# Patient Record
Sex: Male | Born: 1970
Health system: Southern US, Community
[De-identification: ages and names within clinical notes are randomized; demographics above are authoritative.]

## PROBLEM LIST (undated history)

## (undated) DIAGNOSIS — B59 Pneumocystosis: Secondary | ICD-10-CM

## (undated) DIAGNOSIS — A6000 Herpesviral infection of urogenital system, unspecified: Secondary | ICD-10-CM

## (undated) DIAGNOSIS — Z21 Asymptomatic human immunodeficiency virus [HIV] infection status: Secondary | ICD-10-CM

## (undated) DIAGNOSIS — B20 Human immunodeficiency virus [HIV] disease: Secondary | ICD-10-CM

## (undated) DIAGNOSIS — I10 Essential (primary) hypertension: Secondary | ICD-10-CM

## (undated) HISTORY — DX: Human immunodeficiency virus (HIV) disease: B20

## (undated) HISTORY — PX: ANAL EXAMINATION UNDER ANESTHESIA: SHX1138

## (undated) HISTORY — DX: Essential (primary) hypertension: I10

## (undated) HISTORY — DX: Pneumocystosis: B59

---

## 2002-05-27 ENCOUNTER — Emergency Department (HOSPITAL_COMMUNITY): Admission: EM | Admit: 2002-05-27 | Discharge: 2002-05-27 | Payer: Self-pay | Admitting: *Deleted

## 2002-05-30 ENCOUNTER — Emergency Department (HOSPITAL_COMMUNITY): Admission: EM | Admit: 2002-05-30 | Discharge: 2002-05-30 | Payer: Self-pay | Admitting: Internal Medicine

## 2002-06-03 ENCOUNTER — Emergency Department (HOSPITAL_COMMUNITY): Admission: EM | Admit: 2002-06-03 | Discharge: 2002-06-03 | Payer: Self-pay | Admitting: *Deleted

## 2002-06-10 ENCOUNTER — Emergency Department (HOSPITAL_COMMUNITY): Admission: EM | Admit: 2002-06-10 | Discharge: 2002-06-10 | Payer: Self-pay | Admitting: *Deleted

## 2002-06-24 ENCOUNTER — Emergency Department (HOSPITAL_COMMUNITY): Admission: EM | Admit: 2002-06-24 | Discharge: 2002-06-24 | Payer: Self-pay | Admitting: Emergency Medicine

## 2003-08-17 ENCOUNTER — Emergency Department (HOSPITAL_COMMUNITY): Admission: EM | Admit: 2003-08-17 | Discharge: 2003-08-17 | Payer: Self-pay | Admitting: Emergency Medicine

## 2004-06-16 ENCOUNTER — Emergency Department (HOSPITAL_COMMUNITY): Admission: EM | Admit: 2004-06-16 | Discharge: 2004-06-16 | Payer: Self-pay | Admitting: Family Medicine

## 2004-06-17 ENCOUNTER — Emergency Department (HOSPITAL_COMMUNITY): Admission: EM | Admit: 2004-06-17 | Discharge: 2004-06-17 | Payer: Self-pay | Admitting: Family Medicine

## 2004-10-23 ENCOUNTER — Emergency Department (HOSPITAL_COMMUNITY): Admission: EM | Admit: 2004-10-23 | Discharge: 2004-10-23 | Payer: Self-pay | Admitting: Family Medicine

## 2005-06-21 ENCOUNTER — Emergency Department (HOSPITAL_COMMUNITY): Admission: EM | Admit: 2005-06-21 | Discharge: 2005-06-21 | Payer: Self-pay | Admitting: Emergency Medicine

## 2005-06-23 ENCOUNTER — Encounter: Admission: RE | Admit: 2005-06-23 | Discharge: 2005-06-23 | Payer: Self-pay | Admitting: Family Medicine

## 2006-09-10 ENCOUNTER — Encounter: Admission: RE | Admit: 2006-09-10 | Discharge: 2006-09-10 | Payer: Self-pay | Admitting: Family Medicine

## 2012-01-25 ENCOUNTER — Encounter (HOSPITAL_COMMUNITY): Payer: Self-pay | Admitting: *Deleted

## 2012-01-25 ENCOUNTER — Emergency Department (HOSPITAL_COMMUNITY): Payer: BC Managed Care – PPO

## 2012-01-25 ENCOUNTER — Emergency Department (HOSPITAL_COMMUNITY)
Admission: EM | Admit: 2012-01-25 | Discharge: 2012-01-25 | Disposition: A | Discharge status: Home / Self Care | Payer: BC Managed Care – PPO | Attending: Emergency Medicine | Admitting: Emergency Medicine

## 2012-01-25 DIAGNOSIS — K612 Anorectal abscess: Secondary | ICD-10-CM | POA: Insufficient documentation

## 2012-01-25 DIAGNOSIS — F17200 Nicotine dependence, unspecified, uncomplicated: Secondary | ICD-10-CM | POA: Insufficient documentation

## 2012-01-25 DIAGNOSIS — K611 Rectal abscess: Secondary | ICD-10-CM

## 2012-01-25 HISTORY — DX: Herpesviral infection of urogenital system, unspecified: A60.00

## 2012-01-25 LAB — CBC
MCH: 33.3 pg (ref 26.0–34.0)
MCHC: 36.8 g/dL — ABNORMAL HIGH (ref 30.0–36.0)
RDW: 13 % (ref 11.5–15.5)

## 2012-01-25 LAB — BASIC METABOLIC PANEL
Calcium: 9.6 mg/dL (ref 8.4–10.5)
GFR calc Af Amer: >90 mL/min (ref >90)
GFR calc non Af Amer: >90 mL/min (ref >90)
Potassium: 4 mEq/L (ref 3.5–5.1)
Sodium: 136 mEq/L (ref 135–145)

## 2012-01-25 MED ORDER — AMOXICILLIN-POT CLAVULANATE 875-125 MG PO TABS: 1.0000 | ORAL_TABLET | Freq: Two times a day (BID) | ORAL | Status: DC

## 2012-01-25 MED ORDER — IOHEXOL 300 MG/ML  SOLN
100.0000 mL | Freq: Once | INTRAMUSCULAR | Status: AC | PRN
  Administered 2012-01-25: 100 mL via INTRAVENOUS

## 2012-01-25 MED ORDER — SODIUM CHLORIDE 0.9 % IV SOLN
1.0000 g | INTRAVENOUS | Status: DC
  Administered 2012-01-25: 1 g via INTRAVENOUS
  Filled 2012-01-25: qty 1

## 2012-01-25 MED ORDER — METRONIDAZOLE 500 MG PO TABS: 500.0000 mg | ORAL_TABLET | Freq: Two times a day (BID) | ORAL | Status: DC

## 2012-01-25 MED ORDER — METRONIDAZOLE 500 MG PO TABS: 500.0000 mg | ORAL_TABLET | Freq: Two times a day (BID) | ORAL | Status: AC

## 2012-01-25 MED ORDER — MORPHINE SULFATE 4 MG/ML IJ SOLN
4.0000 mg | Freq: Once | INTRAMUSCULAR | Status: AC
  Administered 2012-01-25: 4 mg via INTRAVENOUS
  Filled 2012-01-25: qty 1

## 2012-01-25 MED ORDER — CIPROFLOXACIN HCL 500 MG PO TABS: 500.0000 mg | ORAL_TABLET | Freq: Two times a day (BID) | ORAL | Status: DC

## 2012-01-25 MED ORDER — CIPROFLOXACIN HCL 500 MG PO TABS: 500.0000 mg | ORAL_TABLET | Freq: Two times a day (BID) | ORAL | Status: AC

## 2012-01-25 MED ORDER — HYDROCODONE-ACETAMINOPHEN 5-325 MG PO TABS: 1.0000 | ORAL_TABLET | Freq: Four times a day (QID) | ORAL | Status: AC | PRN

## 2012-01-25 NOTE — ED Notes (Signed)
Pt states went to see PCP 7/31 for rectal pain, was diagnosed w/ hemorrhoid, was given Anusol 2.5% cream and Indomethacin to take and was told to come back if pain didn't decrease in 24 hours, pt states still having severe rectal pain, states when got out of the truck to come inside the ED, he felt something pop and wet, white discharge in underwear and clear/bloody substance on pants.

## 2012-01-25 NOTE — Progress Notes (Signed)
Pt confirmed pcp is elaine griffin updated EPIC

## 2012-01-25 NOTE — ED Provider Notes (Signed)
I spoke to general surgeon who agrees with dispo.  Per RN, pt reports Augmentin causes diarrhea, therefore have changed to Cipro and Flagyl Rx.  Casey Hurley. [name], MD 01/25/12 1739

## 2012-01-25 NOTE — ED Provider Notes (Addendum)
History     CSN: [id number]  Arrival date & time 01/25/12  1254   First MD Initiated Contact with Patient 01/25/12 1416      Chief Complaint  Patient presents with  . Rectal Pain     The history is provided by the patient.  Pt has been having pain in the rectum for the last few days.  He saw his PCP yesterday who prescribed anusol cream and told the patient to come to the ED if it got worse.  Pt states the swelling increased and this am he felt it burst and and noticed yellow discharge in his underwear.  Pt felt feverish today.  No vomiting or diarrhea.  Palpation increases the pain.  Past Medical History  Diagnosis Date  . Genital herpes     History reviewed. No pertinent past surgical history.  History reviewed. No pertinent family history.  History  Substance Use Topics  . Smoking status: Current Everyday Smoker -- 1.0 packs/day    Types: Cigarettes  . Smokeless tobacco: Never Used  . Alcohol Use: 1.2 oz/week    2 Cans of beer per week     daily      Review of Systems  All other systems reviewed and are negative.    Allergies  Review of patient's allergies indicates no known allergies.  Home Medications   Current Outpatient Rx  Name Route Sig Dispense Refill  . HYDROCORTISONE 2.5 % RE CREA Rectal Place 1 application rectally 3 (three) times daily.    . INDOMETHACIN 50 MG PO CAPS Oral Take 50 mg by mouth 3 (three) times daily with meals.    Marland Kitchen VALACYCLOVIR HCL 500 MG PO TABS Oral Take 500 mg by mouth daily.      BP 108/65  Pulse 105  Temp 99.5 F (37.5 C) (Oral)  Resp 16  Ht 5\' 10"  (1.778 m)  Wt 141 lb (63.957 kg)  BMI 20.23 kg/m2  SpO2 100%  Physical Exam  Nursing note and vitals reviewed. Constitutional: He appears well-developed and well-nourished. No distress.  HENT:  Head: Normocephalic and atraumatic.  Right Ear: External ear normal.  Left Ear: External ear normal.  Eyes: Conjunctivae are normal. Right eye exhibits no discharge. Left eye  exhibits no discharge. No scleral icterus.  Neck: Neck supple. No tracheal deviation present.  Cardiovascular: Normal rate.   Murmur heard. Pulmonary/Chest: Effort normal and breath sounds normal. No stridor. No respiratory distress. He has no wheezes.  Abdominal: He exhibits no distension. There is no tenderness. There is no rebound and no guarding.  Genitourinary: Rectal exam shows no fissure.          Area of swelling and tenderness, no fluctuance, small amount of purulent material extenally  Musculoskeletal: He exhibits no edema.  Neurological: He is alert. Cranial nerve deficit: no gross deficits.  Skin: Skin is warm and dry. No rash noted.  Psychiatric: He has a normal mood and affect.    ED Course  Procedures (including critical care time)  Labs Reviewed  CBC - Abnormal; Notable for the following:    WBC 10.9 (*)     MCHC 36.8 (*)     All other components within normal limits  BASIC METABOLIC PANEL - Abnormal; Notable for the following:    Glucose, Bld 100 (*)     All other components within normal limits   Ct Pelvis W Contrast  01/25/2012  *RADIOLOGY REPORT*  Clinical Data:  Rectal pain, drainage  CT PELVIS WITH CONTRAST  Technique:  Multidetector CT imaging of the pelvis was performed using the standard protocol following the bolus administration of intravenous contrast.  Contrast: OMNIPAQUE IOHEXOL 300 MG/ML  SOLN  Comparison:   Prior CT scan of the abdomen and pelvis 08/24/2008.  Findings:  The visualized small and large bowel demonstrate a normal caliber without evidence of dilatation.  Normal appendix identified in the right lower quadrant.  The bladder is distended with urine.  No free fluid, or suspicious pelvic adenopathy. Prostate gland is unremarkable in appearance.  The rectum is unremarkable.  There is asymmetric soft tissue thickening around the anus extending into the medial wall of the left buttock.  No focal fluid collection.  No extension into the pelvis.   No acute osseous abnormality.  IMPRESSION:  Asymmetric soft tissue thickening around the anus extending into the medial subcutaneous adipose tissue of the left buttock consistent with a perianal infectious/inflammatory process.  There is no focal fluid collection to suggest an undrained abscess and no extension of the process into the anatomic pelvis.  Otherwise, unremarkable CT scan of the pelvis.  Original Report Authenticated By: Vilma Prader      MDM  ?ischiotectal abscess that has drained,  ?fistula.  Will ct to evaluate further.  If negative.  Will dc home on abx, follow up with surgery.  CT scan does not show persistent abscess.  Does have inflamation and induration consistent with infection.  Will start on abx.  Refer to surgery for follow up       Celene Kras, MD 01/25/12 1549  Celene Kras, MD 01/28/12 0930

## 2012-01-25 NOTE — ED Notes (Addendum)
Pt from home with reports of rectal pain due to hemorrhoids. Pt reports seeing PCP yesterday for same with Rx given for cream and pain medicine with no relief. Pt also endorses leaking from around rectum that started since pt arrived to ED.

## 2012-01-25 NOTE — ED Notes (Signed)
Pt's prescriptions found and phone called made to home to have patient call back to the ED. 1805

## 2012-01-26 ENCOUNTER — Telehealth (INDEPENDENT_AMBULATORY_CARE_PROVIDER_SITE_OTHER): Payer: Self-pay

## 2012-01-26 NOTE — Telephone Encounter (Signed)
Called pt to confirm his appt on 8/13 with Dr. Donell Beers.  He is feeling better today since the abscess has drained.  He is taking an antibiotic and pain medication, and sitting in a warm tub for 15 min. 3-4 times a day.  He will call if he has any questions or concerns.

## 2012-02-09 ENCOUNTER — Ambulatory Visit (INDEPENDENT_AMBULATORY_CARE_PROVIDER_SITE_OTHER): Payer: Self-pay | Admitting: General Surgery

## 2012-02-20 ENCOUNTER — Ambulatory Visit (INDEPENDENT_AMBULATORY_CARE_PROVIDER_SITE_OTHER): Payer: Self-pay | Admitting: General Surgery

## 2012-07-03 ENCOUNTER — Encounter (INDEPENDENT_AMBULATORY_CARE_PROVIDER_SITE_OTHER): Payer: Self-pay

## 2012-07-08 ENCOUNTER — Encounter (INDEPENDENT_AMBULATORY_CARE_PROVIDER_SITE_OTHER): Payer: Self-pay | Admitting: Surgery

## 2012-07-08 ENCOUNTER — Ambulatory Visit (INDEPENDENT_AMBULATORY_CARE_PROVIDER_SITE_OTHER): Payer: BC Managed Care – PPO | Admitting: Surgery

## 2012-07-08 VITALS — BP 122/84 | HR 76 | Temp 97.7°F | Resp 12 | Ht 69.0 in | Wt 143.8 lb

## 2012-07-08 DIAGNOSIS — N611 Abscess of the breast and nipple: Secondary | ICD-10-CM | POA: Insufficient documentation

## 2012-07-08 DIAGNOSIS — N61 Mastitis without abscess: Secondary | ICD-10-CM

## 2012-07-08 MED ORDER — DOXYCYCLINE HYCLATE 100 MG PO TABS: 100.0000 mg | ORAL_TABLET | Freq: Two times a day (BID) | ORAL | Status: DC

## 2012-07-08 NOTE — Patient Instructions (Signed)
Complete antibiotics.  Return 2 weeks.  Will need some outpatient surgery once infection clears.

## 2012-07-08 NOTE — Progress Notes (Signed)
Patient ID: Casey Hurley, male   DOB: April 13, 1971, 42 y.o.   MRN: [id number]  No chief complaint on file.   HPI Casey Hurley is a 42 y.o. male.  Patient sent at the request of Dr. Doristine Locks or right breast abscess. One week ago he noticed some redness involving the right nipple. The patient saw Dr. Doristine Locks aspirated what sounds like pus from the medial aspect of the right nipple. He has been placed on antibiotics and this area is improved. He has a history of a similar process involving left nipple one year ago. He performed a self incision and drainage last year this process and it resolved except for a chronic draining from the left nipple. He has a history of a perirectal abscess a result on its own  in August 2013. No history of fever or chills. HPI  Past Medical History  Diagnosis Date  . Genital herpes   . Hypertension     No past surgical history on file.  Family History  Problem Relation Age of Onset  . Breast cancer    . Prostate cancer Father     Social History History  Substance Use Topics  . Smoking status: Current Every Day Smoker -- 1.0 packs/day    Types: Cigarettes  . Smokeless tobacco: Never Used  . Alcohol Use: 1.2 oz/week    2 Cans of beer per week     Comment: daily    No Known Allergies  Current Outpatient Prescriptions  Medication Sig Dispense Refill  . valACYclovir (VALTREX) 500 MG tablet Take 500 mg by mouth daily.      Marland Kitchen ALPRAZolam (XANAX) 0.5 MG tablet Take 0.5 mg by mouth at bedtime as needed.      . doxycycline (VIBRA-TABS) 100 MG tablet Take 1 tablet (100 mg total) by mouth 2 (two) times daily. For infection  28 tablet  0    Review of Systems Review of Systems  Constitutional: Negative for fever and chills.  HENT: Negative.   Eyes: Negative.   Respiratory: Negative.   Cardiovascular: Negative.   Psychiatric/Behavioral: Negative.     Blood pressure 122/84, pulse 76, temperature 97.7 F (36.5 C), resp. rate 12, height 5\' 9"  (1.753  m), weight 143 lb 12.8 oz (65.227 kg).  Physical Exam Physical Exam  Constitutional: He is oriented to person, place, and time. He appears well-developed and well-nourished.  HENT:  Head: Normocephalic and atraumatic.  Eyes: EOM are normal. Pupils are equal, round, and reactive to light.  Neck: Normal range of motion. Neck supple.  Pulmonary/Chest:    Musculoskeletal: Normal range of motion.  Neurological: He is alert and oriented to person, place, and time.  Skin: Skin is warm and dry.      Assessment    Abscess /  Cellulitis right breast with history of left breast abscess    Plan    Continue antibiotics.  No need for I and D at this point.  Will need outpatient excision once acute infection clears.  Doxycycline for 2 weeks and return to office.   Has a chronic sinus tract involving medial left nipple present since January 2013.        Iszabella Hebenstreit A. 07/08/2012, 11:40 AM

## 2012-07-29 ENCOUNTER — Ambulatory Visit (INDEPENDENT_AMBULATORY_CARE_PROVIDER_SITE_OTHER): Payer: BC Managed Care – PPO | Admitting: Surgery

## 2012-07-29 ENCOUNTER — Encounter (INDEPENDENT_AMBULATORY_CARE_PROVIDER_SITE_OTHER): Payer: Self-pay | Admitting: Surgery

## 2012-07-29 VITALS — BP 122/80 | HR 72 | Temp 98.1°F | Resp 12 | Ht 69.0 in | Wt 147.0 lb

## 2012-07-29 DIAGNOSIS — N6019 Diffuse cystic mastopathy of unspecified breast: Secondary | ICD-10-CM

## 2012-07-29 NOTE — Patient Instructions (Signed)
Schedule for bilateral breast debridement/ removal of tissue.

## 2012-07-29 NOTE — Progress Notes (Signed)
Patient ID: Casey Hurley, male   DOB: [birth year], 42 y.o.   MRN: [id number]  No chief complaint on file.   HPI Casey Hurley is a 42 y.o. male.  Patient sent at the request of Dr. Doristine Locks or right breast abscess. One week ago he noticed some redness involving the right nipple. The patient saw Dr. Doristine Locks aspirated what sounds like pus from the medial aspect of the right nipple. He has been placed on antibiotics and this area is improved. He has a history of a similar process involving left nipple one year ago. He performed a self incision and drainage last year this process and it resolved except for a chronic draining from the left nipple. He has a history of a perirectal abscess a result on its own  in August 2013. No history of fever or chills. HPI  Past Medical History  Diagnosis Date  . Genital herpes   . Hypertension     No past surgical history on file.  Family History  Problem Relation Age of Onset  . Breast cancer    . Prostate cancer Father     Social History History  Substance Use Topics  . Smoking status: Current Every Day Smoker -- 1.0 packs/day    Types: Cigarettes  . Smokeless tobacco: Never Used  . Alcohol Use: 1.2 oz/week    2 Cans of beer per week     Comment: daily    No Known Allergies  Current Outpatient Prescriptions  Medication Sig Dispense Refill  . ALPRAZolam (XANAX) 0.5 MG tablet Take 0.5 mg by mouth at bedtime as needed.      . doxycycline (VIBRA-TABS) 100 MG tablet Take 1 tablet (100 mg total) by mouth 2 (two) times daily. For infection  28 tablet  0  . valACYclovir (VALTREX) 500 MG tablet Take 500 mg by mouth daily.        Review of Systems Review of Systems  Constitutional: Negative for fever and chills.  HENT: Negative.   Eyes: Negative.   Respiratory: Negative.   Cardiovascular: Negative.   Psychiatric/Behavioral: Negative.     Blood pressure 122/80, pulse 72, temperature 98.1 F (36.7 C), resp. rate 12, height 5\' 9"  (1.753  m), weight 147 lb (66.679 kg).  Physical Exam Physical Exam  Constitutional: He is oriented to person, place, and time. He appears well-developed and well-nourished.  HENT:  Head: Normocephalic and atraumatic.  Eyes: EOM are normal. Pupils are equal, round, and reactive to light.  Neck: Normal range of motion. Neck supple.  Pulmonary/Chest:  Redness right breast resolved bilateral nipple sinus tracts noted.  Musculoskeletal: Normal range of motion.  Neurological: He is alert and oriented to person, place, and time.  Skin: Skin is warm and dry.      Assessment    Chronic mastitis  bliaterl breast with history of left breast abscess and nipple sinus tracts    Plan      Recommend bilateral debridement of nipple region.  The procedure has been discussed with the patient.  Alternative therapies have been discussed with the patient.  Operative risks include bleeding,  Infection,  Organ injury,  Nerve injury,  Blood vessel injury,  DVT,  Pulmonary embolism,  Death,  And possible reoperation.  Medical management risks include worsening of present situation.  The success of the procedure is 50 -90 % at treating patients symptoms.  The patient understands and agrees to proceed.    Lylia Karn A. 07/29/2012, 11:59 AM

## 2013-11-10 ENCOUNTER — Ambulatory Visit (INDEPENDENT_AMBULATORY_CARE_PROVIDER_SITE_OTHER): Payer: BC Managed Care – PPO | Admitting: Surgery

## 2013-11-10 ENCOUNTER — Encounter (INDEPENDENT_AMBULATORY_CARE_PROVIDER_SITE_OTHER): Payer: Self-pay | Admitting: Surgery

## 2013-11-10 VITALS — BP 130/76 | HR 97 | Temp 97.8°F | Ht 71.0 in | Wt 140.0 lb

## 2013-11-10 DIAGNOSIS — A630 Anogenital (venereal) warts: Secondary | ICD-10-CM | POA: Insufficient documentation

## 2013-11-10 NOTE — Patient Instructions (Signed)
Genital Warts Genital warts are a sexually transmitted infection. They may appear as small bumps on the tissues of the genital area. CAUSES  Genital warts are caused by a virus called human papillomavirus (HPV). HPV is the most common sexually transmitted disease (STD) and infection of the sex organs. This infection is spread by having unprotected sex with an infected person. It can be spread by vaginal, anal, and oral sex. Many people do not know they are infected. They may be infected for years without problems. However, even if they do not have problems, they can unknowingly pass the infection to their sexual partners. SYMPTOMS   Itching and irritation in the genital area.  Warts that bleed.  Painful sexual intercourse. DIAGNOSIS  Warts are usually recognized with the naked eye on the vagina, vulva, perineum, anus, and rectum. Certain tests can also diagnose genital warts, such as:  A Pap test.  A tissue sample (biopsy) exam.  Colposcopy. A magnifying tool is used to examine the vagina and cervix. The HPV cells will change color when certain solutions are used. TREATMENT  Warts can be removed by:  Applying certain chemicals, such as cantharidin or podophyllin.  Liquid nitrogen freezing (cryotherapy).  Immunotherapy with candida or trichophyton injections.  Laser treatment.  Burning with an electrified probe (electrocautery).  Interferon injections.  Surgery. PREVENTION  HPV vaccination can help prevent HPV infections that cause genital warts and that cause cancer of the cervix. It is recommended that the vaccination be given to people between the ages 9 to 26 years old. The vaccine might not work as well or might not work at all if you already have HPV. It should not be given to pregnant women. HOME CARE INSTRUCTIONS   It is important to follow your caregiver's instructions. The warts will not go away without treatment. Repeat treatments are often needed to get rid of warts.  Even after it appears that the warts are gone, the normal tissue underneath often remains infected.  Do not try to treat genital warts with medicine used to treat hand warts. This type of medicine is strong and can burn the skin in the genital area, causing more damage.  Tell your past and current sexual partner(s) that you have genital warts. They may be infected also and need treatment.  Avoid sexual contact while being treated.  Do not touch or scratch the warts. The infection may spread to other parts of your body.  Women with genital warts should have a cervical cancer check (Pap test) at least once a year. This type of cancer is slow-growing and can be cured if found early. Chances of developing cervical cancer are increased with HPV.  Inform your obstetrician about your warts in the event of pregnancy. This virus can be passed to the baby's respiratory tract. Discuss this with your caregiver.  Use a condom during sexual intercourse. Following treatment, the use of condoms will help prevent reinfection.  Ask your caregiver about using over-the-counter anti-itch creams. SEEK MEDICAL CARE IF:   Your treated skin becomes red, swollen, or painful.  You have a fever.  You feel generally ill.  You feel little lumps in and around your genital area.  You are bleeding or have painful sexual intercourse. MAKE SURE YOU:   Understand these instructions.  Will watch your condition.  Will get help right away if you are not doing well or get worse. Document Released: 06/09/2000 Document Revised: 09/04/2011 Document Reviewed: 12/19/2010 [employer] Patient Information 2014 [employer].  

## 2013-11-10 NOTE — Progress Notes (Signed)
Patient ID: Casey Hurley, male   DOB: December 29, 1970, 43 y.o.   MRN: [id number]  Chief Complaint  Patient presents with  . anal warts    HPI Casey SeminoleBenjamin E Schuyler is a 43 y.o. male.  Patient presents with chief complaint of perianal itching and swelling. This has been present for a number of months. Bowel function has for the most part normal. He does have some drainage issues from his rectum. Occasional bleeding.Pt feels some  Nodularity in anal canal.  HPI  Past Medical History  Diagnosis Date  . Genital herpes   . Hypertension     History reviewed. No pertinent past surgical history.  Family History  Problem Relation Age of Onset  . Breast cancer    . Prostate cancer Father     Social History History  Substance Use Topics  . Smoking status: Current Every Day Smoker -- 1.00 packs/day    Types: Cigarettes  . Smokeless tobacco: Never Used  . Alcohol Use: 1.2 oz/week    2 Cans of beer per week     Comment: daily    No Known Allergies  Current Outpatient Prescriptions  Medication Sig Dispense Refill  . ALPRAZolam (XANAX) 0.5 MG tablet Take 0.5 mg by mouth at bedtime as needed.      . valACYclovir (VALTREX) 500 MG tablet Take 500 mg by mouth daily.       No current facility-administered medications for this visit.    Review of Systems Review of Systems  Constitutional: Negative.   HENT: Negative.   Eyes: Negative.   Respiratory: Negative.   Cardiovascular: Negative.   Gastrointestinal: Positive for anal bleeding. Negative for blood in stool and rectal pain.  Endocrine: Negative.   Genitourinary: Negative.   Skin: Negative.   Allergic/Immunologic: Negative.   Neurological: Negative.   Hematological: Negative.   Psychiatric/Behavioral: Negative.     Blood pressure 130/76, pulse 97, temperature 97.8 F (36.6 C), height 5\' 11"  (1.803 m), weight 140 lb (63.504 kg).  Physical Exam Physical Exam  Constitutional: He is oriented to person, place, and time. He appears  well-developed and well-nourished.  HENT:  Head: Normocephalic and atraumatic.  Eyes: Pupils are equal, round, and reactive to light. No scleral icterus.  Genitourinary:     Neurological: He is alert and oriented to person, place, and time.  Skin: Skin is warm and dry.  Psychiatric: He has a normal mood and affect. His behavior is normal. Judgment and thought content normal.    Data Reviewed none  Assessment    Anal condyloma    Plan    Exam under anesthesia and destruction of anal condyloma Discussed risk of anal canal carcinoma. Discussed possible long term follow up.   The procedure has been discussed with the patient.  Alternative therapies have been discussed with the patient.  Operative risks include bleeding,  Infection,  Organ injury, incontinence  Nerve injury,  Blood vessel injury,  DVT,  Pulmonary embolism,  Death,  And possible reoperation.  Medical management risks include worsening of present situation.  The success of the procedure is 50 -90 % at treating patients symptoms.  The patient understands and agrees to proceed.       Tonya Carlile A. Graham Hyun 11/10/2013, 3:01 PM

## 2013-11-19 ENCOUNTER — Other Ambulatory Visit (INDEPENDENT_AMBULATORY_CARE_PROVIDER_SITE_OTHER): Payer: Self-pay

## 2013-11-19 ENCOUNTER — Telehealth (INDEPENDENT_AMBULATORY_CARE_PROVIDER_SITE_OTHER): Payer: Self-pay

## 2013-11-19 DIAGNOSIS — A630 Anogenital (venereal) warts: Secondary | ICD-10-CM

## 2013-11-19 NOTE — Telephone Encounter (Signed)
Informed pt that orders was sent to [facility]. Informed pt that we would like for him to have these drawn ASAP so that we can get these labs faxed to the [facility]. Pt states that he will go by [employer] this afternoon and he will call us after he has left from there.

## 2013-11-20 LAB — CBC WITH DIFFERENTIAL/PLATELET
BASOS: 0 %
Basophils Absolute: 0 10*3/uL (ref 0.0–0.2)
EOS: 11 %
Eosinophils Absolute: 0.5 10*3/uL — ABNORMAL HIGH (ref 0.0–0.4)
HEMATOCRIT: 42 % (ref 37.5–51.0)
Hemoglobin: 15.3 g/dL (ref 12.6–17.7)
LYMPHS ABS: 2 10*3/uL (ref 0.7–3.1)
Lymphs: 47 %
MCH: 33.8 pg — ABNORMAL HIGH (ref 26.6–33.0)
MCHC: 36.4 g/dL — ABNORMAL HIGH (ref 31.5–35.7)
MCV: 93 fL (ref 79–97)
MONOS ABS: 0.4 10*3/uL (ref 0.1–0.9)
Monocytes: 10 %
Neutrophils Absolute: 1.4 10*3/uL (ref 1.4–7.0)
Neutrophils Relative %: 32 %
RBC: 4.53 x10E6/uL (ref 4.14–5.80)
RDW: 13.1 % (ref 12.3–15.4)
WBC: 4.3 10*3/uL (ref 3.4–10.8)

## 2013-11-20 LAB — COMPREHENSIVE METABOLIC PANEL
ALK PHOS: 104 IU/L (ref 39–117)
ALT: 18 IU/L (ref 0–44)
AST: 24 IU/L (ref 0–40)
Albumin/Globulin Ratio: 1.2 (ref 1.1–2.5)
Albumin: 4.1 g/dL (ref 3.5–5.5)
BILIRUBIN TOTAL: 0.3 mg/dL (ref 0.0–1.2)
BUN / CREAT RATIO: 12 (ref 9–20)
BUN: 12 mg/dL (ref 6–24)
CHLORIDE: 101 mmol/L (ref 96–108)
CO2: 28 mmol/L (ref 18–29)
Calcium: 9.4 mg/dL (ref 8.7–10.2)
Creatinine, Ser: 0.98 mg/dL (ref 0.76–1.27)
GFR calc non Af Amer: 96 mL/min/{1.73_m2} (ref >59)
GFR, EST AFRICAN AMERICAN: 111 mL/min/{1.73_m2} (ref >59)
GLUCOSE: 89 mg/dL (ref 65–99)
Globulin, Total: 3.4 g/dL (ref 1.5–4.5)
POTASSIUM: 4.1 mmol/L (ref 3.5–5.2)
Sodium: 138 mmol/L (ref 134–144)
Total Protein: 7.5 g/dL (ref 6.0–8.5)

## 2013-11-27 ENCOUNTER — Other Ambulatory Visit (INDEPENDENT_AMBULATORY_CARE_PROVIDER_SITE_OTHER): Payer: Self-pay | Admitting: Surgery

## 2013-11-27 DIAGNOSIS — A630 Anogenital (venereal) warts: Secondary | ICD-10-CM

## 2013-11-28 ENCOUNTER — Telehealth (INDEPENDENT_AMBULATORY_CARE_PROVIDER_SITE_OTHER): Payer: Self-pay

## 2013-11-28 ENCOUNTER — Other Ambulatory Visit (INDEPENDENT_AMBULATORY_CARE_PROVIDER_SITE_OTHER): Payer: Self-pay

## 2013-11-28 MED ORDER — LIDOCAINE HCL 2 % EX GEL
CUTANEOUS | Status: AC
Start: 2013-11-28 — End: 2014-11-28

## 2013-11-28 MED ORDER — TRAMADOL HCL 50 MG PO TABS: 50.0000 mg | ORAL_TABLET | Freq: Four times a day (QID) | ORAL | Status: DC | PRN

## 2013-11-28 MED ORDER — OXYCODONE-ACETAMINOPHEN 5-325 MG PO TABS: 1.0000 | ORAL_TABLET | Freq: Four times a day (QID) | ORAL | Status: DC | PRN

## 2013-11-28 NOTE — Telephone Encounter (Signed)
Pt s/p destruction of anal condyloma on 11/27/13. Pts wife states that pt is experiencing burning when he urinates. Advised pt that during surgery a catheter was placed and this can cause some irritation. Advised pt that this some go away in time. Advised wife that he should be going to the bathroom at least every 6- 8 hours and to make sure that he is emptying his bladder. Advised pt that if he feels that he can not empty his bladder or he has a decrease in urine to give [place] a call back. Informed pt that I would let Dr Luisa Hart of his concerns as well. Pt verbalized understanding and agrees with POC.

## 2013-11-28 NOTE — Telephone Encounter (Signed)
Pt did not have catheter.  Drink plenty of fluids.  If this continues,  He will need to be check at Urgent care for UTI.

## 2013-11-28 NOTE — Telephone Encounter (Signed)
Called pt to let him know that he did not have a catheter during surgery. Wife states that he is doing much better now and if this arises again they would go to the Urgent Care to be evaluated for UTI.

## 2013-12-02 ENCOUNTER — Telehealth (INDEPENDENT_AMBULATORY_CARE_PROVIDER_SITE_OTHER): Payer: Self-pay | Admitting: General Surgery

## 2013-12-02 ENCOUNTER — Telehealth (INDEPENDENT_AMBULATORY_CARE_PROVIDER_SITE_OTHER): Payer: Self-pay

## 2013-12-02 NOTE — Telephone Encounter (Signed)
Patient's wife called in to let us know that her husband is still soaking through the pads that he is using for rectal bleeding s/p condyloma ablation.  Informed her that they could use a barrier cream around the outside of the rectum to help with this but they should avoid the actual incision sites and the rectum.  Then I advised her that he should tuck some gauze up against the rectum to help prevent the irritation while still catching the drainage.  She explained they would try this and see how it works.

## 2013-12-02 NOTE — Telephone Encounter (Signed)
Pt s/p destruction of anal condyloma on 11/27/13. Pt is requesting a refill on his Oxcodone 5/325mg . Pt rates his pain a 5-6 at this time. Pt has been taking Ibuprofen laso for pain relief. Pt states that Tramadol has not been working for him. Informed pt that I would send Dr Luisa Hart a message requesting his refill. Informed pt that with it being this late if Dr Luisa Hart authorizes a refill it maybe tomorrow, and they would have to pick up Rx in the office. Pt verbalized understanding and agrees with POC.

## 2013-12-02 NOTE — Telephone Encounter (Signed)
Refill is ok

## 2013-12-03 ENCOUNTER — Other Ambulatory Visit (INDEPENDENT_AMBULATORY_CARE_PROVIDER_SITE_OTHER): Payer: Self-pay

## 2013-12-03 MED ORDER — OXYCODONE-ACETAMINOPHEN 5-325 MG PO TABS: 1.0000 | ORAL_TABLET | Freq: Four times a day (QID) | ORAL | Status: DC | PRN

## 2013-12-03 NOTE — Telephone Encounter (Signed)
Informed pt Rx for Oxycodone 5/325mg  was ready for him to pick up at our front office. Pt verbalized understanding.

## 2013-12-05 ENCOUNTER — Encounter (INDEPENDENT_AMBULATORY_CARE_PROVIDER_SITE_OTHER): Payer: Self-pay | Admitting: Surgery

## 2013-12-05 ENCOUNTER — Telehealth (INDEPENDENT_AMBULATORY_CARE_PROVIDER_SITE_OTHER): Payer: Self-pay | Admitting: General Surgery

## 2013-12-05 ENCOUNTER — Other Ambulatory Visit (INDEPENDENT_AMBULATORY_CARE_PROVIDER_SITE_OTHER): Payer: Self-pay | Admitting: Surgery

## 2013-12-05 NOTE — Telephone Encounter (Signed)
Pt called to ask if there is anything else to try for severe, stinging pain that "brings tears to the eyes."  He is using the warm bath soaks and the issued pain medication appropriately.  Commiserated with him and reminded him of the time it requires for healing to take place.  He wants to ask Dr. Luisa Hartornett for his imput as well.  Please advise.

## 2013-12-05 NOTE — Telephone Encounter (Signed)
Its going to hurt for a while can call in some ativan 1 mg tabs number 10 one every 6 hours as needed for anxiety and see if this helps.

## 2013-12-05 NOTE — Telephone Encounter (Signed)
Patient called back and want to know about the Rx he called in earlier. I seen where Dr. Gwendel Hansonorrett stated that the patient can have Ativan 1 mg tab one every 6 hours with no refill for anxiety

## 2013-12-11 ENCOUNTER — Encounter (INDEPENDENT_AMBULATORY_CARE_PROVIDER_SITE_OTHER): Payer: Self-pay | Admitting: Surgery

## 2013-12-11 ENCOUNTER — Ambulatory Visit (INDEPENDENT_AMBULATORY_CARE_PROVIDER_SITE_OTHER): Payer: BC Managed Care – PPO | Admitting: Surgery

## 2013-12-11 VITALS — BP 118/65 | HR 91 | Temp 98.3°F | Resp 16 | Ht 71.0 in | Wt 133.8 lb

## 2013-12-11 DIAGNOSIS — Z9889 Other specified postprocedural states: Secondary | ICD-10-CM

## 2013-12-11 MED ORDER — OXYCODONE-ACETAMINOPHEN 5-325 MG PO TABS: 1.0000 | ORAL_TABLET | Freq: Four times a day (QID) | ORAL | Status: DC | PRN

## 2013-12-11 MED ORDER — ALPRAZOLAM 0.5 MG PO TABS: 0.5000 mg | ORAL_TABLET | Freq: Every evening | ORAL | Status: DC | PRN

## 2013-12-11 NOTE — Progress Notes (Signed)
Patient returns after excision of anal condyloma. His pain is improving but has been severe at times. He does have diarrhea after meals. He stopped taking the tramadol and MiraLAX. The Ativan has helped his pain. He states feeling better though.  Exam: Anal canal is clean. Wounds are healing well. Minimal fibrinous exudate.  Pathology: Condyloma with atypia  Impression: Significant anal condyloma status post excision  Plan: Followup 1 month. Back to work in 10 days. Refilled Percocet prescription and Ativan prescription for pain control. Stop taking tramadol and MiraLAX. Increase bulk in  diet and may try one half tablet of loperamide. Call if diarrhea does not resolve in the next few days.

## 2013-12-11 NOTE — Patient Instructions (Signed)
May apply neosporin to area as needed.  Return 1 month.

## 2014-01-12 ENCOUNTER — Encounter (INDEPENDENT_AMBULATORY_CARE_PROVIDER_SITE_OTHER): Payer: Self-pay | Admitting: Surgery

## 2014-01-12 ENCOUNTER — Ambulatory Visit (INDEPENDENT_AMBULATORY_CARE_PROVIDER_SITE_OTHER): Payer: BC Managed Care – PPO | Admitting: Surgery

## 2014-01-12 VITALS — BP 138/90 | HR 84 | Temp 97.0°F | Resp 14 | Ht 71.0 in | Wt 134.6 lb

## 2014-01-12 DIAGNOSIS — Z9889 Other specified postprocedural states: Secondary | ICD-10-CM

## 2014-01-12 NOTE — Progress Notes (Signed)
Patient returns after excision of anal condyloma. His pain is improving. He states feeling better though.  Exam: Anal canal is clean. Wounds are healing well. Minimal fibrinous exudate.  Pathology: Condyloma with atypia  Impression: Significant anal condyloma status post excision  Plan: much better.  Recommend long term follow up with colorectal specialist.  RTC 6 months to [facility] Dr Winn [facility]  Apply powder to area as needed for moisture control.

## 2014-01-12 NOTE — Patient Instructions (Signed)
May apply talc powder to area for moisture.  Can use benadryl  Or neosporin to area.  Will return in 6 months to follow up with colorectal specialist.

## 2014-08-13 ENCOUNTER — Ambulatory Visit
Admission: RE | Admit: 2014-08-13 | Discharge: 2014-08-13 | Disposition: A | Discharge status: Home / Self Care | Payer: BC Managed Care – PPO | Source: Ambulatory Visit | Attending: Family Medicine | Admitting: Family Medicine

## 2014-08-13 ENCOUNTER — Other Ambulatory Visit: Payer: Self-pay | Admitting: Family Medicine

## 2014-08-13 DIAGNOSIS — R05 Cough: Secondary | ICD-10-CM

## 2014-08-13 DIAGNOSIS — R059 Cough, unspecified: Secondary | ICD-10-CM

## 2014-08-13 DIAGNOSIS — R509 Fever, unspecified: Secondary | ICD-10-CM

## 2014-08-23 ENCOUNTER — Inpatient Hospital Stay (HOSPITAL_COMMUNITY)
Admission: EM | Admit: 2014-08-23 | Discharge: 2014-08-28 | DRG: 974 | Disposition: A | Discharge status: Home / Self Care | Payer: BC Managed Care – PPO | Attending: Internal Medicine | Admitting: Internal Medicine

## 2014-08-23 ENCOUNTER — Emergency Department (HOSPITAL_COMMUNITY): Payer: BC Managed Care – PPO

## 2014-08-23 DIAGNOSIS — B59 Pneumocystosis: Secondary | ICD-10-CM | POA: Insufficient documentation

## 2014-08-23 DIAGNOSIS — I10 Essential (primary) hypertension: Secondary | ICD-10-CM | POA: Diagnosis present

## 2014-08-23 DIAGNOSIS — B20 Human immunodeficiency virus [HIV] disease: Principal | ICD-10-CM | POA: Diagnosis present

## 2014-08-23 DIAGNOSIS — R0609 Other forms of dyspnea: Secondary | ICD-10-CM

## 2014-08-23 DIAGNOSIS — J9621 Acute and chronic respiratory failure with hypoxia: Secondary | ICD-10-CM | POA: Insufficient documentation

## 2014-08-23 DIAGNOSIS — Z21 Asymptomatic human immunodeficiency virus [HIV] infection status: Secondary | ICD-10-CM

## 2014-08-23 DIAGNOSIS — Z8619 Personal history of other infectious and parasitic diseases: Secondary | ICD-10-CM

## 2014-08-23 DIAGNOSIS — J9601 Acute respiratory failure with hypoxia: Secondary | ICD-10-CM | POA: Diagnosis present

## 2014-08-23 DIAGNOSIS — N529 Male erectile dysfunction, unspecified: Secondary | ICD-10-CM | POA: Diagnosis present

## 2014-08-23 DIAGNOSIS — A6000 Herpesviral infection of urogenital system, unspecified: Secondary | ICD-10-CM | POA: Diagnosis present

## 2014-08-23 DIAGNOSIS — J189 Pneumonia, unspecified organism: Secondary | ICD-10-CM | POA: Diagnosis present

## 2014-08-23 DIAGNOSIS — R0602 Shortness of breath: Secondary | ICD-10-CM | POA: Diagnosis not present

## 2014-08-23 DIAGNOSIS — G4700 Insomnia, unspecified: Secondary | ICD-10-CM | POA: Diagnosis present

## 2014-08-23 DIAGNOSIS — R000 Tachycardia, unspecified: Secondary | ICD-10-CM | POA: Insufficient documentation

## 2014-08-23 DIAGNOSIS — J449 Chronic obstructive pulmonary disease, unspecified: Secondary | ICD-10-CM | POA: Diagnosis present

## 2014-08-23 DIAGNOSIS — F17210 Nicotine dependence, cigarettes, uncomplicated: Secondary | ICD-10-CM | POA: Diagnosis present

## 2014-08-23 LAB — BASIC METABOLIC PANEL
Anion gap: 12 (ref 5–15)
BUN: 10 mg/dL (ref 6–23)
CALCIUM: 9.1 mg/dL (ref 8.4–10.5)
CHLORIDE: 102 mmol/L (ref 96–112)
CO2: 23 mmol/L (ref 19–32)
Creatinine, Ser: 1.08 mg/dL (ref 0.50–1.35)
GFR calc Af Amer: >90 mL/min (ref >90)
GFR, EST NON AFRICAN AMERICAN: 84 mL/min — AB (ref >90)
Glucose, Bld: 112 mg/dL — ABNORMAL HIGH (ref 70–99)
POTASSIUM: 3.3 mmol/L — AB (ref 3.5–5.1)
SODIUM: 137 mmol/L (ref 135–145)

## 2014-08-23 LAB — CBC
HCT: 42.5 % (ref 39.0–52.0)
HEMOGLOBIN: 15.2 g/dL (ref 13.0–17.0)
MCH: 32.1 pg (ref 26.0–34.0)
MCHC: 35.8 g/dL (ref 30.0–36.0)
MCV: 89.7 fL (ref 78.0–100.0)
Platelets: 243 10*3/uL (ref 150–400)
RBC: 4.74 MIL/uL (ref 4.22–5.81)
RDW: 12 % (ref 11.5–15.5)
WBC: 6.1 10*3/uL (ref 4.0–10.5)

## 2014-08-23 LAB — I-STAT TROPONIN, ED: TROPONIN I, POC: 0 ng/mL (ref 0.00–0.08)

## 2014-08-23 LAB — BRAIN NATRIURETIC PEPTIDE: B NATRIURETIC PEPTIDE 5: 9.8 pg/mL (ref 0.0–100.0)

## 2014-08-23 LAB — I-STAT CG4 LACTIC ACID, ED: LACTIC ACID, VENOUS: 0.79 mmol/L (ref 0.5–2.0)

## 2014-08-23 MED ORDER — DEXTROSE 5 % IV SOLN
2.0000 g | Freq: Once | INTRAVENOUS | Status: AC
  Administered 2014-08-23: 2 g via INTRAVENOUS
  Filled 2014-08-23: qty 2

## 2014-08-23 MED ORDER — IOHEXOL 350 MG/ML SOLN
100.0000 mL | Freq: Once | INTRAVENOUS | Status: AC | PRN
  Administered 2014-08-23: 100 mL via INTRAVENOUS

## 2014-08-23 MED ORDER — SODIUM CHLORIDE 0.9 % IV BOLUS (SEPSIS)
1000.0000 mL | Freq: Once | INTRAVENOUS | Status: AC
  Administered 2014-08-23: 1000 mL via INTRAVENOUS

## 2014-08-23 MED ORDER — VANCOMYCIN HCL IN DEXTROSE 1-5 GM/200ML-% IV SOLN
1000.0000 mg | Freq: Once | INTRAVENOUS | Status: AC
  Administered 2014-08-24: 1000 mg via INTRAVENOUS
  Filled 2014-08-23: qty 200

## 2014-08-23 NOTE — H&P (Signed)
[facility] History and Physical  Patient: Casey Hurley  MRN: [id number]  DOB: [birth year]  DOS: the patient was seen and examined on 08/23/2014 PCP: Astrid DivineGRIFFIN,ELAINE COLLINS, MD  Chief Complaint: Cough and shortness of breath  HPI: Casey Hurley is a 44 y.o. male with Past medical history of hypertension, genital herpes. The patient is presenting with complaints of cough and shortness of breath. Patient mentions that since December he has been having progressively worsening cough associated with shortness of breath and low-grade fever. He denies any significant weight loss. With this he was seen by his PCP who placed him on antibiotics for 2 courses in December. His symptoms improved and he did not have any complaints in January. Earlier in February in the beginning his started having complaints of cough, generalized malaise, shortness of breath, dizziness with low-grade fever persisting on a daily basis. He was seen at an urgent care clinic and was also seen by his PCP and has completed 2 more courses of antibiotic. Patient has completed doxycycline, Levaquin, Augmentin, azithromycin courses of antibiotics over this period. Despite this his symptoms has not improved and he is currently on Levaquin for 3 days and has presented here for further workup. He continues to complain of low-grade fever denies any significant weight loss denies any diarrhea and denies any travel outside of [place]. In [place] and denies any visit to caves. He also was smoking cigarettes until December and in December his switched to be Vaping, but mentions he was feeling bad even prior to switching to Vaping. He denies use of any other chemicals. He is working and he was easy and has exposure to car chemicals. He has been in the same work since 2008. He denies any exposure to pesticides or other chemicals. He denies any alcohol abuse, drug abuse.  The patient is coming  from home. And at his baseline independent for most of his ADL.  Review of Systems: as mentioned in the history of present illness.  A Comprehensive review of the other systems is negative.  Past Medical History  Diagnosis Date  . Genital herpes   . Hypertension    Past Surgical History  Procedure Laterality Date  . Anal examination under anesthesia      anal condylomas   Social History:  reports that he has been smoking Cigarettes.  He has been smoking about 1.00 pack per day. He has never used smokeless tobacco. He reports that he drinks about 1.2 oz of alcohol per week. He reports that he does not use illicit drugs.  No Known Allergies  Family History  Problem Relation Age of Onset  . Breast cancer    . Prostate cancer Father     Prior to Admission medications   Medication Sig Start Date End Date Taking? Authorizing Provider  ALPRAZolam Prudy Feeler(XANAX) 0.5 MG tablet Take 1 tablet (0.5 mg total) by mouth at bedtime as needed. 12/11/13  Yes Harriette Bouillonhomas Cornett, MD  levofloxacin (LEVAQUIN) 750 MG tablet Take 1 tablet by mouth daily. For 10 days 08/20/14  Yes Historical Provider, MD  valACYclovir (VALTREX) 500 MG tablet Take 500 mg by mouth daily as needed (flare ups).    Yes Historical Provider, MD  lidocaine (XYLOCAINE JELLY) 2 % jelly Apply to affected area daily prn Patient not taking: Reported on 08/23/2014 11/28/13 11/28/14  Harriette Bouillonhomas Cornett, MD    Physical Exam: Filed Vitals:   08/23/14 2000 08/23/14 2134 08/23/14 2246 08/23/14 2248  BP: 132/92  133/89  126/84  Pulse: 129 106  114  Temp: 97.7 F (36.5 C)  99.9 F (37.7 C)   TempSrc: Oral  Rectal   Resp: Height:  (1.753 m)     Weight: 65.772 kg (145 lb)     SpO2: 97% 99%  98%    General: Alert, Awake and Oriented to Time, Place and Person. Appear in mild distress Eyes: PERRL ENT: Oral Mucosa clear moist. Neck: no JVD Cardiovascular: S1 and S2 Present, no Murmur, Peripheral Pulses Present Respiratory: Bilateral Air  entry equal and Decreased, bilateral basal Crackles, no wheezes Abdomen: Bowel Sound presentoft and no tender Skin: no Rash Extremities: no Pedal edema, no calf tenderness Neurologic: Grossly no focal neuro deficit.  Labs on Admission:  CBC:  Recent Labs Lab 08/23/14 2014  WBC 6.1  HGB 15.2  HCT 42.5  MCV 89.7  PLT 243    CMP     Component Value Date/Time   NA 137 08/23/2014 2014   NA 138 11/19/2013 1600   K 3.3* 08/23/2014 2014   CL 102 08/23/2014 2014   CO2 23 08/23/2014 2014   GLUCOSE 112* 08/23/2014 2014   GLUCOSE 89 11/19/2013 1600   BUN 10 08/23/2014 2014   BUN 12 11/19/2013 1600   CREATININE 1.08 08/23/2014 2014   CALCIUM 9.1 08/23/2014 2014   PROT 7.5 11/19/2013 1600   AST 24 11/19/2013 1600   ALT 18 11/19/2013 1600   ALKPHOS 104 11/19/2013 1600   BILITOT 0.3 11/19/2013 1600   GFRNONAA 84* 08/23/2014 2014   GFRAA >90 08/23/2014 2014    No results for input(s): LIPASE, AMYLASE in the last 168 hours.  No results for input(s): CKTOTAL, CKMB, CKMBINDEX, TROPONINI in the last 168 hours. BNP (last 3 results)  Recent Labs  08/23/14 2014  BNP 9.8    ProBNP (last 3 results) No results for input(s): PROBNP in the last 8760 hours.   Radiological Exams on Admission: Dg Chest 2 View  08/23/2014   CLINICAL DATA:  Bilateral lower chest pain.  Bilateral pneumonia.  EXAM: CHEST  2 VIEW  COMPARISON:  08/13/2014  FINDINGS: Hyperinflation of the lungs. Increased opacities in both lower lung fields. This is concerning for pneumonia. This has increased slightly since prior study. No effusions. Heart is normal size. No acute bony abnormality.  IMPRESSION: Increasing bilateral lower lobe airspace opacities concerning for pneumonia.   Electronically Signed   By: Charlett Nose M.D.   On: 08/23/2014 20:35   Ct Angio Chest Pe W/cm &/or Wo Cm  08/23/2014   CLINICAL DATA:  Increasing heart rate. Currently under therapy for pneumonia.  EXAM: CT ANGIOGRAPHY CHEST WITH CONTRAST   TECHNIQUE: Multidetector CT imaging of the chest was performed using the standard protocol during bolus administration of intravenous contrast. Multiplanar CT image reconstructions and MIPs were obtained to evaluate the vascular anatomy.  CONTRAST:  OMNIPAQUE IOHEXOL 350 MG/ML SOLN  COMPARISON:  None.  FINDINGS: THORACIC INLET/BODY WALL:  No acute abnormality.  MEDIASTINUM:  Normal heart size. No pericardial effusion. Motion and bolus dispersion decreases sensitivity for detecting pulmonary embolism, but the study is overall diagnostic and negative for pulmonary embolism. No acute aortic findings.  LUNG WINDOWS:  There is patchy ground-glass opacity throughout the lungs, with the basilar predominance. Associated subsegmental atelectasis diffusely. No cavitary change, air bronchogram, or prominent interlobular septal thickening. No pleural effusion. Lung disease has been present since at least 08/13/2014, and progressive.  UPPER ABDOMEN:  No acute findings.  OSSEOUS:  No acute fracture.  No suspicious lytic or blastic lesions.  Review of the MIP images confirms the above findings.  IMPRESSION: 1. Diffuse airspace disease, progressed over the past 10 days. The pattern suggests atypical/opportunistic infection or inflammatory pneumonia (especially hypersensitivity pneumonitis). 2. No evidence of pulmonary embolism.   Electronically Signed   By: Marnee Spring M.D.   On: 08/23/2014 22:25    Assessment/Plan Principal Problem:   Recurrent pneumonia Active Problems:   CAP (community acquired pneumonia)   1. Recurrent pneumonia The patient is presenting with complaints of recurrent symptoms of low-grade fever cough shortness of breath. He is found to be tachycardic. He gets in respiratory distress on exertion. With this his CT scan is positive for groundglass appearance with diffuse airspace disease most likely in the lower lobe progressively worsening consistent with atypical infection or  hypersensitivity pneumonitis. HIV test is done. We will obtain a broad workup to identify any inflammatory etiology or infectious etiology. Patient is currently treated with broad-spectrum antibiotic as he is not responding to antibiotics. Patient will also be given IV hydration. Monitor on telemetry.  Advance goals of care discussion: Full code   DVT Prophylaxis: subcutaneous Heparin. Nutrition: Regular diet  Family Communication: family was present at bedside, opportunity was given to ask question and all questions were answered satisfactorily at the time of interview. Disposition: Admitted to inpatient in telemetry unit.  Author: Lynden Oxford, MD [facility] Pager: [phone] 08/23/2014, 11:51 PM    Addendum: Patient's HIV test is reactive. This was discussed with patient and he mentions that he does not have any high risk behavior does not have any intravenous drug abuse history. He is in the monogamus relationship. HIV antibody is pending. Further workup depending on the results of the confirmation test May require ID consultation.   Tyron Manetta 4:06 AM 08/24/2014    If 7PM-7AM, please contact night-coverage [email or web address] Password [email or web address]

## 2014-08-23 NOTE — ED Provider Notes (Signed)
CSN: [id number]     Arrival date & time 08/23/14  1953 History   First MD Initiated Contact with Patient 08/23/14 2014     Chief Complaint  Patient presents with  . Pneumonia  . Chest Pain     (Consider location/radiation/quality/duration/timing/severity/associated sxs/prior Treatment) The history is provided by the patient and medical records. No language interpreter was used.      Casey Hurley is a 44 y.o. male  with a hx of HTN, genital herpes presents to the Emergency Department complaining of gradual, persistent, progressively worsening dyspnea on exertion, cough, chest pain onset several weeks ago, worsening in the last several days. Pt reports dx with community PNA in Dec 2015.  He reports symptoms were better after multiple abx and ultimately a course of doxycycline.  Beginning of Aug 03, 2014 he began to feel poorly (fever to 102, cough, chest pain, chills) and was diagnosed with PNA again on Aug 13, 2014.  Pt reports he finished a course of Augmentin without relief.  Pt reports he saw his PCP 3 days ago after persistent SOB and fevers.  He was given albuterol and abx was changed to levaquin.  Patient reports he now has shortness of breath and dyspnea on exertion including simply walking from room to room in his house.  Pt is a smoker 1ppd until Dec.  He has been using e-cigarettes but denies marijuana or hash oil.   Pt reports in NOV he became intoxicated and vomited while unconscious.  He reports he has had "lung issues" since that time.   Nothing seems to make his symptoms better. Patient denies abdominal pain, nausea, vomiting, diarrhea, weakness, dizziness, syncope, dysuria, hematuria, headache, neck pain.     Past Medical History  Diagnosis Date  . Genital herpes   . Hypertension    Past Surgical History  Procedure Laterality Date  . Anal examination under anesthesia      anal condylomas   Family History  Problem Relation Age of Onset  . Breast cancer    . Prostate  cancer Father    History  Substance Use Topics  . Smoking status: Current Every Day Smoker -- 1.00 packs/day    Types: Cigarettes  . Smokeless tobacco: Never Used  . Alcohol Use: 1.2 oz/week    2 Cans of beer per week     Comment: daily    Review of Systems  Constitutional: Positive for fever and diaphoresis (night sweats). Negative for appetite change, fatigue and unexpected weight change.  HENT: Negative for mouth sores.   Eyes: Negative for visual disturbance.  Respiratory: Positive for cough and shortness of breath. Negative for chest tightness and wheezing.   Cardiovascular: Positive for chest pain.  Gastrointestinal: Negative for nausea, vomiting, abdominal pain, diarrhea and constipation.  Endocrine: Negative for polydipsia, polyphagia and polyuria.  Genitourinary: Negative for dysuria, urgency, frequency and hematuria.  Musculoskeletal: Negative for back pain and neck stiffness.  Skin: Negative for rash.  Allergic/Immunologic: Negative for immunocompromised state.  Neurological: Negative for syncope, light-headedness and headaches.  Hematological: Does not bruise/bleed easily.  Psychiatric/Behavioral: Negative for sleep disturbance. The patient is not nervous/anxious.       Allergies  Review of patient's allergies indicates no known allergies.  Home Medications   Prior to Admission medications   Medication Sig Start Date End Date Taking? Authorizing Provider  ALPRAZolam Prudy Feeler) 0.5 MG tablet Take 1 tablet (0.5 mg total) by mouth at bedtime as needed. 12/11/13  Yes Harriette Bouillon, MD  levofloxacin (  LEVAQUIN) 750 MG tablet Take 1 tablet by mouth daily. For 10 days 08/20/14  Yes Historical Provider, MD  valACYclovir (VALTREX) 500 MG tablet Take 500 mg by mouth daily as needed (flare ups).    Yes Historical Provider, MD  lidocaine (XYLOCAINE JELLY) 2 % jelly Apply to affected area daily prn Patient not taking: Reported on 08/23/2014 11/28/13 11/28/14  Harriette Bouillonhomas Cornett, MD   BP  126/84 mmHg  Pulse 114  Temp(Src) 99.9 F (37.7 C) (Rectal)  Resp 22  Ht 5\' 9"  (1.753 m)  Wt 145 lb (65.772 kg)  BMI 21.40 kg/m2  SpO2 98% Physical Exam  Constitutional: He is oriented to person, place, and time. He appears well-developed and well-nourished. No distress.  Awake, alert, nontoxic appearance  HENT:  Head: Normocephalic and atraumatic.  Right Ear: Tympanic membrane, external ear and ear canal normal.  Left Ear: Tympanic membrane, external ear and ear canal normal.  Nose: Mucosal edema and rhinorrhea present. No epistaxis. Right sinus exhibits no maxillary sinus tenderness and no frontal sinus tenderness. Left sinus exhibits no maxillary sinus tenderness and no frontal sinus tenderness.  Mouth/Throat: Uvula is midline, oropharynx is clear and moist and mucous membranes are normal. Mucous membranes are not pale and not cyanotic. No oropharyngeal exudate, posterior oropharyngeal edema, posterior oropharyngeal erythema or tonsillar abscesses.  Eyes: Conjunctivae are normal. Pupils are equal, round, and reactive to light. No scleral icterus.  Neck: Normal range of motion and full passive range of motion without pain. Neck supple.  Cardiovascular: Regular rhythm, normal heart sounds and intact distal pulses.  Tachycardia present.   Pulses:      Radial pulses are 2+ on the right side, and 2+ on the left side.  Pulmonary/Chest: Effort normal. No accessory muscle usage or stridor. No respiratory distress. He has no wheezes. He has rales in the right lower field.  Faint crackles heard in the right lung base, otherwise lungs are clear No accessory muscle usage or acute distress  Abdominal: Soft. Bowel sounds are normal. He exhibits no mass. There is no tenderness. There is no rebound and no guarding.  Musculoskeletal: Normal range of motion. He exhibits no edema.  Lymphadenopathy:    He has no cervical adenopathy.  Neurological: He is alert and oriented to person, place, and time.   Speech is clear and goal oriented Moves extremities without ataxia  Skin: Skin is warm and dry. No rash noted. He is not diaphoretic.  Psychiatric: He has a normal mood and affect.  Nursing note and vitals reviewed.   ED Course  Procedures (including critical care time) Labs Review Labs Reviewed  BASIC METABOLIC PANEL - Abnormal; Notable for the following:    Potassium 3.3 (*)    Glucose, Bld 112 (*)    GFR calc non Af Amer 84 (*)    All other components within normal limits  CULTURE, BLOOD (ROUTINE X 2)  CULTURE, BLOOD (ROUTINE X 2)  RESPIRATORY VIRUS PANEL  CULTURE, EXPECTORATED SPUTUM-ASSESSMENT  CBC  BRAIN NATRIURETIC PEPTIDE  RAPID HIV SCREEN ([facility]-MAU)  SEDIMENTATION RATE  C-REACTIVE PROTEIN  LACTATE DEHYDROGENASE  ANA, BODY FLUID  URINALYSIS W MICROSCOPIC  STREP PNEUMONIAE URINARY ANTIGEN  LEGIONELLA ANTIGEN, URINE  INFLUENZA PANEL BY PCR (TYPE A & B, H1N1)  URINE RAPID DRUG SCREEN (HOSP PERFORMED)  I-STAT TROPOININ, ED  I-STAT CG4 LACTIC ACID, ED    Imaging Review Dg Chest 2 View  08/23/2014   CLINICAL DATA:  Bilateral lower chest pain.  Bilateral pneumonia.  EXAM: CHEST  2 VIEW  COMPARISON:  08/13/2014  FINDINGS: Hyperinflation of the lungs. Increased opacities in both lower lung fields. This is concerning for pneumonia. This has increased slightly since prior study. No effusions. Heart is normal size. No acute bony abnormality.  IMPRESSION: Increasing bilateral lower lobe airspace opacities concerning for pneumonia.   Electronically Signed   By: Charlett Nose M.D.   On: 08/23/2014 20:35   Ct Angio Chest Pe W/cm &/or Wo Cm  08/23/2014   CLINICAL DATA:  Increasing heart rate. Currently under therapy for pneumonia.  EXAM: CT ANGIOGRAPHY CHEST WITH CONTRAST  TECHNIQUE: Multidetector CT imaging of the chest was performed using the standard protocol during bolus administration of intravenous contrast. Multiplanar CT image reconstructions and MIPs were obtained to evaluate  the vascular anatomy.  CONTRAST:  OMNIPAQUE IOHEXOL 350 MG/ML SOLN  COMPARISON:  None.  FINDINGS: THORACIC INLET/BODY WALL:  No acute abnormality.  MEDIASTINUM:  Normal heart size. No pericardial effusion. Motion and bolus dispersion decreases sensitivity for detecting pulmonary embolism, but the study is overall diagnostic and negative for pulmonary embolism. No acute aortic findings.  LUNG WINDOWS:  There is patchy ground-glass opacity throughout the lungs, with the basilar predominance. Associated subsegmental atelectasis diffusely. No cavitary change, air bronchogram, or prominent interlobular septal thickening. No pleural effusion. Lung disease has been present since at least 08/13/2014, and progressive.  UPPER ABDOMEN:  No acute findings.  OSSEOUS:  No acute fracture.  No suspicious lytic or blastic lesions.  Review of the MIP images confirms the above findings.  IMPRESSION: 1. Diffuse airspace disease, progressed over the past 10 days. The pattern suggests atypical/opportunistic infection or inflammatory pneumonia (especially hypersensitivity pneumonitis). 2. No evidence of pulmonary embolism.   Electronically Signed   By: Marnee Spring M.D.   On: 08/23/2014 22:25     EKG Interpretation   Date/Time:  Sunday August 23 2014 19:57:55 EST Ventricular Rate:  126 PR Interval:  175 QRS Duration: 89 QT Interval:  271 QTC Calculation: 392 R Axis:   91 Text Interpretation:  Sinus tachycardia Consider right atrial enlargement  Borderline right axis deviation Consider left ventricular hypertrophy  Nonspecific T abnormalities, inferior leads Baseline wander in lead(s) V2  V5 Confirmed by COOK  MD, BRIAN ([id number]) on 08/23/2014 8:15:50 PM      MDM   Final diagnoses:  SOB (shortness of breath)  DOE (dyspnea on exertion)  Tachycardia  Recurrent pneumonia   Casey Hurley presents with dyspnea on exertion, chest pain and persistent pneumonia with fevers at home. Patient also with night  sweats. He denies weight loss. Patient has had multiple chest x-rays with evidence of pneumonia.  He reports that in spite of numerous antibiotics he continues to not see improvement.  Chest x-ray today with increasing bilateral lower lobe airspace opacities concerning for pneumonia. Patient has not had a CT scan. He is tachycardic to the 120s here in the emergency department. He reports minimal activity as he has had too much trouble breathing. He denies leg swelling.  Will obtain CT NG her chest to rule out PE and better characterize his bilateral pneumonia.  11:08 PM CT anterior chest with diffuse airspace disease progressive over the last 10 days with atypical/opportunistic infection or inflammatory pneumonia pattern. Discussed with pharmacy as pt has failed Augmentin, doxycycline and levaquin.  Recommends beginning to treat as HCAP at this point.  Patient will need admission.  BP 126/84 mmHg  Pulse 114  Temp(Src) 99.9 F (37.7 C) (Rectal)  Resp 22  Ht  (1.753 m)  Wt 145 lb (65.772 kg)  BMI 21.40 kg/m2  SpO2 98%   11:22 PM Discussed with Dr. Allena Katz who will admit to tele.  HIV and FLU panel pending.  Dahlia Client Erion Hermans, PA-C 08/23/14 1610  Linwood Dibbles, MD 08/23/14 [phone]

## 2014-08-23 NOTE — ED Notes (Signed)
Pt is being treated for bilateral PNA with augmentin and levaquin. Patient's hr continues to increase. 120 at present. Alert and oriented. C/O chest pain now.

## 2014-08-24 ENCOUNTER — Encounter (HOSPITAL_COMMUNITY): Payer: Self-pay | Admitting: General Practice

## 2014-08-24 DIAGNOSIS — J189 Pneumonia, unspecified organism: Secondary | ICD-10-CM | POA: Diagnosis present

## 2014-08-24 DIAGNOSIS — Z21 Asymptomatic human immunodeficiency virus [HIV] infection status: Secondary | ICD-10-CM | POA: Diagnosis not present

## 2014-08-24 DIAGNOSIS — I10 Essential (primary) hypertension: Secondary | ICD-10-CM | POA: Diagnosis present

## 2014-08-24 DIAGNOSIS — Z8701 Personal history of pneumonia (recurrent): Secondary | ICD-10-CM | POA: Diagnosis not present

## 2014-08-24 DIAGNOSIS — B20 Human immunodeficiency virus [HIV] disease: Secondary | ICD-10-CM | POA: Diagnosis present

## 2014-08-24 DIAGNOSIS — A630 Anogenital (venereal) warts: Secondary | ICD-10-CM | POA: Diagnosis not present

## 2014-08-24 DIAGNOSIS — A6000 Herpesviral infection of urogenital system, unspecified: Secondary | ICD-10-CM | POA: Diagnosis present

## 2014-08-24 DIAGNOSIS — Z8619 Personal history of other infectious and parasitic diseases: Secondary | ICD-10-CM | POA: Diagnosis not present

## 2014-08-24 DIAGNOSIS — G4700 Insomnia, unspecified: Secondary | ICD-10-CM | POA: Diagnosis present

## 2014-08-24 DIAGNOSIS — J449 Chronic obstructive pulmonary disease, unspecified: Secondary | ICD-10-CM | POA: Diagnosis present

## 2014-08-24 DIAGNOSIS — R000 Tachycardia, unspecified: Secondary | ICD-10-CM | POA: Diagnosis not present

## 2014-08-24 DIAGNOSIS — F17210 Nicotine dependence, cigarettes, uncomplicated: Secondary | ICD-10-CM | POA: Diagnosis present

## 2014-08-24 DIAGNOSIS — B59 Pneumocystosis: Secondary | ICD-10-CM | POA: Diagnosis not present

## 2014-08-24 DIAGNOSIS — J9601 Acute respiratory failure with hypoxia: Secondary | ICD-10-CM | POA: Diagnosis present

## 2014-08-24 DIAGNOSIS — R0602 Shortness of breath: Secondary | ICD-10-CM | POA: Diagnosis present

## 2014-08-24 DIAGNOSIS — N529 Male erectile dysfunction, unspecified: Secondary | ICD-10-CM | POA: Diagnosis present

## 2014-08-24 LAB — CBC WITH DIFFERENTIAL/PLATELET
BASOS PCT: 0 % (ref 0–1)
Basophils Absolute: 0 10*3/uL (ref 0.0–0.1)
EOS ABS: 0.4 10*3/uL (ref 0.0–0.7)
EOS PCT: 5 % (ref 0–5)
HEMATOCRIT: 40.2 % (ref 39.0–52.0)
Hemoglobin: 14 g/dL (ref 13.0–17.0)
Lymphocytes Relative: 24 % (ref 12–46)
Lymphs Abs: 1.7 10*3/uL (ref 0.7–4.0)
MCH: 31.7 pg (ref 26.0–34.0)
MCHC: 34.8 g/dL (ref 30.0–36.0)
MCV: 91 fL (ref 78.0–100.0)
MONOS PCT: 5 % (ref 3–12)
Monocytes Absolute: 0.4 10*3/uL (ref 0.1–1.0)
NEUTROS PCT: 66 % (ref 43–77)
Neutro Abs: 4.7 10*3/uL (ref 1.7–7.7)
Platelets: 234 10*3/uL (ref 150–400)
RBC: 4.42 MIL/uL (ref 4.22–5.81)
RDW: 12 % (ref 11.5–15.5)
WBC: 7.1 10*3/uL (ref 4.0–10.5)

## 2014-08-24 LAB — HEPATIC FUNCTION PANEL
ALT: 26 U/L (ref 0–53)
AST: 31 U/L (ref 0–37)
Albumin: 3.2 g/dL — ABNORMAL LOW (ref 3.5–5.2)
Alkaline Phosphatase: 100 U/L (ref 39–117)
Total Bilirubin: 0.5 mg/dL (ref 0.3–1.2)
Total Protein: 7.6 g/dL (ref 6.0–8.3)

## 2014-08-24 LAB — RAPID URINE DRUG SCREEN, HOSP PERFORMED
Amphetamines: NOT DETECTED
BARBITURATES: NOT DETECTED
BENZODIAZEPINES: NOT DETECTED
Cocaine: NOT DETECTED
Opiates: NOT DETECTED
TETRAHYDROCANNABINOL: NOT DETECTED

## 2014-08-24 LAB — URINALYSIS W MICROSCOPIC (NOT AT ARMC)
Bilirubin Urine: NEGATIVE
GLUCOSE, UA: NEGATIVE mg/dL
HGB URINE DIPSTICK: NEGATIVE
Ketones, ur: NEGATIVE mg/dL
LEUKOCYTES UA: NEGATIVE
Nitrite: NEGATIVE
Protein, ur: NEGATIVE mg/dL
Specific Gravity, Urine: 1.036 — ABNORMAL HIGH (ref 1.005–1.030)
Urobilinogen, UA: 0.2 mg/dL (ref 0.0–1.0)
pH: 6 (ref 5.0–8.0)

## 2014-08-24 LAB — EXPECTORATED SPUTUM ASSESSMENT W GRAM STAIN, RFLX TO RESP C

## 2014-08-24 LAB — BLOOD GAS, ARTERIAL
Acid-base deficit: 0.4 mmol/L (ref 0.0–2.0)
Bicarbonate: 22.6 mEq/L (ref 20.0–24.0)
Drawn by: 225631
FIO2: 0.21 %
O2 Saturation: 96.9 %
PCO2 ART: 33.5 mmHg — AB (ref 35.0–45.0)
PH ART: 7.444 (ref 7.350–7.450)
PO2 ART: 86.6 mmHg (ref 80.0–100.0)
Patient temperature: 98.6
TCO2: 19.7 mmol/L (ref 0–100)

## 2014-08-24 LAB — STREP PNEUMONIAE URINARY ANTIGEN: Strep Pneumo Urinary Antigen: NEGATIVE

## 2014-08-24 LAB — LACTATE DEHYDROGENASE: LDH: 148 U/L (ref 94–250)

## 2014-08-24 LAB — RAPID HIV SCREEN (WH-MAU): SUDS RAPID HIV SCREEN: REACTIVE — AB

## 2014-08-24 LAB — C-REACTIVE PROTEIN: CRP: 1.2 mg/dL — AB (ref <0.60)

## 2014-08-24 LAB — SEDIMENTATION RATE: SED RATE: 67 mm/h — AB (ref 0–16)

## 2014-08-24 LAB — EXPECTORATED SPUTUM ASSESSMENT W REFEX TO RESP CULTURE

## 2014-08-24 MED ORDER — ENOXAPARIN SODIUM 40 MG/0.4ML ~~LOC~~ SOLN
40.0000 mg | Freq: Every day | SUBCUTANEOUS | Status: DC
  Administered 2014-08-24 – 2014-08-27 (×5): 40 mg via SUBCUTANEOUS
  Filled 2014-08-24 (×6): qty 0.4

## 2014-08-24 MED ORDER — VANCOMYCIN HCL IN DEXTROSE 750-5 MG/150ML-% IV SOLN
750.0000 mg | Freq: Three times a day (TID) | INTRAVENOUS | Status: DC
  Filled 2014-08-24 (×2): qty 150

## 2014-08-24 MED ORDER — PREDNISONE 20 MG PO TABS
40.0000 mg | ORAL_TABLET | Freq: Two times a day (BID) | ORAL | Status: DC
  Administered 2014-08-24 – 2014-08-28 (×9): 40 mg via ORAL
  Filled 2014-08-24 (×11): qty 2

## 2014-08-24 MED ORDER — INFLUENZA VAC SPLIT QUAD 0.5 ML IM SUSY
0.5000 mL | PREFILLED_SYRINGE | INTRAMUSCULAR | Status: AC
  Administered 2014-08-25: 0.5 mL via INTRAMUSCULAR
  Filled 2014-08-24 (×2): qty 0.5

## 2014-08-24 MED ORDER — TUBERCULIN PPD 5 UNIT/0.1ML ID SOLN
5.0000 [IU] | Freq: Once | INTRADERMAL | Status: AC
Start: 2014-08-24 — End: 2014-08-26
  Administered 2014-08-24: 5 [IU] via INTRADERMAL
  Filled 2014-08-24: qty 0.1

## 2014-08-24 MED ORDER — SODIUM CHLORIDE 0.9 % IV SOLN
INTRAVENOUS | Status: AC
Start: 2014-08-24 — End: 2014-08-26
  Administered 2014-08-24 (×2): via INTRAVENOUS

## 2014-08-24 MED ORDER — ALPRAZOLAM 0.5 MG PO TABS: 0.5000 mg | ORAL_TABLET | Freq: Every evening | ORAL | Status: DC | PRN

## 2014-08-24 MED ORDER — LEVOFLOXACIN IN D5W 750 MG/150ML IV SOLN
750.0000 mg | INTRAVENOUS | Status: AC
  Administered 2014-08-24 – 2014-08-25 (×2): 750 mg via INTRAVENOUS
  Filled 2014-08-24 (×2): qty 150

## 2014-08-24 MED ORDER — LORAZEPAM 0.5 MG PO TABS
0.5000 mg | ORAL_TABLET | Freq: Two times a day (BID) | ORAL | Status: DC | PRN
  Administered 2014-08-24 – 2014-08-27 (×4): 0.5 mg via ORAL
  Filled 2014-08-24 (×4): qty 1

## 2014-08-24 MED ORDER — SULFAMETHOXAZOLE-TRIMETHOPRIM 400-80 MG/5ML IV SOLN
320.0000 mg | Freq: Three times a day (TID) | INTRAVENOUS | Status: DC
  Administered 2014-08-24 – 2014-08-26 (×7): 320 mg via INTRAVENOUS
  Filled 2014-08-24 (×8): qty 20

## 2014-08-24 MED ORDER — CEFEPIME HCL 1 G IJ SOLR
1.0000 g | Freq: Three times a day (TID) | INTRAMUSCULAR | Status: DC
  Administered 2014-08-24: 1 g via INTRAVENOUS
  Filled 2014-08-24 (×2): qty 1

## 2014-08-24 MED ORDER — VALACYCLOVIR HCL 500 MG PO TABS
500.0000 mg | ORAL_TABLET | Freq: Every day | ORAL | Status: DC
  Administered 2014-08-24 – 2014-08-28 (×5): 500 mg via ORAL
  Filled 2014-08-24 (×5): qty 1

## 2014-08-24 MED ORDER — PNEUMOCOCCAL VAC POLYVALENT 25 MCG/0.5ML IJ INJ
0.5000 mL | INJECTION | INTRAMUSCULAR | Status: AC
  Administered 2014-08-25: 0.5 mL via INTRAMUSCULAR
  Filled 2014-08-24 (×2): qty 0.5

## 2014-08-24 MED ORDER — ALPRAZOLAM 0.5 MG PO TABS
0.5000 mg | ORAL_TABLET | Freq: Three times a day (TID) | ORAL | Status: DC | PRN
  Administered 2014-08-24: 0.5 mg via ORAL
  Filled 2014-08-24 (×2): qty 1

## 2014-08-24 MED ORDER — ALBUTEROL SULFATE (2.5 MG/3ML) 0.083% IN NEBU: 2.5000 mg | INHALATION_SOLUTION | RESPIRATORY_TRACT | Status: AC | PRN

## 2014-08-24 NOTE — Progress Notes (Addendum)
[facility] PROGRESS NOTE  SHIVA SAHAGIAN WJX:[id number] DOB: 09/07/1970 DOA: 08/23/2014 PCP: Astrid Divine, MD  Summary I have seen and examined Mr. Nee at bedside in the presence of his wife and reviewed his chart.  CAIDEN ARTEAGA is a 44 y.o. male with medical history of essential hypertension, genital herpes, who came in with with complaints of cough and shortness of breath since December 2015, which has not improved despite several rounds of antibiotics outpatient and his HIV antibody was positive at the time of admission, white count 4300, and the concern is for PCP/other typical organisms as he also had CT of the chest which showed "1. Diffuse airspace disease, progressed over the past 10 days. The pattern suggests atypical/opportunistic infection or inflammatory pneumonia (especially hypersensitivity pneumonitis). 2. No evidence of pulmonary embolism". His LDH was normal, but ABG/PCP smear/sputum AFBs pending. Patient was started on vancomycin/cefepime and Bactrim was added today(will need to monitor renal function). I have also added prednisone assuming he may have PCP. HIV viral load/CD4 count pending. Will check PCP smear/sputum AFBs/PPD. Will also consult ID/pulmonary for further workup and management. I have gone over the treatment plan with patient and his wife and they are waiting for confirmation of HIV status. Understandably, they're both still apprehensive about HIV status and eager to get confirmatory results. His wife states that she had HIV test in 2013 and it was okay then but patient has not had one in recent years. All the same, I offered hope that if confirmed, this is a condition that can be managed. Plan Recurrent pneumonia/CAP (community acquired pneumonia)/HIV antibody positive/genital herpes history  HIV viral load/CD4 count  ABG/PCP smear/PPD/sputum AFB  Add Bactrim. Monitor renal function  Consult pulmonary/ID. Essential hypertension  Blood  pressure seems okay without medications.   Monitor off meds for now, start antihypertensives as necessary.. Code Status: Full Family Communication: Wife at bedside Disposition Plan: Eventually home   Consultants:  ID  Pulmonary  Procedures:  None  Antibiotics:  Vancomycin 08/23/2014>  Cefepime 08/23/2014>  Bactrim 08/24/2014>  HPI/Subjective: *"OK". Still coughing.  Objective: Filed Vitals:   08/24/14 0530  BP: 123/76  Pulse: 111  Temp: 98.6 F (37 C)  Resp: 18    Intake/Output Summary (Last 24 hours) at 08/24/14 1045 Last data filed at 08/24/14 0700  Gross per 24 hour  Intake  442.5 ml  Output      0 ml  Net  442.5 ml   Filed Weights   08/23/14 2000 08/24/14 0044 08/24/14 0500  Weight: 65.772 kg (145 lb) 61.735 kg (136 lb 1.6 oz) 61.689 kg (136 lb)    Exam:   General:  Comfortable at rest.  Cardiovascular: S1-S2 normal. No murmurs. Pulse regular.  Respiratory: Good air entry bilaterally. No rhonchi or rales.  Abdomen: Soft and nontender. Normal bowel sounds. No organomegaly.  Musculoskeletal: No pedal edema   Neurological: Intact  Data Reviewed: Basic Metabolic Panel:  Recent Labs Lab 08/23/14 2014  NA 137  K 3.3*  CL 102  CO2 23  GLUCOSE 112*  BUN 10  CREATININE 1.08  CALCIUM 9.1   Liver Function Tests:  Recent Labs Lab 08/23/14 2352  AST 31  ALT 26  ALKPHOS 100  BILITOT 0.5  PROT 7.6  ALBUMIN 3.2*   No results for input(s): LIPASE, AMYLASE in the last 168 hours. No results for input(s): AMMONIA in the last 168 hours. CBC:  Recent Labs Lab 08/23/14 2014 08/23/14 2352  WBC 6.1 7.1  NEUTROABS  --  4.7  HGB 15.2 14.0  HCT 42.5 40.2  MCV 89.7 91.0  PLT 243 234   Cardiac Enzymes: No results for input(s): CKTOTAL, CKMB, CKMBINDEX, TROPONINI in the last 168 hours. BNP (last 3 results)  Recent Labs  08/23/14 2014  BNP 9.8    ProBNP (last 3 results) No results for input(s): PROBNP in the last 8760  hours.  CBG: No results for input(s): GLUCAP in the last 168 hours.  Recent Results (from the past 240 hour(s))  Culture, expectorated sputum-assessment     Status: None   Collection Time: 08/24/14  8:44 AM  Result Value Ref Range Status   Specimen Description SPUTUM  Final   Special Requests NONE  Final   Sputum evaluation   Final    THIS SPECIMEN IS ACCEPTABLE. RESPIRATORY CULTURE REPORT TO FOLLOW.   Report Status 08/24/2014 FINAL  Final     Studies: Dg Chest 2 View  08/23/2014   CLINICAL DATA:  Bilateral lower chest pain.  Bilateral pneumonia.  EXAM: CHEST  2 VIEW  COMPARISON:  08/13/2014  FINDINGS: Hyperinflation of the lungs. Increased opacities in both lower lung fields. This is concerning for pneumonia. This has increased slightly since prior study. No effusions. Heart is normal size. No acute bony abnormality.  IMPRESSION: Increasing bilateral lower lobe airspace opacities concerning for pneumonia.   Electronically Signed   By: Charlett Nose M.D.   On: 08/23/2014 20:35   Ct Angio Chest Pe W/cm &/or Wo Cm  08/23/2014   CLINICAL DATA:  Increasing heart rate. Currently under therapy for pneumonia.  EXAM: CT ANGIOGRAPHY CHEST WITH CONTRAST  TECHNIQUE: Multidetector CT imaging of the chest was performed using the standard protocol during bolus administration of intravenous contrast. Multiplanar CT image reconstructions and MIPs were obtained to evaluate the vascular anatomy.  CONTRAST:  OMNIPAQUE IOHEXOL 350 MG/ML SOLN  COMPARISON:  None.  FINDINGS: THORACIC INLET/BODY WALL:  No acute abnormality.  MEDIASTINUM:  Normal heart size. No pericardial effusion. Motion and bolus dispersion decreases sensitivity for detecting pulmonary embolism, but the study is overall diagnostic and negative for pulmonary embolism. No acute aortic findings.  LUNG WINDOWS:  There is patchy ground-glass opacity throughout the lungs, with the basilar predominance. Associated subsegmental atelectasis diffusely. No  cavitary change, air bronchogram, or prominent interlobular septal thickening. No pleural effusion. Lung disease has been present since at least 08/13/2014, and progressive.  UPPER ABDOMEN:  No acute findings.  OSSEOUS:  No acute fracture.  No suspicious lytic or blastic lesions.  Review of the MIP images confirms the above findings.  IMPRESSION: 1. Diffuse airspace disease, progressed over the past 10 days. The pattern suggests atypical/opportunistic infection or inflammatory pneumonia (especially hypersensitivity pneumonitis). 2. No evidence of pulmonary embolism.   Electronically Signed   By: Marnee Spring M.D.   On: 08/23/2014 22:25    Scheduled Meds: . ceFEPime (MAXIPIME) IV  1 g Intravenous Q8H  . enoxaparin (LOVENOX) injection  40 mg Subcutaneous QHS  . [START ON 08/25/2014] Influenza vac split quadrivalent PF  0.5 mL Intramuscular Tomorrow-1000  . [START ON 08/25/2014] pneumococcal 23 valent vaccine  0.5 mL Intramuscular Tomorrow-1000  . predniSONE  40 mg Oral BID WC  . sulfamethoxazole-trimethoprim  320 mg Intravenous Q8H  . tuberculin  5 Units Intradermal Once  . valACYclovir  500 mg Oral Daily  . vancomycin  750 mg Intravenous Q8H   Continuous Infusions: . sodium chloride 75 mL/hr at 08/24/14 0106     Time spent: 25 minutes  Genowefa Morga  [facility] Pager [phone]. If 7PM-7AM, please contact night-coverage at [email or web address], password [email or web address] 08/24/2014, 10:45 AM  LOS: 0 days

## 2014-08-24 NOTE — Consult Note (Signed)
[place] [facility]  Total days of antibiotics 2        Day 2 cefepime        Day 1 bactrim        Day 1 vanco       Reason for Consult: hiv ab positive and pneumonia    Referring Physician: ranga  Principal Problem:   Recurrent pneumonia Active Problems:   CAP (community acquired pneumonia)   HIV antibody positive   Essential hypertension   Genital herpes    HPI: Casey Hurley is a 44 y.o. male who has past hx of HTN who is admitted for progressive DOE, and nightsweats, low grade fevers x 10 days. He states that this process started in December 2015. He previously was treated with 2 course of antibiotics in December which would improve his cough. He was most recently started on levofloxacin Day #3 on day of admit. He states that it has worsened to point where he is unable to ambulate in his house without becoming short of breath. He has missed numerous days of work, working as a Dealer.  His prior antibiotic courses include doxycycline, amox/clav, azithromycin. He is currently on levofloxacin prior to admit. He had prior cxr roughly 10 days ago, and in comparison to cxr on day of admit, it reveals progression of diffuse airspace disease involving both lung fields. On admit, tlc 1.7. Temp only 99.9. Started onHCAP treatment with vancomycin and cefepime. hiv ab test was positive. pateint seen by pulmonary care team for bronchoscopy in the am.  In December when he had symptoms originally, he did install air conditioning in older home where he was "under the house"; no recent travel to the [place]. No pets, no birds.  He reports that he has remote hx of prior hiv testing in the 1990s. No recent HIV testing. He was briefly separated from his wife in 2012, where he had a relationship with a new partner who he later found out was a drug user, but he himself has no history of IV drug use, no known hx of being sexual involved with HIV + partner. He is now back together  with his wife, who is pursuing hiv testing through her pcp. They have no children together.  No other illnesses in 2015, other than the winter. He has annual physical exam with pcp. Denies weight loss. Usually 135-145 in weight. 5 ft 10  Past Medical History  Diagnosis Date  . Genital herpes   . Hypertension     Allergies: No Known Allergies   MEDICATIONS: . enoxaparin (LOVENOX) injection  40 mg Subcutaneous QHS  . [START ON 08/25/2014] Influenza vac split quadrivalent PF  0.5 mL Intramuscular Tomorrow-1000  . levofloxacin (LEVAQUIN) IV  750 mg Intravenous Q24H  . [START ON 08/25/2014] pneumococcal 23 valent vaccine  0.5 mL Intramuscular Tomorrow-1000  . predniSONE  40 mg Oral BID WC  . sulfamethoxazole-trimethoprim  320 mg Intravenous Q8H  . tuberculin  5 Units Intradermal Once  . valACYclovir  500 mg Oral Daily    History  Substance Use Topics  . Smoking status: Current Every Day Smoker -- 1.00 packs/day    Types: Cigarettes  . Smokeless tobacco: Never Used  . Alcohol Use: 1.2 oz/week    2 Cans of beer per week     Comment: daily    Family History  Problem Relation Age of Onset  . Breast cancer    . Prostate cancer Father  Review of Systems  Constitutional: positive for fever, chills, diaphoresis, activity change, but negative appetite change, fatigue and unexpected weight change.  HENT: Negative for congestion, sore throat, rhinorrhea, sneezing, trouble swallowing and sinus pressure.  Eyes: Negative for photophobia and visual disturbance.  Respiratory:Positive for SOB, DOE. Negative for cough, chest tightness, wheezing and stridor.  Cardiovascular: Negative for chest pain, palpitations and leg swelling.  Gastrointestinal: Negative for nausea, vomiting, abdominal pain, diarrhea, constipation, blood in stool, abdominal distention and anal bleeding.  Genitourinary: Negative for dysuria, hematuria, flank pain and difficulty urinating.  Musculoskeletal: Negative for  myalgias, back pain, joint swelling, arthralgias and gait problem.  Skin: Negative for color change, pallor, rash and wound.  Neurological: Negative for dizziness, tremors, weakness and light-headedness.  Hematological: Negative for adenopathy. Does not bruise/bleed easily.  Psychiatric/Behavioral: Negative for behavioral problems, confusion, sleep disturbance, dysphoric mood, decreased concentration and agitation.     OBJECTIVE: Temp:  [97.7 F (36.5 C)-99.9 F (37.7 C)] 98.6 F (37 C) (02/29 0530) Pulse Rate:  [106-129] 111 (02/29 0530) Resp:  [16-26] 18 (02/29 0530) BP: (111-133)/(76-92) 123/76 mmHg (02/29 0530) SpO2:  [96 %-100 %] 96 % (02/29 0530) Weight:  [136 lb (61.689 kg)-145 lb (65.772 kg)] 136 lb (61.689 kg) (02/29 0500) Physical Exam  Constitutional: He is oriented to person, place, and time. He appears well-developed and well-nourished. No distress.  HENT:  Mouth/Throat: Oropharynx is clear and moist. No oropharyngeal exudate.  Cardiovascular: Normal rate, regular rhythm and normal heart sounds. Exam reveals no gallop and no friction rub.  No murmur heard.  Pulmonary/Chest: Effort normal and breath sounds normal. No respiratory distress. He has no wheezes.  Abdominal: Soft. Bowel sounds are normal. He exhibits no distension. There is no tenderness.  Lymphadenopathy:  He has no cervical adenopathy.  Neurological: He is alert and oriented to person, place, and time.  Skin: Skin is warm and dry. No rash noted. No erythema. Tattoos to upper arms and left leg  Psychiatric: He has a normal mood and affect. His behavior is normal.    LABS: Results for orders placed or performed during the hospital encounter of 08/23/14 (from the past 48 hour(s))  CBC     Status: None   Collection Time: 08/23/14  8:14 PM  Result Value Ref Range   WBC 6.1 4.0 - 10.5 K/uL   RBC 4.74 4.22 - 5.81 MIL/uL   Hemoglobin 15.2 13.0 - 17.0 g/dL   HCT 42.5 39.0 - 52.0 %   MCV 89.7 78.0 - 100.0 fL     MCH 32.1 26.0 - 34.0 pg   MCHC 35.8 30.0 - 36.0 g/dL   RDW 12.0 11.5 - 15.5 %   Platelets 243 150 - 400 K/uL  Basic metabolic panel     Status: Abnormal   Collection Time: 08/23/14  8:14 PM  Result Value Ref Range   Sodium 137 135 - 145 mmol/L   Potassium 3.3 (L) 3.5 - 5.1 mmol/L   Chloride 102 96 - 112 mmol/L   CO2 23 19 - 32 mmol/L   Glucose, Bld 112 (H) 70 - 99 mg/dL   BUN 10 6 - 23 mg/dL   Creatinine, Ser 1.08 0.50 - 1.35 mg/dL   Calcium 9.1 8.4 - 10.5 mg/dL   GFR calc non Af Amer 84 (L) >90 mL/min   GFR calc Af Amer >90 >90 mL/min    Comment: (NOTE) The eGFR has been calculated using the CKD EPI equation. This calculation has not been validated in all  clinical situations. eGFR's persistently <90 mL/min signify possible Chronic Kidney Disease.    Anion gap 12 5 - 15  BNP (order ONLY if patient complains of dyspnea/SOB AND you have documented it for THIS visit)     Status: None   Collection Time: 08/23/14  8:14 PM  Result Value Ref Range   B Natriuretic Peptide 9.8 0.0 - 100.0 pg/mL  I-stat troponin, ED (not at [facility])     Status: None   Collection Time: 08/23/14  8:27 PM  Result Value Ref Range   Troponin i, poc 0.00 0.00 - 0.08 ng/mL   Comment 3            Comment: Due to the release kinetics of cTnI, a negative result within the first hours of the onset of symptoms does not rule out myocardial infarction with certainty. If myocardial infarction is still suspected, repeat the test at appropriate intervals.   I-Stat CG4 Lactic Acid, ED     Status: None   Collection Time: 08/23/14  9:42 PM  Result Value Ref Range   Lactic Acid, Venous 0.79 0.5 - 2.0 mmol/L  Rapid HIV screen [facility])     Status: Abnormal   Collection Time: 08/23/14 11:52 PM  Result Value Ref Range   Rapid HIV Screen Reactive (A) NON REACTIVE    Comment:        This test is not diagnostic of HIV infection and must be sent for further testing. REPEATED TO VERIFY RESULT CALLED TO, READ BACK BY AND  VERIFIED WITH: A.DENNIS,RN AT 0111 ON 08/24/14 BY W.SHEA,MT   C-reactive protein     Status: Abnormal   Collection Time: 08/23/14 11:52 PM  Result Value Ref Range   CRP 1.2 (H) <0.60 mg/dL    Comment: Performed at [facility]  CBC with Differential/Platelet     Status: None   Collection Time: 08/23/14 11:52 PM  Result Value Ref Range   WBC 7.1 4.0 - 10.5 K/uL   RBC 4.42 4.22 - 5.81 MIL/uL   Hemoglobin 14.0 13.0 - 17.0 g/dL   HCT 40.2 39.0 - 52.0 %   MCV 91.0 78.0 - 100.0 fL   MCH 31.7 26.0 - 34.0 pg   MCHC 34.8 30.0 - 36.0 g/dL   RDW 12.0 11.5 - 15.5 %   Platelets 234 150 - 400 K/uL   Neutrophils Relative % 66 43 - 77 %   Neutro Abs 4.7 1.7 - 7.7 K/uL   Lymphocytes Relative 24 12 - 46 %   Lymphs Abs 1.7 0.7 - 4.0 K/uL   Monocytes Relative 5 3 - 12 %   Monocytes Absolute 0.4 0.1 - 1.0 K/uL   Eosinophils Relative 5 0 - 5 %   Eosinophils Absolute 0.4 0.0 - 0.7 K/uL   Basophils Relative 0 0 - 1 %   Basophils Absolute 0.0 0.0 - 0.1 K/uL  Lactate dehydrogenase     Status: None   Collection Time: 08/23/14 11:52 PM  Result Value Ref Range   LDH 148 94 - 250 U/L  Sedimentation rate     Status: Abnormal   Collection Time: 08/23/14 11:52 PM  Result Value Ref Range   Sed Rate 67 (H) 0 - 16 mm/hr  Hepatic function panel     Status: Abnormal   Collection Time: 08/23/14 11:52 PM  Result Value Ref Range   Total Protein 7.6 6.0 - 8.3 g/dL   Albumin 3.2 (L) 3.5 - 5.2 g/dL   AST 31 0 -  37 U/L   ALT 26 0 - 53 U/L   Alkaline Phosphatase 100 39 - 117 U/L   Total Bilirubin 0.5 0.3 - 1.2 mg/dL   Bilirubin, Direct <0.1 0.0 - 0.5 mg/dL   Indirect Bilirubin NOT CALCULATED 0.3 - 0.9 mg/dL  Urinalysis with microscopic     Status: Abnormal   Collection Time: 08/24/14 12:54 AM  Result Value Ref Range   Color, Urine YELLOW YELLOW   APPearance CLEAR CLEAR   Specific Gravity, Urine 1.036 (H) 1.005 - 1.030   pH 6.0 5.0 - 8.0   Glucose, UA NEGATIVE NEGATIVE mg/dL   Hgb urine dipstick  NEGATIVE NEGATIVE   Bilirubin Urine NEGATIVE NEGATIVE   Ketones, ur NEGATIVE NEGATIVE mg/dL   Protein, ur NEGATIVE NEGATIVE mg/dL   Urobilinogen, UA 0.2 0.0 - 1.0 mg/dL   Nitrite NEGATIVE NEGATIVE   Leukocytes, UA NEGATIVE NEGATIVE   RBC / HPF 0-2 <3 RBC/hpf   Squamous Epithelial / LPF RARE RARE  Strep pneumoniae urinary antigen     Status: None   Collection Time: 08/24/14 12:54 AM  Result Value Ref Range   Strep Pneumo Urinary Antigen NEGATIVE NEGATIVE    Comment: PERFORMED AT [facility]        Infection due to S. pneumoniae cannot be absolutely ruled out since the antigen present may be below the detection limit of the test. Performed at [facility]   Urine rapid drug screen (hosp performed)     Status: None   Collection Time: 08/24/14 12:54 AM  Result Value Ref Range   Opiates NONE DETECTED NONE DETECTED   Cocaine NONE DETECTED NONE DETECTED   Benzodiazepines NONE DETECTED NONE DETECTED   Amphetamines NONE DETECTED NONE DETECTED   Tetrahydrocannabinol NONE DETECTED NONE DETECTED   Barbiturates NONE DETECTED NONE DETECTED    Comment:        DRUG SCREEN FOR MEDICAL PURPOSES ONLY.  IF CONFIRMATION IS NEEDED FOR ANY PURPOSE, NOTIFY LAB WITHIN 5 DAYS.        LOWEST DETECTABLE LIMITS FOR URINE DRUG SCREEN Drug Class       Cutoff (ng/mL) Amphetamine      1000 Barbiturate      200 Benzodiazepine   433 Tricyclics       295 Opiates          300 Cocaine          300 THC              50   Blood gas, arterial     Status: Abnormal   Collection Time: 08/24/14  8:06 AM  Result Value Ref Range   FIO2 0.21 %   pH, Arterial 7.444 7.350 - 7.450   pCO2 arterial 33.5 (L) 35.0 - 45.0 mmHg   pO2, Arterial 86.6 80.0 - 100.0 mmHg   Bicarbonate 22.6 20.0 - 24.0 mEq/L   TCO2 19.7 0 - 100 mmol/L   Acid-base deficit 0.4 0.0 - 2.0 mmol/L   O2 Saturation 96.9 %   Patient temperature 98.6    Collection site RADIAL    Drawn by [id number]    Sample type ARTERIAL    Allens test  (pass/fail) PASS PASS  Culture, expectorated sputum-assessment     Status: None   Collection Time: 08/24/14  8:44 AM  Result Value Ref Range   Specimen Description SPUTUM    Special Requests NONE    Sputum evaluation      THIS SPECIMEN IS ACCEPTABLE. RESPIRATORY CULTURE REPORT TO FOLLOW.  Report Status 08/24/2014 FINAL     MICRO: 2/28 blood cx pending 2/29 rvp 2/29 resp cx pending  IMAGING: Dg Chest 2 View  08/23/2014   CLINICAL DATA:  Bilateral lower chest pain.  Bilateral pneumonia.  EXAM: CHEST  2 VIEW  COMPARISON:  08/13/2014  FINDINGS: Hyperinflation of the lungs. Increased opacities in both lower lung fields. This is concerning for pneumonia. This has increased slightly since prior study. No effusions. Heart is normal size. No acute bony abnormality.  IMPRESSION: Increasing bilateral lower lobe airspace opacities concerning for pneumonia.   Electronically Signed   By: Rolm Baptise M.D.   On: 08/23/2014 20:35   Ct Angio Chest Pe W/cm &/or Wo Cm  08/23/2014   CLINICAL DATA:  Increasing heart rate. Currently under therapy for pneumonia.  EXAM: CT ANGIOGRAPHY CHEST WITH CONTRAST  TECHNIQUE: Multidetector CT imaging of the chest was performed using the standard protocol during bolus administration of intravenous contrast. Multiplanar CT image reconstructions and MIPs were obtained to evaluate the vascular anatomy.  CONTRAST:  180m OMNIPAQUE IOHEXOL 350 MG/ML SOLN  COMPARISON:  None.  FINDINGS: THORACIC INLET/BODY WALL:  No acute abnormality.  MEDIASTINUM:  Normal heart size. No pericardial effusion. Motion and bolus dispersion decreases sensitivity for detecting pulmonary embolism, but the study is overall diagnostic and negative for pulmonary embolism. No acute aortic findings.  LUNG WINDOWS:  There is patchy ground-glass opacity throughout the lungs, with the basilar predominance. Associated subsegmental atelectasis diffusely. No cavitary change, air bronchogram, or prominent interlobular  septal thickening. No pleural effusion. Lung disease has been present since at least 08/13/2014, and progressive.  UPPER ABDOMEN:  No acute findings.  OSSEOUS:  No acute fracture.  No suspicious lytic or blastic lesions.  Review of the MIP images confirms the above findings.  IMPRESSION: 1. Diffuse airspace disease, progressed over the past 10 days. The pattern suggests atypical/opportunistic infection or inflammatory pneumonia (especially hypersensitivity pneumonitis). 2. No evidence of pulmonary embolism.   Electronically Signed   By: JMonte FantasiaM.D.   On: 08/23/2014 22:25    Assessment/Plan: 89yoM with dyspnea on exertion worse in the last 2 wk but started having symptoms x 3 months who has CT findings concerning for atypical infection given diffuse airspace disease in patient with HIV + ab, likely newly diagnosed hiv disease.  hiv disease = will need confirmation with 4th generation testing, hiv viral load, cd 4 count, genotype. hla [id number]. quantiferon  Atypical pulmonary infection = agree that there is high suspicion for PJP pneumonia, continue with treatment doses of bactrim. For bronchoscopy, recommend DFA stain for PCP, fungal cx, aerobic cx. Will finish out his CAP treatment with levofloxacin for 2 more days and discontinue vancomycin and cefepime. Again favor atypical causes rather than mrsa or pseudomonas pneumonia  Will provide further recs as more information returns.  CElzie RingsSCulverfor Infectious Diseases [phone]

## 2014-08-24 NOTE — Progress Notes (Signed)
ANTIBIOTIC CONSULT NOTE - INITIAL  Pharmacy Consult for HCAP Indication: Vancomycin and Cefepime  No Known Allergies  Patient Measurements: Height: 5\' 9"  (175.3 cm) Weight: 136 lb 1.6 oz (61.735 kg) IBW/kg (Calculated) : 70.7   Vital Signs: Temp: 99 F (37.2 C) (02/29 0044) Temp Source: Oral (02/29 0044) BP: 111/82 mmHg (02/29 0044) Pulse Rate: 114 (02/29 0044) Intake/Output from previous day:   Intake/Output from this shift:    Labs:  Recent Labs  08/23/14 2014 08/23/14 2352  WBC 6.1 7.1  HGB 15.2 14.0  PLT 243 234  CREATININE 1.08  --    Estimated Creatinine Clearance: 78.6 mL/min (by C-G formula based on Cr of 1.08). No results for input(s): VANCOTROUGH, VANCOPEAK, VANCORANDOM, GENTTROUGH, GENTPEAK, GENTRANDOM, TOBRATROUGH, TOBRAPEAK, TOBRARND, AMIKACINPEAK, AMIKACINTROU, AMIKACIN in the last 72 hours.   Microbiology: No results found for this or any previous visit (from the past 720 hour(s)).  Medical History: Past Medical History  Diagnosis Date  . Genital herpes   . Hypertension     Medications:  Scheduled:  . ceFEPime (MAXIPIME) IV  1 g Intravenous Q8H  . enoxaparin (LOVENOX) injection  40 mg Subcutaneous QHS  . valACYclovir  500 mg Oral Daily  . vancomycin  1,000 mg Intravenous Once  . vancomycin  750 mg Intravenous Q8H   Infusions:  . sodium chloride     Assessment: 43 yoM hx of HTN, genital herpes presents to the ED complaining of gradual, persistent, progressively worsening dyspnea on exertion, cough, chest pain onset several weeks ago, worsening in the last several days. Pt reports dx with community PNA in 12/15. He reports symptoms were better after multiple abx and ultimately a course of doxycycline. Beginning of 08/03/14 he began to feel poorly (fever to 102, cough, chest pain, chills) and was diagnosed with PNA again on 08/13/14. Pt reports he finished a course of Augmentin without relief. Pt reports he saw his PCP 3 days ago after  persistent SOB and fevers. He was given albuterol and abx was changed to levaquin. Patient reports he now has SOB and dyspnea on exertion including simply walking from room to room in his house. Pt is a smoker 1ppd until Dec. Cefepime and Vancomycin per Rx   Goal of Therapy:  Vancomycin trough level 15-20 mcg/ml  Plan:   Cefepime 2Gm x1 then 1gm IV q8h   Vancomycin 1Gm x1 then 750mg  IV q8h for PNA.  F/u scr/levels/cultures as needed  Lorenza EvangelistGreen, Averey Trompeter R 08/24/2014,12:51 AM

## 2014-08-24 NOTE — Progress Notes (Signed)
UR complete 

## 2014-08-24 NOTE — Progress Notes (Signed)
Antibiotic Consult Note - Initial  Pharmacy Consult for Bactrim Indication: possible PCP  A/P: Patient already started on vancomycin and cefepime for possible HCAP (see previous note). Now adding Bactrim for possible PCP. Will start 15mg /kg divided q8 (320mg  IV q8). Will monitor CBC, K and SCr while on IV Bactrim  Hessie KnowsJustin M Zakir Henner, PharmD, BCPS Pager [phone] 08/24/2014 8:57 AM

## 2014-08-24 NOTE — Consult Note (Signed)
Name: Casey Hurley MRN: [id number] DOB: 03/15/1971    ADMISSION DATE:  08/23/2014 CONSULTATION DATE: 2/29  REFERRING MD :  Venetia Constable  CHIEF COMPLAINT:  Pulmonary infiltrates   BRIEF PATIENT DESCRIPTION:  44 year old male admitted on 2/28 w/ new dx of HIV and diffuse pulmonary infiltrates.   SIGNIFICANT EVENTS    STUDIES:  Ct chest 2/28: 1. Diffuse airspace disease, progressed over the past 10 days. The pattern suggests atypical/opportunistic infection or inflammatory pneumonia (especially hypersensitivity pneumonitis). 2. No evidence of pulmonary embolism.  HISTORY OF PRESENT ILLNESS:   44 y.o. male with Past medical history of hypertension, genital herpes. Presented 2/28 with complaints of cough and shortness of breath since December. Has had progressively worsening cough associated with shortness of breath and low-grade fever. He denied any significant weight loss. With this he was seen by his PCP who placed him on antibiotics for 2 courses in December.Earlier in February in the beginning his started having complaints of cough, generalized malaise, shortness of breath, dizziness with low-grade fever persisting on a daily basis. He was seen at an urgent care clinic and was also seen by his PCP and has completed 2 more courses of antibiotic.Patient has completed doxycycline, Levaquin, Augmentin, azithromycin courses of antibiotics over this period. Despite this his symptoms has not improved, presented here for further workup. Since admit dx eval has demonstrated: + HIV antibody & CT chest showed diffuse airspace disease. PCCM was asked to see re: his CT chest findings and possible bronch.   PAST MEDICAL HISTORY :   has a past medical history of Genital herpes and Hypertension.  has past surgical history that includes Anal examination under anesthesia. Prior to Admission medications   Medication Sig Start Date End Date Taking? Authorizing Provider  ALPRAZolam Prudy Feeler) 0.5 MG tablet  Take 1 tablet (0.5 mg total) by mouth at bedtime as needed. 12/11/13  Yes Harriette Bouillon, MD  levofloxacin (LEVAQUIN) 750 MG tablet Take 1 tablet by mouth daily. For 10 days 08/20/14  Yes Historical Provider, MD  valACYclovir (VALTREX) 500 MG tablet Take 500 mg by mouth daily.    Yes Historical Provider, MD  lidocaine (XYLOCAINE JELLY) 2 % jelly Apply to affected area daily prn Patient not taking: Reported on 08/23/2014 11/28/13 11/28/14  Harriette Bouillon, MD   No Known Allergies  FAMILY HISTORY:  family history includes Breast cancer in an other family member; Prostate cancer in his father. SOCIAL HISTORY:  reports that he has been smoking Cigarettes.  He has been smoking about 1.00 pack per day. He has never used smokeless tobacco. He reports that he drinks about 1.2 oz of alcohol per week. He reports that he does not use illicit drugs.  REVIEW OF SYSTEMS:   Constitutional: Negative for fever, chills, weight loss, malaise/fatigue and diaphoresis.  HENT: Negative for hearing loss, ear pain, nosebleeds, congestion, sore throat, neck pain, tinnitus and ear discharge.   Eyes: Negative for blurred vision, double vision, photophobia, pain, discharge and redness.  Respiratory:  Cough non-productive, hemoptysis, sputum production, shortness of breath, wheezing and stridor.   Cardiovascular: Negative for chest pain posterior chest, palpitations, orthopnea, claudication, leg swelling and PND.  Gastrointestinal: Negative for heartburn, nausea, vomiting, abdominal pain, diarrhea, constipation, blood in stool and melena.  Genitourinary: Negative for dysuria, urgency, frequency, hematuria and flank pain.  Musculoskeletal: Negative for myalgias, back pain, joint pain and falls.  Skin: Negative for itching and rash.  Neurological: Negative for dizziness, tingling, tremors, sensory change, speech change, focal  weakness, seizures, loss of consciousness, weakness and headaches.  Endo/Heme/Allergies: Negative for  environmental allergies and polydipsia. Does not bruise/bleed easily.  SUBJECTIVE:  No distress  VITAL SIGNS: Temp:  [97.7 F (36.5 C)-99.9 F (37.7 C)] 98.6 F (37 C) (02/29 0530) Pulse Rate:  [106-129] 111 (02/29 0530) Resp:  [16-26] 18 (02/29 0530) BP: (111-133)/(76-92) 123/76 mmHg (02/29 0530) SpO2:  [96 %-100 %] 96 % (02/29 0530) Weight:  [61.689 kg (136 lb)-65.772 kg (145 lb)] 61.689 kg (136 lb) (02/29 0500)  PHYSICAL EXAMINATION: General:  Chronically ill appearing white male in no acute distress.  Neuro:  Alert, oriented, no focal def  HEENT:  [name], no JVD  Cardiovascular:  rrr Lungs:  Crackles both bases  Abdomen: soft, non-tender + bowel sounds  Musculoskeletal:  Intact  Skin:  Intact    Recent Labs Lab 08/23/14 2014  NA 137  K 3.3*  CL 102  CO2 23  BUN 10  CREATININE 1.08  GLUCOSE 112*    Recent Labs Lab 08/23/14 2014 08/23/14 2352  HGB 15.2 14.0  HCT 42.5 40.2  WBC 6.1 7.1  PLT 243 234   Dg Chest 2 View  08/23/2014   CLINICAL DATA:  Bilateral lower chest pain.  Bilateral pneumonia.  EXAM: CHEST  2 VIEW  COMPARISON:  08/13/2014  FINDINGS: Hyperinflation of the lungs. Increased opacities in both lower lung fields. This is concerning for pneumonia. This has increased slightly since prior study. No effusions. Heart is normal size. No acute bony abnormality.  IMPRESSION: Increasing bilateral lower lobe airspace opacities concerning for pneumonia.   Electronically Signed   By: Charlett NoseKevin  Dover M.D.   On: 08/23/2014 20:35   Ct Angio Chest Pe W/cm &/or Wo Cm  08/23/2014   CLINICAL DATA:  Increasing heart rate. Currently under therapy for pneumonia.  EXAM: CT ANGIOGRAPHY CHEST WITH CONTRAST  TECHNIQUE: Multidetector CT imaging of the chest was performed using the standard protocol during bolus administration of intravenous contrast. Multiplanar CT image reconstructions and MIPs were obtained to evaluate the vascular anatomy.  CONTRAST:  100mL OMNIPAQUE IOHEXOL 350  MG/ML SOLN  COMPARISON:  None.  FINDINGS: THORACIC INLET/BODY WALL:  No acute abnormality.  MEDIASTINUM:  Normal heart size. No pericardial effusion. Motion and bolus dispersion decreases sensitivity for detecting pulmonary embolism, but the study is overall diagnostic and negative for pulmonary embolism. No acute aortic findings.  LUNG WINDOWS:  There is patchy ground-glass opacity throughout the lungs, with the basilar predominance. Associated subsegmental atelectasis diffusely. No cavitary change, air bronchogram, or prominent interlobular septal thickening. No pleural effusion. Lung disease has been present since at least 08/13/2014, and progressive.  UPPER ABDOMEN:  No acute findings.  OSSEOUS:  No acute fracture.  No suspicious lytic or blastic lesions.  Review of the MIP images confirms the above findings.  IMPRESSION: 1. Diffuse airspace disease, progressed over the past 10 days. The pattern suggests atypical/opportunistic infection or inflammatory pneumonia (especially hypersensitivity pneumonitis). 2. No evidence of pulmonary embolism.   Electronically Signed   By: Marnee SpringJonathon  Watts M.D.   On: 08/23/2014 22:25    ASSESSMENT / PLAN:  Bilateral pulmonary infiltrates in the setting of newly diagnosed HIV. Agree that this is very concerning for an opportunistic infection Plan Will need FOB planned for 3/1 at 10am   Pulmonary and Critical Care Medicine [facility] Pager: [phone] 08/24/2014, 11:09 AM  Attending Note:  I have examined patient, reviewed labs, studies and notes. I have discussed the case with Kreg ShropshireP Babcock, and  I agree with the data and plans as amended above.   Levy Pupa, MD, PhD 08/24/2014, 5:41 PM [facility] [phone] or if no answer [phone]

## 2014-08-24 NOTE — Progress Notes (Signed)
Tuberculin skin test placed to L upper medial anterior forearm.

## 2014-08-25 ENCOUNTER — Encounter (HOSPITAL_COMMUNITY)
Admission: EM | Disposition: A | Discharge status: Home / Self Care | Payer: Self-pay | Source: Home / Self Care | Attending: Internal Medicine

## 2014-08-25 ENCOUNTER — Inpatient Hospital Stay (HOSPITAL_COMMUNITY): Payer: BC Managed Care – PPO

## 2014-08-25 DIAGNOSIS — B20 Human immunodeficiency virus [HIV] disease: Secondary | ICD-10-CM | POA: Diagnosis present

## 2014-08-25 DIAGNOSIS — Z8701 Personal history of pneumonia (recurrent): Secondary | ICD-10-CM

## 2014-08-25 DIAGNOSIS — R000 Tachycardia, unspecified: Secondary | ICD-10-CM

## 2014-08-25 DIAGNOSIS — I10 Essential (primary) hypertension: Secondary | ICD-10-CM

## 2014-08-25 HISTORY — PX: VIDEO BRONCHOSCOPY: SHX5072

## 2014-08-25 LAB — CD4/CD8 (T-HELPER/T-SUPPRESSOR CELL)
CD4 absolute: 20 /uL — ABNORMAL LOW (ref 500–1900)
CD4%: 2 % — AB (ref 30.0–60.0)
CD8 T CELL ABS: 1290 /uL — AB (ref 230–1000)
CD8tox: 95 % — ABNORMAL HIGH (ref 15.0–40.0)
RATIO: 0.02 — AB (ref 1.0–3.0)
Total lymphocyte count: 1360 /uL (ref 1000–4000)

## 2014-08-25 LAB — LEGIONELLA ANTIGEN, URINE

## 2014-08-25 LAB — COMPREHENSIVE METABOLIC PANEL
ALT: 25 U/L (ref 0–53)
ANION GAP: 9 (ref 5–15)
AST: 27 U/L (ref 0–37)
Albumin: 3.1 g/dL — ABNORMAL LOW (ref 3.5–5.2)
Alkaline Phosphatase: 92 U/L (ref 39–117)
BILIRUBIN TOTAL: 0.2 mg/dL — AB (ref 0.3–1.2)
BUN: 8 mg/dL (ref 6–23)
CALCIUM: 9.1 mg/dL (ref 8.4–10.5)
CHLORIDE: 106 mmol/L (ref 96–112)
CO2: 24 mmol/L (ref 19–32)
CREATININE: 0.91 mg/dL (ref 0.50–1.35)
GFR calc Af Amer: >90 mL/min (ref >90)
GFR calc non Af Amer: >90 mL/min (ref >90)
Glucose, Bld: 145 mg/dL — ABNORMAL HIGH (ref 70–99)
Potassium: 3.8 mmol/L (ref 3.5–5.1)
SODIUM: 139 mmol/L (ref 135–145)
Total Protein: 7.3 g/dL (ref 6.0–8.3)

## 2014-08-25 LAB — CBC WITH DIFFERENTIAL/PLATELET
Basophils Absolute: 0 10*3/uL (ref 0.0–0.1)
Basophils Relative: 0 % (ref 0–1)
Eosinophils Absolute: 0 10*3/uL (ref 0.0–0.7)
Eosinophils Relative: 0 % (ref 0–5)
HCT: 37.4 % — ABNORMAL LOW (ref 39.0–52.0)
HEMOGLOBIN: 13.3 g/dL (ref 13.0–17.0)
Lymphocytes Relative: 16 % (ref 12–46)
Lymphs Abs: 0.9 10*3/uL (ref 0.7–4.0)
MCH: 32 pg (ref 26.0–34.0)
MCHC: 35.6 g/dL (ref 30.0–36.0)
MCV: 89.9 fL (ref 78.0–100.0)
MONOS PCT: 6 % (ref 3–12)
Monocytes Absolute: 0.3 10*3/uL (ref 0.1–1.0)
NEUTROS ABS: 4.2 10*3/uL (ref 1.7–7.7)
NEUTROS PCT: 78 % — AB (ref 43–77)
Platelets: 238 10*3/uL (ref 150–400)
RBC: 4.16 MIL/uL — ABNORMAL LOW (ref 4.22–5.81)
RDW: 12.3 % (ref 11.5–15.5)
WBC: 5.5 10*3/uL (ref 4.0–10.5)

## 2014-08-25 LAB — FANA STAINING PATTERNS

## 2014-08-25 LAB — BODY FLUID CELL COUNT WITH DIFFERENTIAL
Eos, Fluid: 3 %
Lymphs, Fluid: 14 %
Monocyte-Macrophage-Serous Fluid: 74 % (ref 50–90)
NEUTROPHIL FLUID: 9 % (ref 0–25)
WBC FLUID: 28 uL (ref 0–1000)

## 2014-08-25 LAB — HEPATITIS C ANTIBODY: HCV Ab: NEGATIVE

## 2014-08-25 LAB — RPR: RPR Ser Ql: NONREACTIVE

## 2014-08-25 LAB — HEPATITIS B SURFACE ANTIGEN: Hepatitis B Surface Ag: NEGATIVE

## 2014-08-25 LAB — RHEUMATOID FACTOR: Rhuematoid fact SerPl-aCnc: <7 IU/mL (ref 0.0–13.9)

## 2014-08-25 LAB — ANTINUCLEAR ANTIBODIES, IFA

## 2014-08-25 LAB — HEPATITIS A ANTIBODY, TOTAL: Hep A Total Ab: NONREACTIVE

## 2014-08-25 SURGERY — VIDEO BRONCHOSCOPY WITHOUT FLUORO
Anesthesia: Moderate Sedation | Laterality: Bilateral

## 2014-08-25 MED ORDER — MIDAZOLAM HCL 10 MG/2ML IJ SOLN
INTRAMUSCULAR | Status: DC | PRN
  Administered 2014-08-25: 2 mg via INTRAVENOUS
  Administered 2014-08-25: 3 mg via INTRAVENOUS
  Administered 2014-08-25: 2 mg via INTRAVENOUS
  Administered 2014-08-25: 3 mg via INTRAVENOUS

## 2014-08-25 MED ORDER — LIDOCAINE HCL 2 % EX GEL
CUTANEOUS | Status: DC | PRN
  Administered 2014-08-25: 1

## 2014-08-25 MED ORDER — FENTANYL CITRATE 0.05 MG/ML IJ SOLN
INTRAMUSCULAR | Status: DC | PRN
  Administered 2014-08-25 (×3): 50 ug via INTRAVENOUS

## 2014-08-25 MED ORDER — FENTANYL CITRATE 0.05 MG/ML IJ SOLN
INTRAMUSCULAR | Status: AC
  Filled 2014-08-25: qty 4

## 2014-08-25 MED ORDER — LIDOCAINE HCL 2 % EX GEL: 1.0000 "application " | Freq: Once | CUTANEOUS | Status: DC

## 2014-08-25 MED ORDER — FENTANYL CITRATE 0.05 MG/ML IJ SOLN
50.0000 ug | Freq: Once | INTRAMUSCULAR | Status: AC
  Administered 2014-08-25: 50 ug via INTRAVENOUS

## 2014-08-25 MED ORDER — LIDOCAINE HCL 1 % IJ SOLN
INTRAMUSCULAR | Status: DC | PRN
  Administered 2014-08-25: 6 mL via RESPIRATORY_TRACT

## 2014-08-25 MED ORDER — PHENYLEPHRINE HCL 0.25 % NA SOLN
NASAL | Status: DC | PRN
  Administered 2014-08-25: 2 via NASAL

## 2014-08-25 MED ORDER — SODIUM CHLORIDE 0.9 % IV SOLN
INTRAVENOUS | Status: DC
  Administered 2014-08-25 – 2014-08-26 (×2): via INTRAVENOUS

## 2014-08-25 MED ORDER — FENTANYL CITRATE 0.05 MG/ML IJ SOLN
INTRAMUSCULAR | Status: AC
  Filled 2014-08-25: qty 2

## 2014-08-25 MED ORDER — PHENYLEPHRINE HCL 0.25 % NA SOLN: 1.0000 | Freq: Four times a day (QID) | NASAL | Status: DC | PRN

## 2014-08-25 MED ORDER — MIDAZOLAM HCL 10 MG/2ML IJ SOLN
INTRAMUSCULAR | Status: AC
  Filled 2014-08-25: qty 4

## 2014-08-25 NOTE — Progress Notes (Signed)
Video bronchoscopy performed Intervention bronchial washings Pt tolerated well  Jacqulynn CadetHopper, Justin David RRT   Levy Pupaobert Danial Sisley, MD, PhD 08/26/2014, 9:08 AM [facility] [phone] or if no answer [phone]

## 2014-08-25 NOTE — H&P (View-Only) (Signed)
 Name: Casey Hurley MRN: [id number] DOB: 05/29/1971    ADMISSION DATE:  08/23/2014 CONSULTATION DATE: 2/29  REFERRING MD :  ranga  CHIEF COMPLAINT:  Pulmonary infiltrates   BRIEF PATIENT DESCRIPTION:  43 year old male admitted on 2/28 w/ new dx of HIV and diffuse pulmonary infiltrates.   SIGNIFICANT EVENTS    STUDIES:  Ct chest 2/28: 1. Diffuse airspace disease, progressed over the past 10 days. The pattern suggests atypical/opportunistic infection or inflammatory pneumonia (especially hypersensitivity pneumonitis). 2. No evidence of pulmonary embolism.  HISTORY OF PRESENT ILLNESS:   43 y.o. male with Past medical history of hypertension, genital herpes. Presented 2/28 with complaints of cough and shortness of breath since December. Has had progressively worsening cough associated with shortness of breath and low-grade fever. He denied any significant weight loss. With this he was seen by his PCP who placed him on antibiotics for 2 courses in December.Earlier in February in the beginning his started having complaints of cough, generalized malaise, shortness of breath, dizziness with low-grade fever persisting on a daily basis. He was seen at an urgent care clinic and was also seen by his PCP and has completed 2 more courses of antibiotic.Patient has completed doxycycline, Levaquin, Augmentin, azithromycin courses of antibiotics over this period. Despite this his symptoms has not improved, presented here for further workup. Since admit dx eval has demonstrated: + HIV antibody & CT chest showed diffuse airspace disease. PCCM was asked to see re: his CT chest findings and possible bronch.   PAST MEDICAL HISTORY :   has a past medical history of Genital herpes and Hypertension.  has past surgical history that includes Anal examination under anesthesia. Prior to Admission medications   Medication Sig Start Date End Date Taking? Authorizing Provider  ALPRAZolam (XANAX) 0.5 MG tablet  Take 1 tablet (0.5 mg total) by mouth at bedtime as needed. 12/11/13  Yes Thomas Cornett, MD  levofloxacin (LEVAQUIN) 750 MG tablet Take 1 tablet by mouth daily. For 10 days 08/20/14  Yes Historical Provider, MD  valACYclovir (VALTREX) 500 MG tablet Take 500 mg by mouth daily.    Yes Historical Provider, MD  lidocaine (XYLOCAINE JELLY) 2 % jelly Apply to affected area daily prn Patient not taking: Reported on 08/23/2014 11/28/13 11/28/14  Thomas Cornett, MD   No Known Allergies  FAMILY HISTORY:  family history includes Breast cancer in an other family member; Prostate cancer in his father. SOCIAL HISTORY:  reports that he has been smoking Cigarettes.  He has been smoking about 1.00 pack per day. He has never used smokeless tobacco. He reports that he drinks about 1.2 oz of alcohol per week. He reports that he does not use illicit drugs.  REVIEW OF SYSTEMS:   Constitutional: Negative for fever, chills, weight loss, malaise/fatigue and diaphoresis.  HENT: Negative for hearing loss, ear pain, nosebleeds, congestion, sore throat, neck pain, tinnitus and ear discharge.   Eyes: Negative for blurred vision, double vision, photophobia, pain, discharge and redness.  Respiratory:  Cough non-productive, hemoptysis, sputum production, shortness of breath, wheezing and stridor.   Cardiovascular: Negative for chest pain posterior chest, palpitations, orthopnea, claudication, leg swelling and PND.  Gastrointestinal: Negative for heartburn, nausea, vomiting, abdominal pain, diarrhea, constipation, blood in stool and melena.  Genitourinary: Negative for dysuria, urgency, frequency, hematuria and flank pain.  Musculoskeletal: Negative for myalgias, back pain, joint pain and falls.  Skin: Negative for itching and rash.  Neurological: Negative for dizziness, tingling, tremors, sensory change, speech change, focal   weakness, seizures, loss of consciousness, weakness and headaches.  Endo/Heme/Allergies: Negative for  environmental allergies and polydipsia. Does not bruise/bleed easily.  SUBJECTIVE:  No distress  VITAL SIGNS: Temp:  [97.7 F (36.5 C)-99.9 F (37.7 C)] 98.6 F (37 C) (02/29 0530) Pulse Rate:  [106-129] 111 (02/29 0530) Resp:  [16-26] 18 (02/29 0530) BP: (111-133)/(76-92) 123/76 mmHg (02/29 0530) SpO2:  [96 %-100 %] 96 % (02/29 0530) Weight:  [61.689 kg (136 lb)-65.772 kg (145 lb)] 61.689 kg (136 lb) (02/29 0500)  PHYSICAL EXAMINATION: General:  Chronically ill appearing white male in no acute distress.  Neuro:  Alert, oriented, no focal def  HEENT:  [place], no JVD  Cardiovascular:  rrr Lungs:  Crackles both bases  Abdomen: soft, non-tender + bowel sounds  Musculoskeletal:  Intact  Skin:  Intact    Recent Labs Lab 08/23/14 2014  NA 137  K 3.3*  CL 102  CO2 23  BUN 10  CREATININE 1.08  GLUCOSE 112*    Recent Labs Lab 08/23/14 2014 08/23/14 2352  HGB 15.2 14.0  HCT 42.5 40.2  WBC 6.1 7.1  PLT 243 234   Dg Chest 2 View  08/23/2014   CLINICAL DATA:  Bilateral lower chest pain.  Bilateral pneumonia.  EXAM: CHEST  2 VIEW  COMPARISON:  08/13/2014  FINDINGS: Hyperinflation of the lungs. Increased opacities in both lower lung fields. This is concerning for pneumonia. This has increased slightly since prior study. No effusions. Heart is normal size. No acute bony abnormality.  IMPRESSION: Increasing bilateral lower lobe airspace opacities concerning for pneumonia.   Electronically Signed   By: Kevin  Dover M.D.   On: 08/23/2014 20:35   Ct Angio Chest Pe W/cm &/or Wo Cm  08/23/2014   CLINICAL DATA:  Increasing heart rate. Currently under therapy for pneumonia.  EXAM: CT ANGIOGRAPHY CHEST WITH CONTRAST  TECHNIQUE: Multidetector CT imaging of the chest was performed using the standard protocol during bolus administration of intravenous contrast. Multiplanar CT image reconstructions and MIPs were obtained to evaluate the vascular anatomy.  CONTRAST:  100mL OMNIPAQUE IOHEXOL 350  MG/ML SOLN  COMPARISON:  None.  FINDINGS: THORACIC INLET/BODY WALL:  No acute abnormality.  MEDIASTINUM:  Normal heart size. No pericardial effusion. Motion and bolus dispersion decreases sensitivity for detecting pulmonary embolism, but the study is overall diagnostic and negative for pulmonary embolism. No acute aortic findings.  LUNG WINDOWS:  There is patchy ground-glass opacity throughout the lungs, with the basilar predominance. Associated subsegmental atelectasis diffusely. No cavitary change, air bronchogram, or prominent interlobular septal thickening. No pleural effusion. Lung disease has been present since at least 08/13/2014, and progressive.  UPPER ABDOMEN:  No acute findings.  OSSEOUS:  No acute fracture.  No suspicious lytic or blastic lesions.  Review of the MIP images confirms the above findings.  IMPRESSION: 1. Diffuse airspace disease, progressed over the past 10 days. The pattern suggests atypical/opportunistic infection or inflammatory pneumonia (especially hypersensitivity pneumonitis). 2. No evidence of pulmonary embolism.   Electronically Signed   By: Jonathon  Watts M.D.   On: 08/23/2014 22:25    ASSESSMENT / PLAN:  Bilateral pulmonary infiltrates in the setting of newly diagnosed HIV. Agree that this is very concerning for an opportunistic infection Plan Will need FOB planned for 3/1 at 10am   Pulmonary and Critical Care Medicine [facility] Pager: [phone] 08/24/2014, 11:09 AM  Attending Note:  I have examined patient, reviewed labs, studies and notes. I have discussed the case with P Babcock, and   I agree with the data and plans as amended above.   Bethsaida Siegenthaler, MD, PhD 08/24/2014, 5:41 PM [facility] [phone] or if no answer [phone]   

## 2014-08-25 NOTE — Progress Notes (Signed)
[facility] PROGRESS NOTE  RANDEEP BIONDOLILLO ZOX:[id number] DOB: [birth year] DOA: 08/23/2014 PCP: Astrid Divine, MD  Assessment/Plan: #1 recurrent pneumonia/community acquired pneumonia/?? PCP pneumonia Patient with a positive HIV. CD4 count of 20. Patient status post bronchoscopy with washings pending. BAL studies pending. IV antibiotics of vancomycin and cefepime have been changed to Levaquin per ID. Continue Bactrim and prednisone. Urine GC chlamydia pending. ID following and appreciate input and recommendations.  #2 AIDs CD4 count of 20. Viral load pending. Genotype pending. Urine GC chlamydia ordered per infectious diseases. ID following and appreciate input and recommendations.  #3 hypertension Stable.  #4 sinus tachycardia Likely secondary to problem #1. Follow.  #5 prophylaxis Lovenox for DVT prophylaxis.   Code Status: Full Family Communication: Updated patient and parents at bedside. Disposition Plan: Remain inpatient   Consultants:  Pulmonary: Dr. Delton Coombes 08/24/2014  ID: Dr. Drue Second 08/24/2014  Procedures:  Bronchoscopy: Dr. Delton Coombes 08/25/2014  CT angiogram chest 08/23/2014  Chest x-ray 08/23/2014  Antibiotics:  Vancomycin 08/23/2014> 08/25/2014  Cefepime 08/23/2014> 08/25/2014  Bactrim 08/24/2014>  Levaquin 08/25/2014  HPI/Subjective: Patient states he feels better after his bronchoscopy. Patient states some improvement in shortness of breath.  Objective: Filed Vitals:   08/25/14 1330  BP: 118/78  Pulse: 98  Temp: 97.8 F (36.6 C)  Resp: 20    Intake/Output Summary (Last 24 hours) at 08/25/14 1838 Last data filed at 08/25/14 1632  Gross per 24 hour  Intake   1720 ml  Output      2 ml  Net   1718 ml   Filed Weights   08/24/14 0044 08/24/14 0500 08/25/14 0440  Weight: 61.735 kg (136 lb 1.6 oz) 61.689 kg (136 lb) 61.689 kg (136 lb)    Exam:   General:  NAD  Cardiovascular: Tachycardia, RR  Respiratory:  CTAB  Abdomen: Soft, nontender, nondistended, positive bowel sounds.  Musculoskeletal: No clubbing cyanosis or edema.  Data Reviewed: Basic Metabolic Panel:  Recent Labs Lab 08/23/14 2014 08/25/14 0500  NA 137 139  K 3.3* 3.8  CL 102 106  CO2 23 24  GLUCOSE 112* 145*  BUN 10 8  CREATININE 1.08 0.91  CALCIUM 9.1 9.1   Liver Function Tests:  Recent Labs Lab 08/23/14 2352 08/25/14 0500  AST 31 27  ALT 26 25  ALKPHOS 100 92  BILITOT 0.5 0.2*  PROT 7.6 7.3  ALBUMIN 3.2* 3.1*   No results for input(s): LIPASE, AMYLASE in the last 168 hours. No results for input(s): AMMONIA in the last 168 hours. CBC:  Recent Labs Lab 08/23/14 2014 08/23/14 2352 08/25/14 0500  WBC 6.1 7.1 5.5  NEUTROABS  --  4.7 4.2  HGB 15.2 14.0 13.3  HCT 42.5 40.2 37.4*  MCV 89.7 91.0 89.9  PLT 243 234 238   Cardiac Enzymes: No results for input(s): CKTOTAL, CKMB, CKMBINDEX, TROPONINI in the last 168 hours. BNP (last 3 results)  Recent Labs  08/23/14 2014  BNP 9.8    ProBNP (last 3 results) No results for input(s): PROBNP in the last 8760 hours.  CBG: No results for input(s): GLUCAP in the last 168 hours.  Recent Results (from the past 240 hour(s))  Culture, blood (routine x 2)     Status: None (Preliminary result)   Collection Time: 08/23/14 10:41 PM  Result Value Ref Range Status   Specimen Description BLOOD R HAND  Final   Special Requests BOTTLES DRAWN AEROBIC AND ANAEROBIC  Final   Culture   Final  BLOOD CULTURE RECEIVED NO GROWTH TO DATE CULTURE WILL BE HELD FOR 5 DAYS BEFORE ISSUING A FINAL NEGATIVE REPORT Performed at [facility]    Report Status PENDING  Incomplete  Culture, blood (routine x 2)     Status: None (Preliminary result)   Collection Time: 08/23/14 10:41 PM  Result Value Ref Range Status   Specimen Description BLOOD LEFT ANTECUBITAL  Final   Special Requests BOTTLES DRAWN AEROBIC AND ANAEROBIC  Final   Culture   Final            BLOOD CULTURE RECEIVED NO GROWTH TO DATE CULTURE WILL BE HELD FOR 5 DAYS BEFORE ISSUING A FINAL NEGATIVE REPORT Performed at [facility]    Report Status PENDING  Incomplete  Culture, expectorated sputum-assessment     Status: None   Collection Time: 08/24/14  8:44 AM  Result Value Ref Range Status   Specimen Description SPUTUM  Final   Special Requests NONE  Final   Sputum evaluation   Final    THIS SPECIMEN IS ACCEPTABLE. RESPIRATORY CULTURE REPORT TO FOLLOW.   Report Status 08/24/2014 FINAL  Final  Culture, respiratory (NON-Expectorated)     Status: None (Preliminary result)   Collection Time: 08/24/14  8:44 AM  Result Value Ref Range Status   Specimen Description SPUTUM  Final   Special Requests NONE  Final   Gram Stain   Final    FEW WBC PRESENT, PREDOMINANTLY PMN FEW SQUAMOUS EPITHELIAL CELLS PRESENT FEW GRAM POSITIVE COCCI IN PAIRS IN CHAINS IN CLUSTERS FEW GRAM POSITIVE RODS FEW GRAM NEGATIVE RODS    Culture   Final    NORMAL OROPHARYNGEAL FLORA Performed at [facility]    Report Status PENDING  Incomplete     Studies: Dg Chest 2 View  08/23/2014   CLINICAL DATA:  Bilateral lower chest pain.  Bilateral pneumonia.  EXAM: CHEST  2 VIEW  COMPARISON:  08/13/2014  FINDINGS: Hyperinflation of the lungs. Increased opacities in both lower lung fields. This is concerning for pneumonia. This has increased slightly since prior study. No effusions. Heart is normal size. No acute bony abnormality.  IMPRESSION: Increasing bilateral lower lobe airspace opacities concerning for pneumonia.   Electronically Signed   By: Charlett Nose M.D.   On: 08/23/2014 20:35   Ct Angio Chest Pe W/cm &/or Wo Cm  08/23/2014   CLINICAL DATA:  Increasing heart rate. Currently under therapy for pneumonia.  EXAM: CT ANGIOGRAPHY CHEST WITH CONTRAST  TECHNIQUE: Multidetector CT imaging of the chest was performed using the standard protocol during bolus administration of intravenous contrast.  Multiplanar CT image reconstructions and MIPs were obtained to evaluate the vascular anatomy.  CONTRAST:  OMNIPAQUE IOHEXOL 350 MG/ML SOLN  COMPARISON:  None.  FINDINGS: THORACIC INLET/BODY WALL:  No acute abnormality.  MEDIASTINUM:  Normal heart size. No pericardial effusion. Motion and bolus dispersion decreases sensitivity for detecting pulmonary embolism, but the study is overall diagnostic and negative for pulmonary embolism. No acute aortic findings.  LUNG WINDOWS:  There is patchy ground-glass opacity throughout the lungs, with the basilar predominance. Associated subsegmental atelectasis diffusely. No cavitary change, air bronchogram, or prominent interlobular septal thickening. No pleural effusion. Lung disease has been present since at least 08/13/2014, and progressive.  UPPER ABDOMEN:  No acute findings.  OSSEOUS:  No acute fracture.  No suspicious lytic or blastic lesions.  Review of the MIP images confirms the above findings.  IMPRESSION: 1. Diffuse airspace disease, progressed over the  past 10 days. The pattern suggests atypical/opportunistic infection or inflammatory pneumonia (especially hypersensitivity pneumonitis). 2. No evidence of pulmonary embolism.   Electronically Signed   By: Marnee SpringJonathon  Watts M.D.   On: 08/23/2014 22:25    Scheduled Meds: . enoxaparin (LOVENOX) injection  40 mg Subcutaneous QHS  . predniSONE  40 mg Oral BID WC  . sulfamethoxazole-trimethoprim  320 mg Intravenous Q8H  . tuberculin  5 Units Intradermal Once  . valACYclovir  500 mg Oral Daily   Continuous Infusions: . sodium chloride 75 mL/hr at 08/24/14 1421  . sodium chloride 10 mL/hr at 08/25/14 [id number]    Principal Problem:   Recurrent pneumonia Active Problems:   CAP (community acquired pneumonia)   HIV antibody positive   Essential hypertension   Genital herpes   AIDS    Time spent: 40 mins    [facility] PcHOMPSON,Ohn Bostic MD [facility] Pager [phone]. If 7PM-7AM, please contact night-coverage  at [email or web address], password [email or web address] 08/25/2014, 6:38 PM  LOS: 1 day

## 2014-08-25 NOTE — Op Note (Signed)
Video Bronchoscopy Procedure Note  Date of Operation: 08/25/2014  Pre-op Diagnosis: PNA  Post-op Diagnosis: Same  Surgeon: Levy PupaOBERT BYRUM  Assistants: none  Anesthesia: conscious sedation, moderate sedation  Meds Given: fentanyl 200mcg, versed 10mg  in divided doses, 1% lidocaine 25cc total  Operation: Flexible video fiberoptic bronchoscopy and biopsies.  Estimated Blood Loss: none  Complications: none noted  Indications and History: Casey Hurley is 44 y.o. with history of newly diagnosed HIV in the setting of B pneumonia.  Recommendation was to perform video fiberoptic bronchoscopy to obtain culture information. The risks, benefits, complications, treatment options and expected outcomes were discussed with the patient.  The possibilities of pneumothorax, pneumonia, reaction to medication, pulmonary aspiration, perforation of a viscus, bleeding, failure to diagnose a condition and creating a complication requiring transfusion or operation were discussed with the patient who freely signed the consent.    Description of Procedure: The patient was seen in the Preoperative Area, was examined and was deemed appropriate to proceed.  [facility], identified as Casey Hurley and the procedure verified as Flexible Video Fiberoptic Bronchoscopy.  A Time Out was held and the above information confirmed.   Conscious sedation was initiated as indicated above. The video fiberoptic bronchoscope was introduced via the OP and a general inspection was performed which showed normal cords, normal trachea, normal main carina. The R sided airways were inspected and showed normal RUL, BI, RML and RLL. The L side was then inspected. The LLL, Lingular and LUL airways were normal. There were no endobronchial lesions and no secretions present.   A BAL was performed in the RML with 60cc NS instilled and approximately 20cc returned. This will be sent for microbiology.  The patient  tolerated the procedure well. The bronchoscope was removed. There was no significant bleeding, were no obvious complications.   Samples: 1. RML bronchioalveolar lavage   Plans:  We will review the microbiology results with the patient when they become available.     Levy Pupaobert Byrum, MD, PhD 08/25/2014, 11:06 AM [facility] [phone] or if no answer [phone]

## 2014-08-25 NOTE — Progress Notes (Signed)
INFECTIOUS DISEASE PROGRESS NOTE  ID: Casey Hurley is a 44 y.o. male with  Principal Problem:   Recurrent pneumonia Active Problems:   CAP (community acquired pneumonia)   HIV antibody positive   Essential hypertension   Genital herpes  Subjective: No SOB. Tachycardia with exertion.   Abtx:  Anti-infectives    Start     Dose/Rate Route Frequency Ordered Stop   08/24/14 1300  levofloxacin (LEVAQUIN) IVPB 750 mg     750 mg 100 mL/hr over 90 Minutes Intravenous Every 24 hours 08/24/14 1156 08/25/14 1340   08/24/14 1000  valACYclovir (VALTREX) tablet 500 mg     500 mg Oral Daily 08/24/14 0026     08/24/14 1000  vancomycin (VANCOCIN) IVPB 750 mg/150 ml premix  Status:  Discontinued     750 mg 150 mL/hr over 60 Minutes Intravenous Every 8 hours 08/24/14 0050 08/24/14 1156   08/24/14 1000  sulfamethoxazole-trimethoprim (BACTRIM) 320 mg in dextrose 5 % 500 mL IVPB     320 mg 346.7 mL/hr over 90 Minutes Intravenous Every 8 hours 08/24/14 0900     08/24/14 0800  ceFEPIme (MAXIPIME) 1 g in dextrose 5 % 50 mL IVPB  Status:  Discontinued     1 g 100 mL/hr over 30 Minutes Intravenous Every 8 hours 08/24/14 0026 08/24/14 1156   08/23/14 2315  ceFEPIme (MAXIPIME) 2 g in dextrose 5 % 50 mL IVPB     2 g 100 mL/hr over 30 Minutes Intravenous  Once 08/23/14 2307 08/24/14 0016   08/23/14 2315  vancomycin (VANCOCIN) IVPB 1000 mg/200 mL premix     1,000 mg 200 mL/hr over 60 Minutes Intravenous  Once 08/23/14 2307 08/24/14 0217      Medications:  Scheduled: . enoxaparin (LOVENOX) injection  40 mg Subcutaneous QHS  . predniSONE  40 mg Oral BID WC  . sulfamethoxazole-trimethoprim  320 mg Intravenous Q8H  . tuberculin  5 Units Intradermal Once  . valACYclovir  500 mg Oral Daily    Objective: Vital signs in last 24 hours: Temp:  [97.8 F (36.6 C)-98.5 F (36.9 C)] 97.8 F (36.6 C) (03/01 1330) Pulse Rate:  [92-123] 98 (03/01 1330) Resp:  [15-22] 20 (03/01 1330) BP:  (106-146)/(58-92) 118/78 mmHg (03/01 1330) SpO2:  [91 %-99 %] 98 % (03/01 1330) Weight:  [61.689 kg (136 lb)] 61.689 kg (136 lb) (03/01 0440)   General appearance: alert, cooperative and no distress Resp: diminished breath sounds bilaterally Cardio: tacnycardia GI: normal findings: bowel sounds normal and soft, non-tender  Lab Results  Recent Labs  08/23/14 2014 08/23/14 2352 08/25/14 0500  WBC 6.1 7.1 5.5  HGB 15.2 14.0 13.3  HCT 42.5 40.2 37.4*  NA 137  --  139  K 3.3*  --  3.8  CL 102  --  106  CO2 23  --  24  BUN 10  --  8  CREATININE 1.08  --  0.91   Liver Panel  Recent Labs  08/23/14 2352 08/25/14 0500  PROT 7.6 7.3  ALBUMIN 3.2* 3.1*  AST 31 27  ALT 26 25  ALKPHOS 100 92  BILITOT 0.5 0.2*  BILIDIR <0.1  --   IBILI NOT CALCULATED  --    Sedimentation Rate  Recent Labs  08/23/14 2352  ESRSEDRATE 67*   C-Reactive Protein  Recent Labs  08/23/14 2352  CRP 1.2*    Microbiology: Recent Results (from the past 240 hour(s))  Culture, blood (routine x 2)  Status: None (Preliminary result)   Collection Time: 08/23/14 10:41 PM  Result Value Ref Range Status   Specimen Description BLOOD R HAND  Final   Special Requests BOTTLES DRAWN AEROBIC AND ANAEROBIC 5ML  Final   Culture   Final           BLOOD CULTURE RECEIVED NO GROWTH TO DATE CULTURE WILL BE HELD FOR 5 DAYS BEFORE ISSUING A FINAL NEGATIVE REPORT Performed at [facility]    Report Status PENDING  Incomplete  Culture, blood (routine x 2)     Status: None (Preliminary result)   Collection Time: 08/23/14 10:41 PM  Result Value Ref Range Status   Specimen Description BLOOD LEFT ANTECUBITAL  Final   Special Requests BOTTLES DRAWN AEROBIC AND ANAEROBIC 3ML  Final   Culture   Final           BLOOD CULTURE RECEIVED NO GROWTH TO DATE CULTURE WILL BE HELD FOR 5 DAYS BEFORE ISSUING A FINAL NEGATIVE REPORT Performed at [facility]    Report Status PENDING  Incomplete  Culture,  expectorated sputum-assessment     Status: None   Collection Time: 08/24/14  8:44 AM  Result Value Ref Range Status   Specimen Description SPUTUM  Final   Special Requests NONE  Final   Sputum evaluation   Final    THIS SPECIMEN IS ACCEPTABLE. RESPIRATORY CULTURE REPORT TO FOLLOW.   Report Status 08/24/2014 FINAL  Final  Culture, respiratory (NON-Expectorated)     Status: None (Preliminary result)   Collection Time: 08/24/14  8:44 AM  Result Value Ref Range Status   Specimen Description SPUTUM  Final   Special Requests NONE  Final   Gram Stain   Final    FEW WBC PRESENT, PREDOMINANTLY PMN FEW SQUAMOUS EPITHELIAL CELLS PRESENT FEW GRAM POSITIVE COCCI IN PAIRS IN CHAINS IN CLUSTERS FEW GRAM POSITIVE RODS FEW GRAM NEGATIVE RODS    Culture   Final    NORMAL OROPHARYNGEAL FLORA Performed at [facility]    Report Status PENDING  Incomplete    Studies/Results: Dg Chest 2 View  08/23/2014   CLINICAL DATA:  Bilateral lower chest pain.  Bilateral pneumonia.  EXAM: CHEST  2 VIEW  COMPARISON:  08/13/2014  FINDINGS: Hyperinflation of the lungs. Increased opacities in both lower lung fields. This is concerning for pneumonia. This has increased slightly since prior study. No effusions. Heart is normal size. No acute bony abnormality.  IMPRESSION: Increasing bilateral lower lobe airspace opacities concerning for pneumonia.   Electronically Signed   By: Rolm Baptise M.D.   On: 08/23/2014 20:35   Ct Angio Chest Pe W/cm &/or Wo Cm  08/23/2014   CLINICAL DATA:  Increasing heart rate. Currently under therapy for pneumonia.  EXAM: CT ANGIOGRAPHY CHEST WITH CONTRAST  TECHNIQUE: Multidetector CT imaging of the chest was performed using the standard protocol during bolus administration of intravenous contrast. Multiplanar CT image reconstructions and MIPs were obtained to evaluate the vascular anatomy.  CONTRAST:  139m OMNIPAQUE IOHEXOL 350 MG/ML SOLN  COMPARISON:  None.  FINDINGS: THORACIC  INLET/BODY WALL:  No acute abnormality.  MEDIASTINUM:  Normal heart size. No pericardial effusion. Motion and bolus dispersion decreases sensitivity for detecting pulmonary embolism, but the study is overall diagnostic and negative for pulmonary embolism. No acute aortic findings.  LUNG WINDOWS:  There is patchy ground-glass opacity throughout the lungs, with the basilar predominance. Associated subsegmental atelectasis diffusely. No cavitary change, air bronchogram, or prominent interlobular septal thickening.  No pleural effusion. Lung disease has been present since at least 08/13/2014, and progressive.  UPPER ABDOMEN:  No acute findings.  OSSEOUS:  No acute fracture.  No suspicious lytic or blastic lesions.  Review of the MIP images confirms the above findings.  IMPRESSION: 1. Diffuse airspace disease, progressed over the past 10 days. The pattern suggests atypical/opportunistic infection or inflammatory pneumonia (especially hypersensitivity pneumonitis). 2. No evidence of pulmonary embolism.   Electronically Signed   By: Monte Fantasia M.D.   On: 08/23/2014 22:25     Assessment/Plan: AIDS Suspected PCP  Total days of antibiotics: 3 (vanco/cefepime --> levaquin, bactrim/prednisone)  Explained CD4, VL, risk of transmission, PrEP, condom use, adherence to pt and his wife    Await BAL studies from today Await HIV RNA, genotype, HLA testing.  Check urine gc/chalmydia Check lipids Has gotten flu, pnvx, PPD Hopefully will start ART soon Wife tested today by her PCP Lady Deutscher)  [facility] (pager) [phone] [email or web address] 08/25/2014, 4:25 PM  LOS: 1 day

## 2014-08-25 NOTE — Interval H&P Note (Signed)
For FOB today, all questions answered.  No barriers. Anticipate BAL for broad micro eval.   Levy Pupaobert Byrum, MD, PhD 08/25/2014, 10:37 AM [facility] [phone] or if no answer [phone]

## 2014-08-26 ENCOUNTER — Encounter (HOSPITAL_COMMUNITY): Payer: Self-pay | Admitting: Emergency Medicine

## 2014-08-26 DIAGNOSIS — J9621 Acute and chronic respiratory failure with hypoxia: Secondary | ICD-10-CM

## 2014-08-26 DIAGNOSIS — B59 Pneumocystosis: Secondary | ICD-10-CM

## 2014-08-26 LAB — LIPID PANEL
CHOL/HDL RATIO: 6.5 ratio
Cholesterol: 130 mg/dL (ref 0–200)
HDL: 20 mg/dL — ABNORMAL LOW (ref >39)
LDL Cholesterol: 83 mg/dL (ref 0–99)
TRIGLYCERIDES: 133 mg/dL (ref <150)
VLDL: 27 mg/dL (ref 0–40)

## 2014-08-26 LAB — PNEUMOCYSTIS JIROVECI SMEAR BY DFA
Pneumocystis jiroveci Ag: NEGATIVE
Pneumocystis jiroveci Ag: POSITIVE

## 2014-08-26 LAB — CBC
HEMATOCRIT: 37.4 % — AB (ref 39.0–52.0)
HEMOGLOBIN: 12.9 g/dL — AB (ref 13.0–17.0)
MCH: 31.2 pg (ref 26.0–34.0)
MCHC: 34.5 g/dL (ref 30.0–36.0)
MCV: 90.3 fL (ref 78.0–100.0)
Platelets: 264 10*3/uL (ref 150–400)
RBC: 4.14 MIL/uL — ABNORMAL LOW (ref 4.22–5.81)
RDW: 12.5 % (ref 11.5–15.5)
WBC: 9.6 10*3/uL (ref 4.0–10.5)

## 2014-08-26 LAB — BASIC METABOLIC PANEL
Anion gap: 5 (ref 5–15)
BUN: 9 mg/dL (ref 6–23)
CALCIUM: 9 mg/dL (ref 8.4–10.5)
CO2: 26 mmol/L (ref 19–32)
Chloride: 107 mmol/L (ref 96–112)
Creatinine, Ser: 0.98 mg/dL (ref 0.50–1.35)
GFR calc Af Amer: >90 mL/min (ref >90)
Glucose, Bld: 122 mg/dL — ABNORMAL HIGH (ref 70–99)
POTASSIUM: 3.9 mmol/L (ref 3.5–5.1)
Sodium: 138 mmol/L (ref 135–145)

## 2014-08-26 LAB — HIV ANTIBODY (ROUTINE TESTING W REFLEX): HIV SCREEN 4TH GENERATION: REACTIVE — AB

## 2014-08-26 LAB — RESPIRATORY VIRUS PANEL
Adenovirus: NEGATIVE
INFLUENZA B 1: NEGATIVE
Influenza A: NEGATIVE
Metapneumovirus: NEGATIVE
Parainfluenza 1: NEGATIVE
Parainfluenza 2: NEGATIVE
Parainfluenza 3: NEGATIVE
RESPIRATORY SYNCYTIAL VIRUS A: NEGATIVE
Respiratory Syncytial Virus B: NEGATIVE
Rhinovirus: NEGATIVE

## 2014-08-26 LAB — CULTURE, RESPIRATORY W GRAM STAIN

## 2014-08-26 LAB — HIV 1/2 AB DIFFERENTIATION
HIV 1 Ab: REACTIVE — AB
HIV 2 AB: NONREACTIVE

## 2014-08-26 LAB — CULTURE, RESPIRATORY: CULTURE: NORMAL

## 2014-08-26 LAB — GC/CHLAMYDIA PROBE AMP (~~LOC~~) NOT AT ARMC
Chlamydia: NEGATIVE
Neisseria Gonorrhea: NEGATIVE

## 2014-08-26 LAB — HEPATITIS B SURFACE ANTIBODY, QUANTITATIVE

## 2014-08-26 MED ORDER — SULFAMETHOXAZOLE-TRIMETHOPRIM 800-160 MG PO TABS
2.0000 | ORAL_TABLET | Freq: Three times a day (TID) | ORAL | Status: DC
  Administered 2014-08-26 – 2014-08-28 (×6): 2 via ORAL
  Filled 2014-08-26 (×8): qty 2

## 2014-08-26 MED ORDER — AZITHROMYCIN 600 MG PO TABS
1200.0000 mg | ORAL_TABLET | ORAL | Status: DC
  Administered 2014-08-26: 1200 mg via ORAL
  Filled 2014-08-26: qty 2

## 2014-08-26 NOTE — Progress Notes (Signed)
   Name: Casey Hurley MRN: [id number] DOB: 1970-08-30    ADMISSION DATE:  08/23/2014 CONSULTATION DATE: 2/29  REFERRING MD :  Venetia Constableanga  CHIEF COMPLAINT:  Pulmonary infiltrates   BRIEF PATIENT DESCRIPTION:  44 year old male admitted on 2/28 w/ new dx of HIV and diffuse pulmonary infiltrates.   SIGNIFICANT EVENTS  FOB on 3/1  STUDIES:  Ct chest 2/28: 1. Diffuse airspace disease, progressed over the past 10 days. The pattern suggests atypical/opportunistic infection or inflammatory pneumonia (especially hypersensitivity pneumonitis). 2. No evidence of pulmonary embolism.  HISTORY OF PRESENT ILLNESS:   44 y.o. male with Past medical history of hypertension, genital herpes. Presented 2/28 with complaints of cough and shortness of breath since December. Has had progressively worsening cough associated with shortness of breath and low-grade fever. He denied any significant weight loss. With this he was seen by his PCP who placed him on antibiotics for 2 courses in December.Earlier in February in the beginning his started having complaints of cough, generalized malaise, shortness of breath, dizziness with low-grade fever persisting on a daily basis. He was seen at an urgent care clinic and was also seen by his PCP and has completed 2 more courses of antibiotic.Patient has completed doxycycline, Levaquin, Augmentin, azithromycin courses of antibiotics over this period. Despite this his symptoms has not improved, presented here for further workup. Since admit dx eval has demonstrated: + HIV antibody & CT chest showed diffuse airspace disease. PCCM was asked to see re: his CT chest findings and possible bronch.   INTERVAL EVENTS:  No distress  Note that PCP smear is POSITIVE from 3/1  VITAL SIGNS: Temp:  [97.8 F (36.6 C)-98.5 F (36.9 C)] 98 F (36.7 C) (03/01 2140) Pulse Rate:  [93-123] 101 (03/01 2140) Resp:  [15-22] 18 (03/01 2140) BP: (106-146)/(53-92) 119/53 mmHg (03/01 2140) SpO2:   [91 %-99 %] 94 % (03/01 2140) Weight:  [61.1 kg (134 lb 11.2 oz)] 61.1 kg (134 lb 11.2 oz) (03/02 0649)  PHYSICAL EXAMINATION: General:  Chronically ill appearing white male in no acute distress.  Neuro:  Alert, oriented, no focal def  HEENT:  [place], no JVD  Cardiovascular:  rrr Lungs:  Crackles both bases  Abdomen: soft, non-tender + bowel sounds  Musculoskeletal:  Intact  Skin:  Intact    Recent Labs Lab 08/23/14 2014 08/25/14 0500 08/26/14 0555  NA 137 139 138  K 3.3* 3.8 3.9  CL 102 106 107  CO2 23 24 26   BUN 10 8 9   CREATININE 1.08 0.91 0.98  GLUCOSE 112* 145* 122*    Recent Labs Lab 08/23/14 2352 08/25/14 0500 08/26/14 0555  HGB 14.0 13.3 12.9*  HCT 40.2 37.4* 37.4*  WBC 7.1 5.5 9.6  PLT 234 238 264   No results found.  ASSESSMENT / PLAN: New dx of HIV PCP Pneumonia  Plan Recommend continuing Bactrim + steroids Would d/c levaquin, follow cx data for any other superimposed infxn  Please call if PCCM can help you any further.    Levy Pupaobert Jock Mahon, MD, PhD 08/26/2014, 9:20 AM [facility] [phone] or if no answer [phone]

## 2014-08-26 NOTE — Progress Notes (Signed)
TB skin test non-reactive.

## 2014-08-26 NOTE — Progress Notes (Addendum)
Patient ID: Casey Hurley, male   DOB: [birth year], 44 y.o.   MRN: [id number] [facility] PROGRESS NOTE  Casey Hurley WUJ:[id number] DOB: 23-Sep-1970 DOA: 08/23/2014 PCP: Astrid Divine, MD  Brief narrative:    44 y.o. male who presented to [facility] with progressive dyspnea on exertion, night sweats, low-grade fevers for last 10 days prior to this admission. Patient reported having similar symptoms back in December 2015 and was previously treated with antibiotics in December. Patient reported that his shortness of breath has worsened since then and now he apparently was not able to ambulate even short distances without becoming short of breath. He was on Levaquin prior to the admission. On admission, patient was found to have positive HIV with CD4 count of 20. Patient underwent bronchoscopy with washings. The chest x-ray on the admission showed increasing bilateral lower lobe airspace opacities concerning for pneumonia. Pulmonary and infectious disease on board.   Assessment/Plan:    Principal problem: Acute respiratory failure with hypoxia / PCP pneumonia - Patient saturating 91% with nasal cannula oxygen support. Patient was found to have positive HIV with CD4 count of 20. - Patient underwent bronchoscopy with lavage. BAL culture showed normal flora, no fungus identified. PCP was positive. Blood cultures to date are negative. Legionella is negative. Strep pneumonia is negative. - We will continue Bactrim and prednisone for now. - Please noted that patient was on vancomycin and cefepime on admission which was then changed to Levaquin. Pulmonary stopped Levaquin today.  Active problems: HIV/AIDS - HIV test positive. CD4 count 20. - Viral load is pending. The genotype is pending. - Patient has received tuberculin injection. - Hepatitis panel negative. RPR nonreactive. - Appreciate infectious disease following.  Essential hypertension - Blood pressure  stable.  Sinus tachycardia - Likely secondary to pneumonia - Continue to monitor on telemetry    DVT Prophylaxis  - Lovenox subcutaneous ordered   Code Status: Full.  Family Communication:  plan of care discussed with the patient Disposition Plan: Discharge likely in next 24 hours. Awaiting final reports from bronchoscopy.  IV access:  Peripheral IV  Procedures and diagnostic studies:    Dg Chest 2 View 08/23/2014   Increasing bilateral lower lobe airspace opacities concerning for pneumonia.     Ct Angio Chest Pe W/cm &/or Wo Cm 08/23/2014   1. Diffuse airspace disease, progressed over the past 10 days. The pattern suggests atypical/opportunistic infection or inflammatory pneumonia (especially hypersensitivity pneumonitis). 2. No evidence of pulmonary embolism.     Bronchoscopy: Dr. Delton Coombes 08/25/2014  Medical Consultants:  Pulmonary: Dr. Delton Coombes 08/24/2014 ID: Dr. Drue Second 08/24/2014  Other Consultants:   IAnti-Infectives:   Vancomycin 08/23/2014--> 08/25/2014 Cefepime 08/23/2014 --> 08/25/2014 Bactrim 08/24/2014 --> Levaquin 08/25/2014 --> 08/26/2014   Manson Passey, MD  [facility] Pager [phone]  If 7PM-7AM, please contact night-coverage [email or web address] Password [facility] 08/26/2014, 11:32 AM   LOS: 2 days    HPI/Subjective: No acute overnight events.  Objective: Filed Vitals:   08/25/14 1145 08/25/14 1330 08/25/14 2140 08/26/14 0649  BP: 125/61 118/78 119/53   Pulse: 96 98 101   Temp:  97.8 F (36.6 C) 98 F (36.7 C)   TempSrc:  Oral Oral   Resp: Height:      Weight:    61.1 kg (134 lb 11.2 oz)  SpO2: 92% 98% 94%     Intake/Output Summary (Last 24 hours) at 08/26/14 1132 Last data filed at 08/26/14 1100  Gross per 24 hour  Intake 3666.25 ml  Output      4 ml  Net 3662.25 ml    Exam:   General:  Pt is alert, follows commands appropriately, not in acute distress  Cardiovascular: Regular rate and rhythm, S1/S2, no  murmurs  Respiratory: Clear to auscultation bilaterally, no wheezing, no crackles, no rhonchi  Abdomen: Soft, non tender, non distended, bowel sounds present  Extremities: No edema, pulses DP and PT palpable bilaterally  Neuro: Grossly nonfocal  Data Reviewed: Basic Metabolic Panel:  Recent Labs Lab 08/23/14 2014 08/25/14 0500 08/26/14 0555  NA 137 139 138  K 3.3* 3.8 3.9  CL 102 106 107  CO2 23 24 26   GLUCOSE 112* 145* 122*  BUN 10 8 9   CREATININE 1.08 0.91 0.98  CALCIUM 9.1 9.1 9.0   Liver Function Tests:  Recent Labs Lab 08/23/14 2352 08/25/14 0500  AST 31 27  ALT 26 25  ALKPHOS 100 92  BILITOT 0.5 0.2*  PROT 7.6 7.3  ALBUMIN 3.2* 3.1*   No results for input(s): LIPASE, AMYLASE in the last 168 hours. No results for input(s): AMMONIA in the last 168 hours. CBC:  Recent Labs Lab 08/23/14 2014 08/23/14 2352 08/25/14 0500 08/26/14 0555  WBC 6.1 7.1 5.5 9.6  NEUTROABS  --  4.7 4.2  --   HGB 15.2 14.0 13.3 12.9*  HCT 42.5 40.2 37.4* 37.4*  MCV 89.7 91.0 89.9 90.3  PLT 243 234 238 264   Cardiac Enzymes: No results for input(s): CKTOTAL, CKMB, CKMBINDEX, TROPONINI in the last 168 hours. BNP: Invalid input(s): POCBNP CBG: No results for input(s): GLUCAP in the last 168 hours.  Culture, blood (routine x 2)     Status: None (Preliminary result)   Collection Time: 08/23/14 10:41 PM  Result Value Ref Range Status   Specimen Description BLOOD R HAND  Final   Culture   Final           BLOOD CULTURE RECEIVED NO GROWTH TO DATE    Report Status PENDING  Incomplete  Culture, blood (routine x 2)     Status: None (Preliminary result)   Collection Time: 08/23/14 10:41 PM  Result Value Ref Range Status   Specimen Description BLOOD LEFT ANTECUBITAL  Final   Culture   Final           BLOOD CULTURE RECEIVED NO GROWTH TO DATE    Report Status PENDING  Incomplete  Culture, expectorated sputum-assessment     Status: None   Collection Time: 08/24/14  8:44 AM   Result Value Ref Range Status   Specimen Description SPUTUM  Final   Special Requests NONE  Final   Sputum evaluation   Final    THIS SPECIMEN IS ACCEPTABLE. RESPIRATORY CULTURE REPORT TO FOLLOW.   Report Status 08/24/2014 FINAL  Final  Culture, respiratory (NON-Expectorated)     Status: None   Collection Time: 08/24/14  8:44 AM  Result Value Ref Range Status   Specimen Description SPUTUM  Final   Special Requests NONE  Final   Gram Stain   Final   Culture   Final    NORMAL OROPHARYNGEAL FLORA   Report Status 08/26/2014 FINAL  Final  Pneumocystis smear by DFA     Status: None   Collection Time: 08/25/14  8:40 AM  Result Value Ref Range Status   Specimen Source-PJSRC SPUTUM  Final   Pneumocystis jiroveci Ag NEGATIVE  Final    Comment[facility]  Culture, bal-quantitative  Status: None (Preliminary result)   Collection Time: 08/25/14 11:10 AM  Result Value Ref Range Status   Specimen Description BRONCHIAL ALVEOLAR LAVAGE  Final   Special Requests Immunocompromis  Final   Gram Stain   Final    RARE WBC PRESENT, PREDOMINANTLY PMN RARE SQUAMOUS EPITHELIAL CELLS PRESENT NO ORGANISMS SEEN   Colony Count   Final   Culture   Final    Non-Pathogenic Oropharyngeal-type Flora Isolated.   Report Status PENDING  Incomplete  Fungus Culture with Smear     Status: None (Preliminary result)   Collection Time: 08/25/14 11:10 AM  Result Value Ref Range Status   Specimen Description BRONCHIAL ALVEOLAR LAVAGE  Final   Special Requests Immunocompromised  Final   Fungal Smear   Final    NO YEAST OR FUNGAL ELEMENTS SEEN Performed at [facility]    Culture   Final    CULTURE IN PROGRESS FOR FOUR WEEKS Performed at [facility]    Report Status PENDING  Incomplete  Pneumocystis smear by DFA     Status: None   Collection Time: 08/25/14 11:10 AM  Result Value Ref Range Status   Specimen Source-PJSRC BRONCHIAL ALVEOLAR LAVAGE  Final   Pneumocystis  jiroveci Ag POSITIVE  Final    Comment: Performed at [facility]     Scheduled Meds: . enoxaparin (LOVENOX) injection  40 mg Subcutaneous QHS  . predniSONE  40 mg Oral BID WC  . sulfamethoxazole-trimethoprim  320 mg Intravenous Q8H  . tuberculin  5 Units Intradermal Once  . valACYclovir  500 mg Oral Daily   Continuous Infusions: . sodium chloride 10 mL/hr at 08/25/14 0957

## 2014-08-26 NOTE — Progress Notes (Signed)
Infection Prevention called and stated that there was no longer a need for droplet precautions, due to respiratory virus panel being negative.  Droplet precautions discontinued.

## 2014-08-26 NOTE — Progress Notes (Signed)
INFECTIOUS DISEASE PROGRESS NOTE  ID: Casey Hurley is a 44 y.o. male with  Principal Problem:   Recurrent pneumonia Active Problems:   CAP (community acquired pneumonia)   HIV antibody positive   Essential hypertension   Genital herpes   AIDS   Tachycardia   Acute on chronic respiratory failure with hypoxia   PCP (pneumocystis carinii pneumonia)  Subjective: Continued tachycardia, DOE  Abtx:  Anti-infectives    Start     Dose/Rate Route Frequency Ordered Stop   08/24/14 1300  levofloxacin (LEVAQUIN) IVPB 750 mg     750 mg 100 mL/hr over 90 Minutes Intravenous Every 24 hours 08/24/14 1156 08/25/14 1340   08/24/14 1000  valACYclovir (VALTREX) tablet 500 mg     500 mg Oral Daily 08/24/14 0026     08/24/14 1000  vancomycin (VANCOCIN) IVPB 750 mg/150 ml premix  Status:  Discontinued     750 mg 150 mL/hr over 60 Minutes Intravenous Every 8 hours 08/24/14 0050 08/24/14 1156   08/24/14 1000  sulfamethoxazole-trimethoprim (BACTRIM) 320 mg in dextrose 5 % 500 mL IVPB     320 mg 346.7 mL/hr over 90 Minutes Intravenous Every 8 hours 08/24/14 0900     08/24/14 0800  ceFEPIme (MAXIPIME) 1 g in dextrose 5 % 50 mL IVPB  Status:  Discontinued     1 g 100 mL/hr over 30 Minutes Intravenous Every 8 hours 08/24/14 0026 08/24/14 1156   08/23/14 2315  ceFEPIme (MAXIPIME) 2 g in dextrose 5 % 50 mL IVPB     2 g 100 mL/hr over 30 Minutes Intravenous  Once 08/23/14 2307 08/24/14 0016   08/23/14 2315  vancomycin (VANCOCIN) IVPB 1000 mg/200 mL premix     1,000 mg 200 mL/hr over 60 Minutes Intravenous  Once 08/23/14 2307 08/24/14 0217      Medications:  Scheduled: . azithromycin  1,200 mg Oral Weekly  . enoxaparin (LOVENOX) injection  40 mg Subcutaneous QHS  . predniSONE  40 mg Oral BID WC  . sulfamethoxazole-trimethoprim  2 tablet Oral Q8H  . valACYclovir  500 mg Oral Daily    Objective: Vital signs in last 24 hours: Temp:  [98 F (36.7 C)-98.7 F (37.1 C)] 98.7 F (37.1 C)  (03/02 1541) Pulse Rate:  [101-104] 104 (03/02 1541) Resp:  [18-20] 20 (03/02 1541) BP: (112-126)/(53-81) 126/81 mmHg (03/02 1541) SpO2:  [94 %-97 %] 97 % (03/02 1541) Weight:  [61.1 kg (134 lb 11.2 oz)] 61.1 kg (134 lb 11.2 oz) (03/02 1541)   General appearance: alert, cooperative and no distress Resp: diminished breath sounds bilaterally Cardio: regular rate and rhythm GI: normal findings: bowel sounds normal and soft, non-tender  Lab Results  Recent Labs  08/25/14 0500 08/26/14 0555  WBC 5.5 9.6  HGB 13.3 12.9*  HCT 37.4* 37.4*  NA 139 138  K 3.8 3.9  CL 106 107  CO2 24 26  BUN 8 9  CREATININE 0.91 0.98   Liver Panel  Recent Labs  08/23/14 2352 08/25/14 0500  PROT 7.6 7.3  ALBUMIN 3.2* 3.1*  AST 31 27  ALT 26 25  ALKPHOS 100 92  BILITOT 0.5 0.2*  BILIDIR <0.1  --   IBILI NOT CALCULATED  --    Sedimentation Rate  Recent Labs  08/23/14 2352  ESRSEDRATE 67*   C-Reactive Protein  Recent Labs  08/23/14 2352  CRP 1.2*    Microbiology: Recent Results (from the past 240 hour(s))  Culture, blood (routine x 2)  Status: None (Preliminary result)   Collection Time: 08/23/14 10:41 PM  Result Value Ref Range Status   Specimen Description BLOOD R HAND  Final   Special Requests BOTTLES DRAWN AEROBIC AND ANAEROBIC 5ML  Final   Culture   Final           BLOOD CULTURE RECEIVED NO GROWTH TO DATE CULTURE WILL BE HELD FOR 5 DAYS BEFORE ISSUING A FINAL NEGATIVE REPORT Performed at [facility]    Report Status PENDING  Incomplete  Culture, blood (routine x 2)     Status: None (Preliminary result)   Collection Time: 08/23/14 10:41 PM  Result Value Ref Range Status   Specimen Description BLOOD LEFT ANTECUBITAL  Final   Special Requests BOTTLES DRAWN AEROBIC AND ANAEROBIC 3ML  Final   Culture   Final           BLOOD CULTURE RECEIVED NO GROWTH TO DATE CULTURE WILL BE HELD FOR 5 DAYS BEFORE ISSUING A FINAL NEGATIVE REPORT Performed at [facility]    Report Status PENDING  Incomplete  Respiratory virus panel (routine influenza)     Status: None   Collection Time: 08/24/14  1:10 AM  Result Value Ref Range Status   Source - RVPAN NOSE  Corrected   Respiratory Syncytial Virus A Negative Negative Final   Respiratory Syncytial Virus B Negative Negative Final   Influenza A Negative Negative Final   Influenza B Negative Negative Final   Parainfluenza 1 Negative Negative Final   Parainfluenza 2 Negative Negative Final   Parainfluenza 3 Negative Negative Final   Metapneumovirus Negative Negative Final   Rhinovirus Negative Negative Final   Adenovirus Negative Negative Final    Comment: (NOTE) Performed At: [facility] [address] [place], [place] [id number] Lindon Romp MD BZ:[id number]   Culture, expectorated sputum-assessment     Status: None   Collection Time: 08/24/14  8:44 AM  Result Value Ref Range Status   Specimen Description SPUTUM  Final   Special Requests NONE  Final   Sputum evaluation   Final    THIS SPECIMEN IS ACCEPTABLE. RESPIRATORY CULTURE REPORT TO FOLLOW.   Report Status 08/24/2014 FINAL  Final  Culture, respiratory (NON-Expectorated)     Status: None   Collection Time: 08/24/14  8:44 AM  Result Value Ref Range Status   Specimen Description SPUTUM  Final   Special Requests NONE  Final   Gram Stain   Final    FEW WBC PRESENT, PREDOMINANTLY PMN FEW SQUAMOUS EPITHELIAL CELLS PRESENT FEW GRAM POSITIVE COCCI IN PAIRS IN CHAINS IN CLUSTERS FEW GRAM POSITIVE RODS FEW GRAM NEGATIVE RODS    Culture   Final    NORMAL OROPHARYNGEAL FLORA Note: MICROSCOPIC FINDINGS SUGGEST THAT THIS SPECIMEN IS NOT REPRESENTATIVE OF LOWER RESPIRATORY SECRETIONS. PLEASE RECOLLECT. Performed at [facility]    Report Status 08/26/2014 FINAL  Final  Pneumocystis smear by DFA     Status: None   Collection Time: 08/25/14  8:40 AM  Result Value Ref Range Status   Specimen Source-PJSRC SPUTUM  Final    Pneumocystis jiroveci Ag NEGATIVE  Final    Comment: Performed at [facility]  AFB culture with smear     Status: None (Preliminary result)   Collection Time: 08/25/14 11:10 AM  Result Value Ref Range Status   Specimen Description BRONCHIAL ALVEOLAR LAVAGE  Final   Special Requests Immunocompromised  Final   Acid Fast Smear   Final    NO  ACID FAST BACILLI SEEN Performed at [facility]    Culture   Final    CULTURE WILL BE EXAMINED FOR 6 WEEKS BEFORE ISSUING A FINAL REPORT Performed at [facility]    Report Status PENDING  Incomplete  Culture, bal-quantitative     Status: None (Preliminary result)   Collection Time: 08/25/14 11:10 AM  Result Value Ref Range Status   Specimen Description BRONCHIAL ALVEOLAR LAVAGE  Final   Special Requests Immunocompromised  Final   Gram Stain   Final    RARE WBC PRESENT, PREDOMINANTLY PMN RARE SQUAMOUS EPITHELIAL CELLS PRESENT NO ORGANISMS SEEN Performed at [place]   Final    >=100,000 COLONIES/ML Performed at [facility]    Culture   Final    Non-Pathogenic Oropharyngeal-type Flora Isolated. Performed at [facility]    Report Status PENDING  Incomplete  Fungus Culture with Smear     Status: None (Preliminary result)   Collection Time: 08/25/14 11:10 AM  Result Value Ref Range Status   Specimen Description BRONCHIAL ALVEOLAR LAVAGE  Final   Special Requests Immunocompromised  Final   Fungal Smear   Final    NO YEAST OR FUNGAL ELEMENTS SEEN Performed at [facility]    Culture   Final    CULTURE IN PROGRESS FOR FOUR WEEKS Performed at [facility]    Report Status PENDING  Incomplete  Pneumocystis smear by DFA     Status: None   Collection Time: 08/25/14 11:10 AM  Result Value Ref Range Status   Specimen Source-PJSRC BRONCHIAL ALVEOLAR LAVAGE  Final   Pneumocystis jiroveci Ag POSITIVE  Final    Comment: Performed at [place]      Studies/Results: No results found.   Assessment/Plan: AIDS PCP  Will change bactrim to PO Continue prednisone Agree with stopping levaquin Await his viral studies.  Pending- gc/chalmydia, HIV RNA, genotype, HLA [id number]  Will check Hep B S Ab Consider starting Hep A vaccine Start azithromycin for MAI prophylaxis  Total days of antibiotics: 5 levaquin (d/c), bactrim/prednisone         Bobby Rumpf Infectious Diseases (pager) [phone] [email or web address] 08/26/2014, 4:12 PM  LOS: 2 days

## 2014-08-27 LAB — CULTURE, BAL-QUANTITATIVE W GRAM STAIN: Colony Count: >=100000

## 2014-08-27 LAB — CULTURE, BAL-QUANTITATIVE

## 2014-08-27 LAB — HIV-1 RNA QUANT-NO REFLEX-BLD: HIV 1 RNA Quant: UNDETERMINED copies/mL

## 2014-08-27 MED ORDER — ELVITEG-COBIC-EMTRICIT-TENOFAF 150-150-200-10 MG PO TABS
1.0000 | ORAL_TABLET | Freq: Every day | ORAL | Status: DC
  Administered 2014-08-28: 1 via ORAL
  Filled 2014-08-27 (×2): qty 1

## 2014-08-27 NOTE — Progress Notes (Addendum)
Patient ID: Casey Hurley, male   DOB: 05/08/1971, 44 y.o.   MRN: [id number] [facility] PROGRESS NOTE  Casey SeminoleBenjamin E Karman WUJ:[id number] DOB: 05/08/1971 DOA: 08/23/2014 PCP: Astrid DivineGRIFFIN,ELAINE COLLINS, MD  Brief narrative:    44 y.o. male who [facility] with progressive dyspnea on exertion, night sweats, low-grade fevers for last 10 days prior to this admission. Patient reported having similar symptoms back in December 2015 and was previously treated with antibiotics in December. Patient reported that his shortness of breath has worsened since then and now he apparently was not able to ambulate even short distances without becoming short of breath. He was on Levaquin prior to the admission.  On admission, patient was found to have positive HIV with CD4 count of 20. Patient underwent bronchoscopy with washings. The chest x-ray on the admission showed increasing bilateral lower lobe airspace opacities concerning for pneumonia. Bronchial alveolar lavage was positive for PCP. Patient is on treatment for PCP per ID recommendations.  Assessment/Plan:     Principal problem: Acute respiratory failure with hypoxia / PCP pneumonia - Hypoxia likely secondary to PCP pneumonia. Patient underwent bronchoscopy with lavage. Bronchial alveolar lavage was positive for PCP. Patient is on treatment for PCP. - Respiratory status is stable. - Of note, patient was on vancomycin and cefepime from the time of the admission but this was subsequently changed to Levaquin. Levaquin was stopped per pulmonary 08/26/2014. - No yeast / fungus is identified on BAL. Blood cultures to date are negative. BAL culture showed non-pathogenic oropharyngeal flora. No acid-fast bacilli seen on BAL. - Respiratory virus panel is negative. Legionella is negative. - Respiratory culture showed a normal oropharyngeal flora.  Active problems: HIV/AIDS - HIV test positive. CD4 count 20. Viral load is still pending.  Genotype is pending. - Infectious disease following, appreciate their recommendations. - Patient has received tuberculin injection. - Hepatitis panel negative. RPR nonreactive.  Essential hypertension - Blood pressure stable.  Sinus tachycardia - Likely secondary to pneumonia - Heart rate 104. Continue to monitor for next 24 hours.   DVT Prophylaxis  - Lovenox subcutaneous ordered while patient in hospital   Code Status: Full.  Family Communication:  plan of care discussed with the patient Disposition Plan: Still tachycardic with ambulation. Will monitor for next 24 hours. Hopefully discharge tomorrow.  IV access:  Peripheral IV  Procedures and diagnostic studies:    Dg Chest 2 View 08/23/2014   Increasing bilateral lower lobe airspace opacities concerning for pneumonia.     Ct Angio Chest Pe W/cm &/or Wo Cm 08/23/2014   1. Diffuse airspace disease, progressed over the past 10 days. The pattern suggests atypical/opportunistic infection or inflammatory pneumonia (especially hypersensitivity pneumonitis). 2. No evidence of pulmonary embolism.     Bronchoscopy: Dr. Delton CoombesByrum 08/25/2014  Medical Consultants:  Pulmonary: Dr. Delton CoombesByrum 08/24/2014 ID: Dr. Drue SecondSnider 08/24/2014  Other Consultants:   IAnti-Infectives:   Vancomycin 08/23/2014--> 08/25/2014 Cefepime 08/23/2014 --> 08/25/2014 Bactrim 08/24/2014 --> Levaquin 08/25/2014 --> 08/26/2014  Manson PasseyEVINE, Chevella Pearce, MD  [facility] Pager [phone]  If 7PM-7AM, please contact night-coverage [email or web address] Password [email or web address] 08/27/2014, 10:45 AM   LOS: 3 days    HPI/Subjective: No acute overnight events.  Objective: Filed Vitals:   08/26/14 1253 08/26/14 1541 08/26/14 2108 08/27/14 0526  BP: 112/78 126/81 125/78 107/63  Pulse: 101 104 103 82  Temp: 98.5 F (36.9 C) 98.7 F (37.1 C) 98.1 F (36.7 C) 98 F (36.7 C)  TempSrc: Oral Oral Oral Oral  Resp: 20 20 18  24  Height:   (1.753 m)    Weight:  61.1 kg (134 lb 11.2 oz)  60.7  kg (133 lb 13.1 oz)  SpO2: 97% 97% 98% 100%    Intake/Output Summary (Last 24 hours) at 08/27/14 1045 Last data filed at 08/26/14 1700  Gross per 24 hour  Intake    240 ml  Output      2 ml  Net    238 ml    Exam:   General:  Pt is alert, awake, no distress  Cardiovascular: Regular rate and rhythm, S1/S2 appreciated  Respiratory: No wheezing or rhonchi  Abdomen: Nontender, nondistended abdomen, present bowel sounds  Extremities: No leg swelling, palpable pulses  Neuro: Nonfocal  Data Reviewed: Basic Metabolic Panel:  Recent Labs Lab 08/23/14 2014 08/25/14 0500 08/26/14 0555  NA 137 139 138  K 3.3* 3.8 3.9  CL 102 106 107  CO2 GLUCOSE 112* 145* 122*  BUN CREATININE 1.08 0.91 0.98  CALCIUM 9.1 9.1 9.0   Liver Function Tests:  Recent Labs Lab 08/23/14 2352 08/25/14 0500  AST 31 27  ALT 26 25  ALKPHOS 100 92  BILITOT 0.5 0.2*  PROT 7.6 7.3  ALBUMIN 3.2* 3.1*   No results for input(s): LIPASE, AMYLASE in the last 168 hours. No results for input(s): AMMONIA in the last 168 hours. CBC:  Recent Labs Lab 08/23/14 2014 08/23/14 2352 08/25/14 0500 08/26/14 0555  WBC 6.1 7.1 5.5 9.6  NEUTROABS  --  4.7 4.2  --   HGB 15.2 14.0 13.3 12.9*  HCT 42.5 40.2 37.4* 37.4*  MCV 89.7 91.0 89.9 90.3  PLT 243 234 238 264   Cardiac Enzymes: No results for input(s): CKTOTAL, CKMB, CKMBINDEX, TROPONINI in the last 168 hours. BNP: Invalid input(s): POCBNP CBG: No results for input(s): GLUCAP in the last 168 hours.  Recent Results (from the past 240 hour(s))  Culture, blood (routine x 2)     Status: None (Preliminary result)   Collection Time: 08/23/14 10:41 PM  Result Value Ref Range Status   Specimen Description BLOOD R HAND  Final   Special Requests BOTTLES DRAWN AEROBIC AND ANAEROBIC  Final   Culture   Final           BLOOD CULTURE RECEIVED NO GROWTH TO DATE CULTURE WILL BE HELD FOR 5 DAYS BEFORE ISSUING A FINAL NEGATIVE  REPORT Performed at [facility]    Report Status PENDING  Incomplete  Culture, blood (routine x 2)     Status: None (Preliminary result)   Collection Time: 08/23/14 10:41 PM  Result Value Ref Range Status   Specimen Description BLOOD LEFT ANTECUBITAL  Final   Special Requests BOTTLES DRAWN AEROBIC AND ANAEROBIC  Final   Culture   Final           BLOOD CULTURE RECEIVED NO GROWTH TO DATE CULTURE WILL BE HELD FOR 5 DAYS BEFORE ISSUING A FINAL NEGATIVE REPORT Performed at [facility]    Report Status PENDING  Incomplete  Respiratory virus panel (routine influenza)     Status: None   Collection Time: 08/24/14  1:10 AM  Result Value Ref Range Status   Source - RVPAN NOSE  Corrected   Respiratory Syncytial Virus A Negative Negative Final   Respiratory Syncytial Virus B Negative Negative Final   Influenza A Negative Negative Final   Influenza B Negative Negative Final   Parainfluenza 1 Negative Negative  Final   Parainfluenza 2 Negative Negative Final   Parainfluenza 3 Negative Negative Final   Metapneumovirus Negative Negative Final   Rhinovirus Negative Negative Final   Adenovirus Negative Negative Final    Comment: (NOTE) Performed At: [facility] [address] [place], [place] [id number] Mila Homer MD WU:[id number]   Culture, expectorated sputum-assessment     Status: None   Collection Time: 08/24/14  8:44 AM  Result Value Ref Range Status   Specimen Description SPUTUM  Final   Special Requests NONE  Final   Sputum evaluation   Final    THIS SPECIMEN IS ACCEPTABLE. RESPIRATORY CULTURE REPORT TO FOLLOW.   Report Status 08/24/2014 FINAL  Final  Culture, respiratory (NON-Expectorated)     Status: None   Collection Time: 08/24/14  8:44 AM  Result Value Ref Range Status   Specimen Description SPUTUM  Final   Special Requests NONE  Final   Gram Stain   Final    FEW WBC PRESENT, PREDOMINANTLY PMN FEW SQUAMOUS EPITHELIAL CELLS PRESENT FEW GRAM  POSITIVE COCCI IN PAIRS IN CHAINS IN CLUSTERS FEW GRAM POSITIVE RODS FEW GRAM NEGATIVE RODS    Culture   Final    NORMAL OROPHARYNGEAL FLORA Note: MICROSCOPIC FINDINGS SUGGEST THAT THIS SPECIMEN IS NOT REPRESENTATIVE OF LOWER RESPIRATORY SECRETIONS. PLEASE RECOLLECT. Performed at [facility]    Report Status 08/26/2014 FINAL  Final  Pneumocystis smear by DFA     Status: None   Collection Time: 08/25/14  8:40 AM  Result Value Ref Range Status   Specimen Source-PJSRC SPUTUM  Final   Pneumocystis jiroveci Ag NEGATIVE  Final    Comment: Performed at [facility]  AFB culture with smear     Status: None (Preliminary result)   Collection Time: 08/25/14 11:10 AM  Result Value Ref Range Status   Specimen Description BRONCHIAL ALVEOLAR LAVAGE  Final   Special Requests Immunocompromised  Final   Acid Fast Smear   Final    NO ACID FAST BACILLI SEEN Performed at [facility]    Culture   Final    CULTURE WILL BE EXAMINED FOR 6 WEEKS BEFORE ISSUING A FINAL REPORT Performed at [facility]    Report Status PENDING  Incomplete  Culture, bal-quantitative     Status: None   Collection Time: 08/25/14 11:10 AM  Result Value Ref Range Status   Specimen Description BRONCHIAL ALVEOLAR LAVAGE  Final   Special Requests Immunocompromised  Final   Gram Stain   Final    RARE WBC PRESENT, PREDOMINANTLY PMN RARE SQUAMOUS EPITHELIAL CELLS PRESENT NO ORGANISMS SEEN Performed at [facility]   Final    >=100,000 COLONIES/ML Performed at [facility]    Culture   Final    Non-Pathogenic Oropharyngeal-type Flora Isolated. Performed at [facility]    Report Status 08/27/2014 FINAL  Final  Fungus Culture with Smear     Status: None (Preliminary result)   Collection Time: 08/25/14 11:10 AM  Result Value Ref Range Status   Specimen Description BRONCHIAL ALVEOLAR LAVAGE  Final   Special Requests Immunocompromised  Final   Fungal  Smear   Final    NO YEAST OR FUNGAL ELEMENTS SEEN Performed at [facility]    Culture   Final    CULTURE IN PROGRESS FOR FOUR WEEKS Performed at [facility]    Report Status PENDING  Incomplete  Pneumocystis smear by DFA  Status: None   Collection Time: 08/25/14 11:10 AM  Result Value Ref Range Status   Specimen Source-PJSRC BRONCHIAL ALVEOLAR LAVAGE  Final   Pneumocystis jiroveci Ag POSITIVE  Final    Comment: Performed at [facility]  Virus culture     Status: None   Collection Time: 08/25/14 11:10 AM  Result Value Ref Range Status   Viral Culture Comment  Final    Comment: (NOTE) Preliminary Report: No virus isolated at 24 hours. Next report to follow after 4 days. Performed At: [facility] [address] [place], [place] [id number] Mila Homer MD EX:[id number]    Source of Sample BRONCHIAL ALVEOLAR LAVAGE  Final     Scheduled Meds: . azithromycin  1,200 mg Oral Weekly  . enoxaparin (LOVENOX) injection  40 mg Subcutaneous QHS  . predniSONE  40 mg Oral BID WC  . sulfamethoxazole-trimethoprim  2 tablet Oral Q8H  . valACYclovir  500 mg Oral Daily   Continuous Infusions: . sodium chloride 10 mL/hr at 08/26/14 1207

## 2014-08-27 NOTE — Progress Notes (Signed)
INFECTIOUS DISEASE PROGRESS NOTE  ID: Casey Hurley is a 44 y.o. male with  Principal Problem:   Recurrent pneumonia Active Problems:   CAP (community acquired pneumonia)   Essential hypertension   Genital herpes   AIDS   Tachycardia   Acute on chronic respiratory failure with hypoxia   PCP (pneumocystis carinii pneumonia)  Subjective: Without complaints.  Still has mild DOE, tachy cardia with exertion. Feels this is better  Abtx:  Anti-infectives    Start     Dose/Rate Route Frequency Ordered Stop   08/26/14 1800  azithromycin (ZITHROMAX) tablet 1,200 mg     1,200 mg Oral Weekly 08/26/14 1617     08/26/14 1800  sulfamethoxazole-trimethoprim (BACTRIM DS,SEPTRA DS) 800-160 MG per tablet 2 tablet     2 tablet Oral Every 8 hours 08/26/14 1617     08/24/14 1300  levofloxacin (LEVAQUIN) IVPB 750 mg     750 mg 100 mL/hr over 90 Minutes Intravenous Every 24 hours 08/24/14 1156 08/25/14 1340   08/24/14 1000  valACYclovir (VALTREX) tablet 500 mg     500 mg Oral Daily 08/24/14 0026     08/24/14 1000  vancomycin (VANCOCIN) IVPB 750 mg/150 ml premix  Status:  Discontinued     750 mg 150 mL/hr over 60 Minutes Intravenous Every 8 hours 08/24/14 0050 08/24/14 1156   08/24/14 1000  sulfamethoxazole-trimethoprim (BACTRIM) 320 mg in dextrose 5 % 500 mL IVPB  Status:  Discontinued     320 mg 346.7 mL/hr over 90 Minutes Intravenous Every 8 hours 08/24/14 0900 08/26/14 1617   08/24/14 0800  ceFEPIme (MAXIPIME) 1 g in dextrose 5 % 50 mL IVPB  Status:  Discontinued     1 g 100 mL/hr over 30 Minutes Intravenous Every 8 hours 08/24/14 0026 08/24/14 1156   08/23/14 2315  ceFEPIme (MAXIPIME) 2 g in dextrose 5 % 50 mL IVPB     2 g 100 mL/hr over 30 Minutes Intravenous  Once 08/23/14 2307 08/24/14 0016   08/23/14 2315  vancomycin (VANCOCIN) IVPB 1000 mg/200 mL premix     1,000 mg 200 mL/hr over 60 Minutes Intravenous  Once 08/23/14 2307 08/24/14 0217      Medications:  Scheduled: .  azithromycin  1,200 mg Oral Weekly  . enoxaparin (LOVENOX) injection  40 mg Subcutaneous QHS  . predniSONE  40 mg Oral BID WC  . sulfamethoxazole-trimethoprim  2 tablet Oral Q8H  . valACYclovir  500 mg Oral Daily    Objective: Vital signs in last 24 hours: Temp:  [98 F (36.7 C)-98.7 F (37.1 C)] 98 F (36.7 C) (03/03 0526) Pulse Rate:  [82-104] 82 (03/03 0526) Resp:  [18-24] 24 (03/03 0526) BP: (107-126)/(63-81) 107/63 mmHg (03/03 0526) SpO2:  [97 %-100 %] 100 % (03/03 0526) Weight:  [60.7 kg (133 lb 13.1 oz)-61.1 kg (134 lb 11.2 oz)] 60.7 kg (133 lb 13.1 oz) (03/03 0526)   General appearance: alert, cooperative and no distress Resp: clear to auscultation bilaterally Cardio: regular rate and rhythm GI: normal findings: bowel sounds normal and soft, non-tender  Lab Results  Recent Labs  08/25/14 0500 08/26/14 0555  WBC 5.5 9.6  HGB 13.3 12.9*  HCT 37.4* 37.4*  NA 139 138  K 3.8 3.9  CL 106 107  CO2 24 26  BUN 8 9  CREATININE 0.91 0.98   Liver Panel  Recent Labs  08/25/14 0500  PROT 7.3  ALBUMIN 3.1*  AST 27  ALT 25  ALKPHOS 92  BILITOT  0.2*   Sedimentation Rate No results for input(s): ESRSEDRATE in the last 72 hours. C-Reactive Protein No results for input(s): CRP in the last 72 hours.  Microbiology: Recent Results (from the past 240 hour(s))  Culture, blood (routine x 2)     Status: None (Preliminary result)   Collection Time: 08/23/14 10:41 PM  Result Value Ref Range Status   Specimen Description BLOOD R HAND  Final   Special Requests BOTTLES DRAWN AEROBIC AND ANAEROBIC 5ML  Final   Culture   Final           BLOOD CULTURE RECEIVED NO GROWTH TO DATE CULTURE WILL BE HELD FOR 5 DAYS BEFORE ISSUING A FINAL NEGATIVE REPORT Performed at [facility]    Report Status PENDING  Incomplete  Culture, blood (routine x 2)     Status: None (Preliminary result)   Collection Time: 08/23/14 10:41 PM  Result Value Ref Range Status   Specimen  Description BLOOD LEFT ANTECUBITAL  Final   Special Requests BOTTLES DRAWN AEROBIC AND ANAEROBIC 3ML  Final   Culture   Final           BLOOD CULTURE RECEIVED NO GROWTH TO DATE CULTURE WILL BE HELD FOR 5 DAYS BEFORE ISSUING A FINAL NEGATIVE REPORT Performed at [facility]    Report Status PENDING  Incomplete  Respiratory virus panel (routine influenza)     Status: None   Collection Time: 08/24/14  1:10 AM  Result Value Ref Range Status   Source - RVPAN NOSE  Corrected   Respiratory Syncytial Virus A Negative Negative Final   Respiratory Syncytial Virus B Negative Negative Final   Influenza A Negative Negative Final   Influenza B Negative Negative Final   Parainfluenza 1 Negative Negative Final   Parainfluenza 2 Negative Negative Final   Parainfluenza 3 Negative Negative Final   Metapneumovirus Negative Negative Final   Rhinovirus Negative Negative Final   Adenovirus Negative Negative Final    Comment: (NOTE) Performed At: [facility] [address] [place], [place] [id number] Lindon Romp MD EL:[id number]   Culture, expectorated sputum-assessment     Status: None   Collection Time: 08/24/14  8:44 AM  Result Value Ref Range Status   Specimen Description SPUTUM  Final   Special Requests NONE  Final   Sputum evaluation   Final    THIS SPECIMEN IS ACCEPTABLE. RESPIRATORY CULTURE REPORT TO FOLLOW.   Report Status 08/24/2014 FINAL  Final  Culture, respiratory (NON-Expectorated)     Status: None   Collection Time: 08/24/14  8:44 AM  Result Value Ref Range Status   Specimen Description SPUTUM  Final   Special Requests NONE  Final   Gram Stain   Final    FEW WBC PRESENT, PREDOMINANTLY PMN FEW SQUAMOUS EPITHELIAL CELLS PRESENT FEW GRAM POSITIVE COCCI IN PAIRS IN CHAINS IN CLUSTERS FEW GRAM POSITIVE RODS FEW GRAM NEGATIVE RODS    Culture   Final    NORMAL OROPHARYNGEAL FLORA Note: MICROSCOPIC FINDINGS SUGGEST THAT THIS SPECIMEN IS NOT REPRESENTATIVE OF LOWER  RESPIRATORY SECRETIONS. PLEASE RECOLLECT. Performed at [facility]    Report Status 08/26/2014 FINAL  Final  Pneumocystis smear by DFA     Status: None   Collection Time: 08/25/14  8:40 AM  Result Value Ref Range Status   Specimen Source-PJSRC SPUTUM  Final   Pneumocystis jiroveci Ag NEGATIVE  Final    Comment: Performed at [facility]  AFB culture with smear  Status: None (Preliminary result)   Collection Time: 08/25/14 11:10 AM  Result Value Ref Range Status   Specimen Description BRONCHIAL ALVEOLAR LAVAGE  Final   Special Requests Immunocompromised  Final   Acid Fast Smear   Final    NO ACID FAST BACILLI SEEN Performed at [facility]    Culture   Final    CULTURE WILL BE EXAMINED FOR 6 WEEKS BEFORE ISSUING A FINAL REPORT Performed at [facility]    Report Status PENDING  Incomplete  Culture, bal-quantitative     Status: None   Collection Time: 08/25/14 11:10 AM  Result Value Ref Range Status   Specimen Description BRONCHIAL ALVEOLAR LAVAGE  Final   Special Requests Immunocompromised  Final   Gram Stain   Final    RARE WBC PRESENT, PREDOMINANTLY PMN RARE SQUAMOUS EPITHELIAL CELLS PRESENT NO ORGANISMS SEEN Performed at [place]   Final    >=100,000 COLONIES/ML Performed at [facility]    Culture   Final    Non-Pathogenic Oropharyngeal-type Flora Isolated. Performed at [facility]    Report Status 08/27/2014 FINAL  Final  Fungus Culture with Smear     Status: None (Preliminary result)   Collection Time: 08/25/14 11:10 AM  Result Value Ref Range Status   Specimen Description BRONCHIAL ALVEOLAR LAVAGE  Final   Special Requests Immunocompromised  Final   Fungal Smear   Final    NO YEAST OR FUNGAL ELEMENTS SEEN Performed at [facility]    Culture   Final    CULTURE IN PROGRESS FOR FOUR WEEKS Performed at [facility]    Report Status PENDING  Incomplete    Pneumocystis smear by DFA     Status: None   Collection Time: 08/25/14 11:10 AM  Result Value Ref Range Status   Specimen Source-PJSRC BRONCHIAL ALVEOLAR LAVAGE  Final   Pneumocystis jiroveci Ag POSITIVE  Final    Comment: Performed at [facility]  Virus culture     Status: None   Collection Time: 08/25/14 11:10 AM  Result Value Ref Range Status   Viral Culture Comment  Final    Comment: (NOTE) Preliminary Report: No virus isolated at 24 hours. Next report to follow after 4 days. Performed At: [facility] [place], [place] [id number] Lindon Romp MD TJ:[id number]    Source of Sample BRONCHIAL ALVEOLAR LAVAGE  Final    Studies/Results: No results found.   Assessment/Plan: AIDS PCP  Continue prednisone/bactrim, azithro. Appreciate pharm assistance Await his viral studies. will start Genvoya, case management checkling insurance formulary coverage.   Pending- HIV RNA/genotype, HLA B5701, Hep B S Ab  Consider starting Hep A vaccine or twinrix  Total days of antibiotics: 5 levaquin (d/c), Day 4 bactrim/prednisone         Bobby Rumpf Infectious Diseases (pager) [phone] [email or web address] 08/27/2014, 10:38 AM  LOS: 3 days

## 2014-08-27 NOTE — Progress Notes (Signed)
CARE MANAGEMENT NOTE 08/27/2014  Patient:  Helayne SeminoleVANS,Jack E   Account Number:  [id number]  Date Initiated:  08/27/2014  Documentation initiated by:  Ferdinand CavaSCHETTINO,Candido Flott  Subjective/Objective Assessment:   [age] to male admitted with pna from home with spouse     Action/Plan:   discharge planning   Anticipated DC Date:  08/28/2014   Anticipated DC Plan:  HOME/SELF CARE      DC Planning Services  CM consult      Choice offered to / List presented to:             Status of service:  In process, will continue to follow Medicare Important Message given?   (If response is "NO", the following Medicare IM given date fields will be blank) Date Medicare IM given:   Medicare IM given by:   Date Additional Medicare IM given:   Additional Medicare IM given by:    Discharge Disposition:  HOME/SELF CARE  Per UR Regulation:    If discussed at Long Length of Stay Meetings, dates discussed:    Comments:  08/27/14 Ferdinand CavaAndrea Schettino RN BSN CM [phone] Received consult for benefit chaeck for Genvoya. Informed that 1 tab/day for quantity of 30 would cost $72.00. Will continue to follow for further discharge planning needs.

## 2014-08-27 NOTE — Progress Notes (Signed)
ANTIBIOTIC CONSULT NOTE   Pharmacy Consult for Pneumocystis pneumonia Indication: TMP/SMZ  No Known Allergies  Patient Measurements: Height:  (175.3 cm) Weight: 133 lb 13.1 oz (60.7 kg) IBW/kg (Calculated) : 70.7   Vital Signs: Temp: 98 F (36.7 C) (03/03 0526) Temp Source: Oral (03/03 0526) BP: 107/63 mmHg (03/03 0526) Pulse Rate: 82 (03/03 0526) Intake/Output from previous day: 03/02 0701 - 03/03 0700 In: 240 [P.O.:240] Out: 2 [Urine:2] Intake/Output from this shift:    Labs:  Recent Labs  08/25/14 0500 08/26/14 0555  WBC 5.5 9.6  HGB 13.3 12.9*  PLT 238 264  CREATININE 0.91 0.98   Estimated Creatinine Clearance: 85.2 mL/min (by C-G formula based on Cr of 0.98). No results for input(s): VANCOTROUGH, VANCOPEAK, VANCORANDOM, GENTTROUGH, GENTPEAK, GENTRANDOM, TOBRATROUGH, TOBRAPEAK, TOBRARND, AMIKACINPEAK, AMIKACINTROU, AMIKACIN in the last 72 hours.   Microbiology: Recent Results (from the past 720 hour(s))  Culture, blood (routine x 2)     Status: None (Preliminary result)   Collection Time: 08/23/14 10:41 PM  Result Value Ref Range Status   Specimen Description BLOOD R HAND  Final   Special Requests BOTTLES DRAWN AEROBIC AND ANAEROBIC  Final   Culture   Final           BLOOD CULTURE RECEIVED NO GROWTH TO DATE CULTURE WILL BE HELD FOR 5 DAYS BEFORE ISSUING A FINAL NEGATIVE REPORT Performed at [facility]    Report Status PENDING  Incomplete  Culture, blood (routine x 2)     Status: None (Preliminary result)   Collection Time: 08/23/14 10:41 PM  Result Value Ref Range Status   Specimen Description BLOOD LEFT ANTECUBITAL  Final   Special Requests BOTTLES DRAWN AEROBIC AND ANAEROBIC  Final   Culture   Final           BLOOD CULTURE RECEIVED NO GROWTH TO DATE CULTURE WILL BE HELD FOR 5 DAYS BEFORE ISSUING A FINAL NEGATIVE REPORT Performed at [facility]    Report Status PENDING  Incomplete  Respiratory virus panel (routine  influenza)     Status: None   Collection Time: 08/24/14  1:10 AM  Result Value Ref Range Status   Source - RVPAN NOSE  Corrected   Respiratory Syncytial Virus A Negative Negative Final   Respiratory Syncytial Virus B Negative Negative Final   Influenza A Negative Negative Final   Influenza B Negative Negative Final   Parainfluenza 1 Negative Negative Final   Parainfluenza 2 Negative Negative Final   Parainfluenza 3 Negative Negative Final   Metapneumovirus Negative Negative Final   Rhinovirus Negative Negative Final   Adenovirus Negative Negative Final    Comment: (NOTE) Performed At: [facility] [address] [place], [place] [id number] Mila Homer MD WU:[id number]   Culture, expectorated sputum-assessment     Status: None   Collection Time: 08/24/14  8:44 AM  Result Value Ref Range Status   Specimen Description SPUTUM  Final   Special Requests NONE  Final   Sputum evaluation   Final    THIS SPECIMEN IS ACCEPTABLE. RESPIRATORY CULTURE REPORT TO FOLLOW.   Report Status 08/24/2014 FINAL  Final  Culture, respiratory (NON-Expectorated)     Status: None   Collection Time: 08/24/14  8:44 AM  Result Value Ref Range Status   Specimen Description SPUTUM  Final   Special Requests NONE  Final   Gram Stain   Final    FEW WBC PRESENT, PREDOMINANTLY PMN FEW SQUAMOUS EPITHELIAL CELLS PRESENT FEW  GRAM POSITIVE COCCI IN PAIRS IN CHAINS IN CLUSTERS FEW GRAM POSITIVE RODS FEW GRAM NEGATIVE RODS    Culture   Final    NORMAL OROPHARYNGEAL FLORA Note: MICROSCOPIC FINDINGS SUGGEST THAT THIS SPECIMEN IS NOT REPRESENTATIVE OF LOWER RESPIRATORY SECRETIONS. PLEASE RECOLLECT. Performed at [facility]    Report Status 08/26/2014 FINAL  Final  Pneumocystis smear by DFA     Status: None   Collection Time: 08/25/14  8:40 AM  Result Value Ref Range Status   Specimen Source-PJSRC SPUTUM  Final   Pneumocystis jiroveci Ag NEGATIVE  Final    Comment: Performed at [facility]  AFB culture with smear     Status: None (Preliminary result)   Collection Time: 08/25/14 11:10 AM  Result Value Ref Range Status   Specimen Description BRONCHIAL ALVEOLAR LAVAGE  Final   Special Requests Immunocompromised  Final   Acid Fast Smear   Final    NO ACID FAST BACILLI SEEN Performed at [facility]    Culture   Final    CULTURE WILL BE EXAMINED FOR 6 WEEKS BEFORE ISSUING A FINAL REPORT Performed at [facility]    Report Status PENDING  Incomplete  Culture, bal-quantitative     Status: None (Preliminary result)   Collection Time: 08/25/14 11:10 AM  Result Value Ref Range Status   Specimen Description BRONCHIAL ALVEOLAR LAVAGE  Final   Special Requests Immunocompromised  Final   Gram Stain   Final    RARE WBC PRESENT, PREDOMINANTLY PMN RARE SQUAMOUS EPITHELIAL CELLS PRESENT NO ORGANISMS SEEN Performed at [facility]   Final    >=100,000 COLONIES/ML Performed at [facility]    Culture   Final    Non-Pathogenic Oropharyngeal-type Flora Isolated. Performed at [facility]    Report Status PENDING  Incomplete  Fungus Culture with Smear     Status: None (Preliminary result)   Collection Time: 08/25/14 11:10 AM  Result Value Ref Range Status   Specimen Description BRONCHIAL ALVEOLAR LAVAGE  Final   Special Requests Immunocompromised  Final   Fungal Smear   Final    NO YEAST OR FUNGAL ELEMENTS SEEN Performed at [facility]    Culture   Final    CULTURE IN PROGRESS FOR FOUR WEEKS Performed at [facility]    Report Status PENDING  Incomplete  Pneumocystis smear by DFA     Status: None   Collection Time: 08/25/14 11:10 AM  Result Value Ref Range Status   Specimen Source-PJSRC BRONCHIAL ALVEOLAR LAVAGE  Final   Pneumocystis jiroveci Ag POSITIVE  Final    Comment: Performed at [facility]  Virus culture     Status: None   Collection Time: 08/25/14 11:10 AM   Result Value Ref Range Status   Viral Culture Comment  Final    Comment: (NOTE) Preliminary Report: No virus isolated at 24 hours. Next report to follow after 4 days. Performed At: [facility] [address] [place], [place] [id number] Mila Homer MD WU:[id number]    Source of Sample BRONCHIAL ALVEOLAR LAVAGE  Final    Medical History: Past Medical History  Diagnosis Date  . Genital herpes   . Hypertension     Medications:  Scheduled:  . azithromycin  1,200 mg Oral Weekly  . enoxaparin (LOVENOX) injection  40 mg Subcutaneous QHS  . predniSONE  40 mg Oral BID WC  . sulfamethoxazole-trimethoprim  2 tablet Oral Q8H  . valACYclovir  500 mg Oral Daily   Infusions:  . sodium chloride 10 mL/hr at 08/26/14 1207   Assessment: [age] yoM with a hx of HTN, genital herpes presents to the ED complaining of gradual, persistent, progressively worsening dyspnea on exertion, cough, chest pain onset several weeks ago, worsening in the last several days. Pt reports dx with community PNA in 12/15. He reports symptoms were better after multiple abx and ultimately a course of doxycycline. Beginning of 08/03/14 he began to feel poorly (fever to 102, cough, chest pain, chills) and was diagnosed with PNA again on 08/13/14. Pt reports he finished a course of Augmentin without relief. Pt reports he saw his PCP 3 days ago after persistent SOB and fevers. He was given albuterol and abx was changed to levaquin. Patient reports he now has SOB and dyspnea on exertion including simply walking from room to room in his house. Pt is a smoker 1ppd until Dec. Cefepime and Vancomycin per Rx to TMP/SMZ for pneumocystis pneumonia  2/28 >>cefepime >> 2/29 2/28 >>vancomycin >> 2/29 2/29 >> Bactrim >> 2/29 >> Levaquin >> 3/1 (on Levaquin PTA for CAP) PTA>> Valtrex 500mg  daily >>  Tmax: afebrile WBCs: WNL Renal: Scr 0.98, stable, CrCl 86  2/28 blood x2: NGtd 2/29 legionella Ag: negative 2/29  strep Ag: negative 3/1 BAL: no yeast or fungal elements seen; cx will be in progress for 4 wks 3/1 BAL: Non-pathogenic oral flora 3/1 Pneumocystis smear: POSITIVE 2/29:Resp virus panel: negative 3/1:Sputum for AFB: collected  Goal of Therapy:  Dose per weight, indication, and renal function  Plan:  Day #4 TMP/SMZ  Continue TMP/SMZ 2 DS tabs TID (~15mg /kg/day).  Changed to PO 3/2  No dose adjustments needed at this time  Juliette Alcideustin Zeigler, PharmD, BCPS.   Pager: [phone]  08/27/2014,9:01 AM

## 2014-08-28 DIAGNOSIS — A630 Anogenital (venereal) warts: Secondary | ICD-10-CM

## 2014-08-28 DIAGNOSIS — N529 Male erectile dysfunction, unspecified: Secondary | ICD-10-CM

## 2014-08-28 DIAGNOSIS — B009 Herpesviral infection, unspecified: Secondary | ICD-10-CM

## 2014-08-28 DIAGNOSIS — G4700 Insomnia, unspecified: Secondary | ICD-10-CM

## 2014-08-28 LAB — HEPATITIS B SURFACE ANTIBODY, QUANTITATIVE: Hepatitis B-Post: <3.1 m[IU]/mL — ABNORMAL LOW (ref >9.9)

## 2014-08-28 LAB — HLA B*5701: HLA B 5701: NEGATIVE

## 2014-08-28 MED ORDER — HEPATITIS A VACCINE 1440 EL U/ML IM SUSP
1.0000 mL | Freq: Once | INTRAMUSCULAR | Status: AC
  Administered 2014-08-28: 1440 [IU] via INTRAMUSCULAR
  Filled 2014-08-28: qty 1

## 2014-08-28 MED ORDER — ELVITEG-COBIC-EMTRICIT-TENOFAF 150-150-200-10 MG PO TABS: 1.0000 | ORAL_TABLET | Freq: Every day | ORAL | Status: DC

## 2014-08-28 MED ORDER — SULFAMETHOXAZOLE-TRIMETHOPRIM 800-160 MG PO TABS: 2.0000 | ORAL_TABLET | Freq: Three times a day (TID) | ORAL | Status: DC

## 2014-08-28 MED ORDER — AZITHROMYCIN 600 MG PO TABS: 1200.0000 mg | ORAL_TABLET | ORAL | Status: DC

## 2014-08-28 MED ORDER — LORAZEPAM 0.5 MG PO TABS: 0.5000 mg | ORAL_TABLET | Freq: Two times a day (BID) | ORAL | Status: DC | PRN

## 2014-08-28 MED ORDER — SULFAMETHOXAZOLE-TRIMETHOPRIM 800-160 MG PO TABS: 1.0000 | ORAL_TABLET | Freq: Every day | ORAL | Status: DC

## 2014-08-28 MED ORDER — PREDNISONE 20 MG PO TABS: 40.0000 mg | ORAL_TABLET | Freq: Two times a day (BID) | ORAL | Status: DC

## 2014-08-28 MED ORDER — HEPATITIS B VAC RECOMBINANT 10 MCG/ML IJ SUSP
1.0000 mL | Freq: Once | INTRAMUSCULAR | Status: AC
  Administered 2014-08-28: 10 ug via INTRAMUSCULAR
  Filled 2014-08-28: qty 1

## 2014-08-28 NOTE — Progress Notes (Signed)
Pt left at this time with his spouse. Alert, oriented, and without c/o. Discharge instructions/prescriptions given/explained with pt verbalizing understanding. Followup appointments noted.

## 2014-08-28 NOTE — Discharge Instructions (Signed)
Pneumocystis Jiroveci Pneumonia °Pneumocystis jiroveci pneumonia is a fungal infection that affects the lungs. The infection occurs in premature infants and people who have a weakened immune system. A person may have a weakened immune system due to:  °· Certain cancers or cancer treatment. °· HIV infection or AIDS. °· Organ or bone marrow transplantation. °· Treatment with corticosteroids or other medicines that cause immune suppression. °CAUSES  °This infection is caused by the fungal organism Pneumocystis jiroveci (once called Pneumocystis carinii). This organism is common in the environment. It is present in, or breathed into, the lungs of most people on a regular basis. It does not cause disease in people with healthy immune systems. However, when this fungus enters the lungs of a person with a weakened immune system, it can cause pneumocystis jiroveci pneumonia. °SYMPTOMS  °The most common symptoms are:  °· Fever. °· Mild or dry cough. °· Shortness of breath, especially with any physical activity (exertion). °· Rapid breathing. °In people with this pneumonia and AIDS, symptoms may develop slowly over several weeks. Other people with this pneumonia and a weakened immune system usually get symptoms more suddenly.  °DIAGNOSIS  °To diagnose this infection, a caregiver must examine lung secretions under a microscope to see if the organism is present. The secretions are often obtained using a flexible tube (bronchoscope) that is inserted into the lungs by going through the mouth or nose. On occasion, the organism can be identified in coughed-up saliva mixed with mucus (sputum). Other tests may include:  °· A chest X-ray. °· Blood tests. °· Taking a tissue sample of the lungs (biopsy). °TREATMENT  °Treatment with antibiotic medicine (and sometimes steroid medicines) is usually successful. This infection must be treated for several weeks. Because the infection is very serious, your caregiver may have you begin  treatment before test results are available. If you have a weakened immune system and you are at high risk for this infection, your caregiver may prescribe an antibiotic to prevent the infection. This treatment can prevent both relapses and new episodes of the infection. °HOME CARE INSTRUCTIONS  °· Only take over-the-counter or prescription medicines as directed by your caregiver. °· Take your antibiotics as directed. Finish them even if you start to feel better. °· Avoid smoking because it can contribute to pneumonia. °· Schedule and keep all follow-up visits. °SEEK IMMEDIATE MEDICAL CARE IF:  °· Any of your symptoms are getting worse rather than better. °· You have a fever or persistent symptoms for more than 2-3 days. °· You have a fever and your symptoms suddenly get worse. °· You have nausea, vomiting, diarrhea, or a skin rash. °MAKE SURE YOU:  °· Understand these instructions. °· Will watch your condition. °· Will get help right away if you are not doing well or get worse. °Document Released: 09/02/2002 Document Revised: 03/06/2012 Document Reviewed: 01/09/2012 °ExitCare® Patient Information ©2015 [employer]. This information is not intended to replace advice given to you by your health care provider. Make sure you discuss any questions you have with your health care provider. ° °

## 2014-08-28 NOTE — Progress Notes (Signed)
INFECTIOUS DISEASE PROGRESS NOTE  ID: ARRINGTON YOHE is a 44 y.o. male with  Principal Problem:   Recurrent pneumonia Active Problems:   CAP (community acquired pneumonia)   Essential hypertension   Genital herpes   AIDS   Tachycardia   Acute on chronic respiratory failure with hypoxia   PCP (pneumocystis carinii pneumonia)  Subjective: No SOB. Had night sweat last night.   Abtx:  Anti-infectives    Start     Dose/Rate Route Frequency Ordered Stop   08/28/14 0800  elvitegravir-cobicistat-emtricitabine-tenofovir (GENVOYA) 150-150-200-10 MG tablet 1 tablet     1 tablet Oral Daily with breakfast 08/27/14 1101     08/26/14 1800  azithromycin (ZITHROMAX) tablet 1,200 mg     1,200 mg Oral Weekly 08/26/14 1617     08/26/14 1800  sulfamethoxazole-trimethoprim (BACTRIM DS,SEPTRA DS) 800-160 MG per tablet 2 tablet     2 tablet Oral Every 8 hours 08/26/14 1617     08/24/14 1300  levofloxacin (LEVAQUIN) IVPB 750 mg     750 mg 100 mL/hr over 90 Minutes Intravenous Every 24 hours 08/24/14 1156 08/25/14 1340   08/24/14 1000  valACYclovir (VALTREX) tablet 500 mg     500 mg Oral Daily 08/24/14 0026     08/24/14 1000  vancomycin (VANCOCIN) IVPB 750 mg/150 ml premix  Status:  Discontinued     750 mg 150 mL/hr over 60 Minutes Intravenous Every 8 hours 08/24/14 0050 08/24/14 1156   08/24/14 1000  sulfamethoxazole-trimethoprim (BACTRIM) 320 mg in dextrose 5 % 500 mL IVPB  Status:  Discontinued     320 mg 346.7 mL/hr over 90 Minutes Intravenous Every 8 hours 08/24/14 0900 08/26/14 1617   08/24/14 0800  ceFEPIme (MAXIPIME) 1 g in dextrose 5 % 50 mL IVPB  Status:  Discontinued     1 g 100 mL/hr over 30 Minutes Intravenous Every 8 hours 08/24/14 0026 08/24/14 1156   08/23/14 2315  ceFEPIme (MAXIPIME) 2 g in dextrose 5 % 50 mL IVPB     2 g 100 mL/hr over 30 Minutes Intravenous  Once 08/23/14 2307 08/24/14 0016   08/23/14 2315  vancomycin (VANCOCIN) IVPB 1000 mg/200 mL premix     1,000 mg 200  mL/hr over 60 Minutes Intravenous  Once 08/23/14 2307 08/24/14 0217      Medications:  Scheduled: . azithromycin  1,200 mg Oral Weekly  . elvitegravir-cobicistat-emtricitabine-tenofovir  1 tablet Oral Q breakfast  . enoxaparin (LOVENOX) injection  40 mg Subcutaneous QHS  . predniSONE  40 mg Oral BID WC  . sulfamethoxazole-trimethoprim  2 tablet Oral Q8H  . valACYclovir  500 mg Oral Daily    Objective: Vital signs in last 24 hours: Temp:  [97.8 F (36.6 C)-98 F (36.7 C)] 98 F (36.7 C) (03/04 0528) Pulse Rate:  [77-81] 77 (03/04 0528) Resp:  [20-22] 20 (03/04 0528) BP: (110)/(69-73) 110/73 mmHg (03/04 0528) SpO2:  [98 %] 98 % (03/04 0528)   General appearance: alert, cooperative, cachectic and no distress Resp: clear to auscultation bilaterally Cardio: regular rate and rhythm GI: normal findings: bowel sounds normal and soft, non-tender  Lab Results  Recent Labs  08/26/14 0555  WBC 9.6  HGB 12.9*  HCT 37.4*  NA 138  K 3.9  CL 107  CO2 26  BUN 9  CREATININE 0.98   Liver Panel No results for input(s): PROT, ALBUMIN, AST, ALT, ALKPHOS, BILITOT, BILIDIR, IBILI in the last 72 hours. Sedimentation Rate No results for input(s): ESRSEDRATE in the last  72 hours. C-Reactive Protein No results for input(s): CRP in the last 72 hours.  Microbiology: Recent Results (from the past 240 hour(s))  Culture, blood (routine x 2)     Status: None (Preliminary result)   Collection Time: 08/23/14 10:41 PM  Result Value Ref Range Status   Specimen Description BLOOD R HAND  Final   Special Requests BOTTLES DRAWN AEROBIC AND ANAEROBIC 5ML  Final   Culture   Final           BLOOD CULTURE RECEIVED NO GROWTH TO DATE CULTURE WILL BE HELD FOR 5 DAYS BEFORE ISSUING A FINAL NEGATIVE REPORT Performed at [facility]    Report Status PENDING  Incomplete  Culture, blood (routine x 2)     Status: None (Preliminary result)   Collection Time: 08/23/14 10:41 PM  Result Value Ref  Range Status   Specimen Description BLOOD LEFT ANTECUBITAL  Final   Special Requests BOTTLES DRAWN AEROBIC AND ANAEROBIC 3ML  Final   Culture   Final           BLOOD CULTURE RECEIVED NO GROWTH TO DATE CULTURE WILL BE HELD FOR 5 DAYS BEFORE ISSUING A FINAL NEGATIVE REPORT Performed at [facility]    Report Status PENDING  Incomplete  Respiratory virus panel (routine influenza)     Status: None   Collection Time: 08/24/14  1:10 AM  Result Value Ref Range Status   Source - RVPAN NOSE  Corrected   Respiratory Syncytial Virus A Negative Negative Final   Respiratory Syncytial Virus B Negative Negative Final   Influenza A Negative Negative Final   Influenza B Negative Negative Final   Parainfluenza 1 Negative Negative Final   Parainfluenza 2 Negative Negative Final   Parainfluenza 3 Negative Negative Final   Metapneumovirus Negative Negative Final   Rhinovirus Negative Negative Final   Adenovirus Negative Negative Final    Comment: (NOTE) Performed At: [facility] [address] [place], [place] [id number] Lindon Romp MD RR:[id number]   Culture, expectorated sputum-assessment     Status: None   Collection Time: 08/24/14  8:44 AM  Result Value Ref Range Status   Specimen Description SPUTUM  Final   Special Requests NONE  Final   Sputum evaluation   Final    THIS SPECIMEN IS ACCEPTABLE. RESPIRATORY CULTURE REPORT TO FOLLOW.   Report Status 08/24/2014 FINAL  Final  Culture, respiratory (NON-Expectorated)     Status: None   Collection Time: 08/24/14  8:44 AM  Result Value Ref Range Status   Specimen Description SPUTUM  Final   Special Requests NONE  Final   Gram Stain   Final    FEW WBC PRESENT, PREDOMINANTLY PMN FEW SQUAMOUS EPITHELIAL CELLS PRESENT FEW GRAM POSITIVE COCCI IN PAIRS IN CHAINS IN CLUSTERS FEW GRAM POSITIVE RODS FEW GRAM NEGATIVE RODS    Culture   Final    NORMAL OROPHARYNGEAL FLORA Note: MICROSCOPIC FINDINGS SUGGEST THAT THIS SPECIMEN IS  NOT REPRESENTATIVE OF LOWER RESPIRATORY SECRETIONS. PLEASE RECOLLECT. Performed at [facility]    Report Status 08/26/2014 FINAL  Final  Pneumocystis smear by DFA     Status: None   Collection Time: 08/25/14  8:40 AM  Result Value Ref Range Status   Specimen Source-PJSRC SPUTUM  Final   Pneumocystis jiroveci Ag NEGATIVE  Final    Comment: Performed at [facility]  AFB culture with smear     Status: None (Preliminary result)   Collection Time: 08/25/14 11:10 AM  Result Value Ref Range Status   Specimen Description BRONCHIAL ALVEOLAR LAVAGE  Final   Special Requests Immunocompromised  Final   Acid Fast Smear   Final    NO ACID FAST BACILLI SEEN Performed at [facility]    Culture   Final    CULTURE WILL BE EXAMINED FOR 6 WEEKS BEFORE ISSUING A FINAL REPORT Performed at [facility]    Report Status PENDING  Incomplete  Culture, bal-quantitative     Status: None   Collection Time: 08/25/14 11:10 AM  Result Value Ref Range Status   Specimen Description BRONCHIAL ALVEOLAR LAVAGE  Final   Special Requests Immunocompromised  Final   Gram Stain   Final    RARE WBC PRESENT, PREDOMINANTLY PMN RARE SQUAMOUS EPITHELIAL CELLS PRESENT NO ORGANISMS SEEN Performed at [facility]   Final    >=100,000 COLONIES/ML Performed at [facility]    Culture   Final    Non-Pathogenic Oropharyngeal-type Flora Isolated. Performed at [facility]    Report Status 08/27/2014 FINAL  Final  Fungus Culture with Smear     Status: None (Preliminary result)   Collection Time: 08/25/14 11:10 AM  Result Value Ref Range Status   Specimen Description BRONCHIAL ALVEOLAR LAVAGE  Final   Special Requests Immunocompromised  Final   Fungal Smear   Final    NO YEAST OR FUNGAL ELEMENTS SEEN Performed at [facility]    Culture   Final    CULTURE IN PROGRESS FOR FOUR WEEKS Performed at [facility]    Report Status  PENDING  Incomplete  Pneumocystis smear by DFA     Status: None   Collection Time: 08/25/14 11:10 AM  Result Value Ref Range Status   Specimen Source-PJSRC BRONCHIAL ALVEOLAR LAVAGE  Final   Pneumocystis jiroveci Ag POSITIVE  Final    Comment: Performed at [facility]  Virus culture     Status: None   Collection Time: 08/25/14 11:10 AM  Result Value Ref Range Status   Viral Culture Comment  Final    Comment: (NOTE) Preliminary Report: No virus isolated at 24 hours. Next report to follow after 4 days. Performed At: [facility] [place], [place] [id number] Lindon Romp MD HG:[id number]    Source of Sample BRONCHIAL ALVEOLAR LAVAGE  Final    Studies/Results: No results found.   Assessment/Plan: AIDS PCP Erectile dysfunction Anal warts (surgery 2015) HSV Insomnia  Continue prednisone/bactrim, azithro. Appreciate pharm assistance Await his viral studies.  Pending- HIV RNA/genotype, HLA [id number],   Start hep a/b F/u with me on 09-09-14 at [place] Will get his first dose of genvoya today.  21 day prednisone taper (ie 16 more days) Bactrim tid to complete 21 days (ie 16 more days) then change to qday Would plan for him to go home today.  Appreciate CM eval of his insurance cover age for his ART. Will have him stop in at [facility] for med coverage eval, intake.  Ok to use viagra, low dose, sparingly (no more than every 48h), caution for prolonged erections given.  Sleep aide per [facility] Will have him f/u with [facility] for repeat HRA Will get his wife into clinic for PREP  Total days of antibiotics: 5 levaquin (d/c), Day 5 bactrim/prednisone          Bobby Rumpf Infectious Diseases (pager) [phone] [email or web address] 08/28/2014, 8:44 AM  LOS: 4 days  F/u 09-09-14

## 2014-08-28 NOTE — Discharge Summary (Signed)
Physician Discharge Summary  Casey SeminoleBenjamin E Hurley GNF:[id number] DOB: 06-06-1971 DOA: 08/23/2014  PCP: Astrid DivineGRIFFIN,ELAINE COLLINS, MD  Admit date: 08/23/2014 Discharge date: 08/28/2014  Recommendations for Outpatient Follow-up:  F/u with ID on 09-09-14 at 9am Continue prednisone 40 mg once a day for 5 days then 20 mg for 11 days. Continue Bactrim 3 times daily for 16 days and then once a day. Follow-up with primary care physician per scheduled appointment.   Patient receive hepatitis A and B vaccine prior to discharge. Patient also had tuberculin injection during this hospitalization.   Discharge Diagnoses:  Principal Problem:   Recurrent pneumonia Active Problems:   CAP (community acquired pneumonia)   Essential hypertension   Genital herpes   AIDS   Tachycardia   Acute on chronic respiratory failure with hypoxia   PCP (pneumocystis carinii pneumonia)    Discharge Condition: stable   Diet recommendation: as tolerated   History of present illness:  44 y.o. male who [facility] to [facility] with progressive dyspnea on exertion, night sweats, low-grade fevers for last 10 days prior to this admission. Patient reported having similar symptoms back in December 2015 and was previously treated with antibiotics in December. Patient reported that his shortness of breath has worsened since then and now he apparently was not able to ambulate even short distances without becoming short of breath. He was on Levaquin prior to the admission.  On admission, patient was found to have positive HIV with CD4 count of 20. Patient underwent bronchoscopy with washings. The chest x-ray on the admission showed increasing bilateral lower lobe airspace opacities concerning for pneumonia. Bronchial alveolar lavage was positive for PCP. Patient is on treatment for PCP per ID recommendations.  Assessment/Plan:     Principal problem: Acute respiratory failure with hypoxia / PCP pneumonia - Hypoxia  likely secondary to PCP pneumonia. Patient underwent bronchoscopy with lavage. Bronchial alveolar lavage was positive for PCP. Patient is on treatment for PCP. - As noted above, patient will continue taking prednisone taper as prescribed. He will also continue Bactrim 3 times daily for 16 days and then once a day regimen. Patient will follow-up with infectious disease per scheduled appointment. - Respiratory status remains stable. Patient is not short of breath. - Of note, patient was on vancomycin and cefepime from the time of the admission. This was then changed to Levaquin. Levaquin was stopped per pulmonary 08/26/2014. - No yeast / fungus is identified on BAL. Blood cultures to date are negative. BAL culture showed non-pathogenic oropharyngeal flora. No acid-fast bacilli seen on BAL. - Respiratory virus panel is negative. Legionella is negative. - Respiratory culture showed a normal oropharyngeal flora.  Active problems: HIV/AIDS - HIV test positive. CD4 count 20. Viral load is still pending. Genotype is pending. - Infectious disease was seen the patient throughout the hospital stay. - Patient has received tuberculin injection. - Hepatitis panel negative. RPR nonreactive. - Received hepatitis A and B vaccine prior to discharge   Essential hypertension - Blood pressure stable.  Sinus tachycardia - Likely secondary to pneumonia - Now controlled.   DVT Prophylaxis  - Lovenox subcutaneous ordered while patient in hospital   Code Status: Full.  Family Communication: plan of care discussed with the patient   IV access:  Peripheral IV  Procedures and diagnostic studies:   Dg Chest 2 View 08/23/2014 Increasing bilateral lower lobe airspace opacities concerning for pneumonia.   Ct Angio Chest Pe W/cm &/or Wo Cm 08/23/2014 1. Diffuse airspace disease, progressed over the past  10 days. The pattern suggests atypical/opportunistic infection or inflammatory pneumonia  (especially hypersensitivity pneumonitis). 2. No evidence of pulmonary embolism.   Bronchoscopy: Dr. Delton Coombes 08/25/2014  Medical Consultants:  Pulmonary: Dr. Delton Coombes 08/24/2014 ID: Dr. Drue Second 08/24/2014  Other Consultants:   IAnti-Infectives:   Vancomycin 08/23/2014--> 08/25/2014 Cefepime 08/23/2014 --> 08/25/2014 Bactrim 08/24/2014 --> please note follow up section above for duration adn dosing of bactrim on discharge  Levaquin 08/25/2014 --> 08/26/2014  Signed:  Manson Passey, MD  [facility] 08/28/2014, 9:46 AM  Pager #: [phone]    Discharge Exam: Filed Vitals:   08/28/14 0528  BP: 110/73  Pulse: 77  Temp: 98 F (36.7 C)  Resp: 20   Filed Vitals:   08/26/14 2108 08/27/14 0526 08/27/14 2143 08/28/14 0528  BP: 125/78 107/63 110/69 110/73  Pulse: 103 82 81 77  Temp: 98.1 F (36.7 C) 98 F (36.7 C) 97.8 F (36.6 C) 98 F (36.7 C)  TempSrc: Oral Oral Oral Oral  Resp: 18 24 22 20   Height:      Weight:  60.7 kg (133 lb 13.1 oz)    SpO2: 98% 100% 98% 98%    General: Pt is alert, follows commands appropriately, not in acute distress Cardiovascular: Regular rate and rhythm, S1/S2 +, no murmurs Respiratory: Clear to auscultation bilaterally, no wheezing, no crackles, no rhonchi Abdominal: Soft, non tender, non distended, bowel sounds +, no guarding Extremities: no edema, no cyanosis, pulses palpable bilaterally DP and PT Neuro: Grossly nonfocal  Discharge Instructions  Discharge Instructions    Call MD for:  difficulty breathing, headache or visual disturbances    Complete by:  As directed      Call MD for:  persistant dizziness or light-headedness    Complete by:  As directed      Call MD for:  persistant nausea and vomiting    Complete by:  As directed      Call MD for:  severe uncontrolled pain    Complete by:  As directed      Diet - low sodium heart healthy    Complete by:  As directed      Discharge instructions    Complete by:  As  directed   F/u with ID on 09-09-14 at 9am Continue prednisone 40 mg once a day for 5 days then 20 mg for 11 days. Continue Bactrim 3 times daily for 16 days and then once a day. Follow-up with primary care physician per scheduled appointment.      Increase activity slowly    Complete by:  As directed             Medication List    STOP taking these medications        ALPRAZolam 0.5 MG tablet  Commonly known as:  XANAX     levofloxacin 750 MG tablet  Commonly known as:  LEVAQUIN      TAKE these medications        azithromycin 600 MG tablet  Commonly known as:  ZITHROMAX  Take 2 tablets (1,200 mg total) by mouth once a week.     elvitegravir-cobicistat-emtricitabine-tenofovir 150-150-200-10 MG Tabs tablet  Commonly known as:  GENVOYA  Take 1 tablet by mouth daily with breakfast.     lidocaine 2 % jelly  Commonly known as:  XYLOCAINE JELLY  Apply to affected area daily prn     LORazepam 0.5 MG tablet  Commonly known as:  ATIVAN  Take 1 tablet (0.5 mg total) by mouth 2 (  two) times daily as needed for anxiety (restlessness).     predniSONE 20 MG tablet  Commonly known as:  DELTASONE  Take 2 tablets (40 mg total) by mouth 2 (two) times daily with a meal.     sulfamethoxazole-trimethoprim 800-160 MG per tablet  Commonly known as:  BACTRIM DS,SEPTRA DS  Take 2 tablets by mouth every 8 (eight) hours.     sulfamethoxazole-trimethoprim 800-160 MG per tablet  Commonly known as:  BACTRIM DS,SEPTRA DS  Take 1 tablet by mouth daily.  Start taking on:  09/14/2014     valACYclovir 500 MG tablet  Commonly known as:  VALTREX  Take 500 mg by mouth daily.           Follow-up Information    Follow up with Astrid Divine, MD. Schedule an appointment as soon as possible for a visit in 2 weeks.   Specialty:  Family Medicine   Why:  Follow up appt after recent hospitalization   Contact information:   [address] [place] [address] [phone]         The results of significant diagnostics from this hospitalization (including imaging, microbiology, ancillary and laboratory) are listed below for reference.    Significant Diagnostic Studies: Dg Chest 2 View  08/23/2014   CLINICAL DATA:  Bilateral lower chest pain.  Bilateral pneumonia.  EXAM: CHEST  2 VIEW  COMPARISON:  08/13/2014  FINDINGS: Hyperinflation of the lungs. Increased opacities in both lower lung fields. This is concerning for pneumonia. This has increased slightly since prior study. No effusions. Heart is normal size. No acute bony abnormality.  IMPRESSION: Increasing bilateral lower lobe airspace opacities concerning for pneumonia.   Electronically Signed   By: Charlett Nose M.D.   On: 08/23/2014 20:35   Dg Chest 2 View  08/13/2014   CLINICAL DATA:  Recurrent fever cough shortness of breath and mid anterior chest pain for the past 2 months, discontinue smoking 2 months ago; initial visit  EXAM: CHEST  2 VIEW  COMPARISON:  PA and lateral chest x-ray of May 27, 2014  FINDINGS: The lungs are mildly hyperinflated with hemidiaphragm flattening. There are patchy interstitial and early alveolar infiltrates in both lower lobes and in the right middle lobe. There is no pleural effusion. The heart and pulmonary vascularity are normal. The mediastinum is normal in width. The bony thorax is unremarkable.  IMPRESSION: COPD or reactive airway disease. Patchy bilateral lower lobe and right middle lobe pneumonia. Follow-up radiographs following anticipated antibiotic therapy are recommended to assure clearing.   Electronically Signed   By: David  [place]   On: 08/13/2014 14:12   Ct Angio Chest Pe W/cm &/or Wo Cm  08/23/2014   CLINICAL DATA:  Increasing heart rate. Currently under therapy for pneumonia.  EXAM: CT ANGIOGRAPHY CHEST WITH CONTRAST  TECHNIQUE: Multidetector CT imaging of the chest was performed using the standard protocol during bolus administration of intravenous contrast.  Multiplanar CT image reconstructions and MIPs were obtained to evaluate the vascular anatomy.  CONTRAST:  OMNIPAQUE IOHEXOL 350 MG/ML SOLN  COMPARISON:  None.  FINDINGS: THORACIC INLET/BODY WALL:  No acute abnormality.  MEDIASTINUM:  Normal heart size. No pericardial effusion. Motion and bolus dispersion decreases sensitivity for detecting pulmonary embolism, but the study is overall diagnostic and negative for pulmonary embolism. No acute aortic findings.  LUNG WINDOWS:  There is patchy ground-glass opacity throughout the lungs, with the basilar predominance. Associated subsegmental atelectasis diffusely. No cavitary change, air bronchogram, or  prominent interlobular septal thickening. No pleural effusion. Lung disease has been present since at least 08/13/2014, and progressive.  UPPER ABDOMEN:  No acute findings.  OSSEOUS:  No acute fracture.  No suspicious lytic or blastic lesions.  Review of the MIP images confirms the above findings.  IMPRESSION: 1. Diffuse airspace disease, progressed over the past 10 days. The pattern suggests atypical/opportunistic infection or inflammatory pneumonia (especially hypersensitivity pneumonitis). 2. No evidence of pulmonary embolism.   Electronically Signed   By: Marnee Spring M.D.   On: 08/23/2014 22:25    Microbiology: Recent Results (from the past 240 hour(s))  Culture, blood (routine x 2)     Status: None (Preliminary result)   Collection Time: 08/23/14 10:41 PM  Result Value Ref Range Status   Specimen Description BLOOD R HAND  Final   Special Requests BOTTLES DRAWN AEROBIC AND ANAEROBIC  Final   Culture   Final           BLOOD CULTURE RECEIVED NO GROWTH TO DATE CULTURE WILL BE HELD FOR 5 DAYS BEFORE ISSUING A FINAL NEGATIVE REPORT Performed at [facility]    Report Status PENDING  Incomplete  Culture, blood (routine x 2)     Status: None (Preliminary result)   Collection Time: 08/23/14 10:41 PM  Result Value Ref Range Status    Specimen Description BLOOD LEFT ANTECUBITAL  Final   Special Requests BOTTLES DRAWN AEROBIC AND ANAEROBIC  Final   Culture   Final           BLOOD CULTURE RECEIVED NO GROWTH TO DATE CULTURE WILL BE HELD FOR 5 DAYS BEFORE ISSUING A FINAL NEGATIVE REPORT Performed at [facility]    Report Status PENDING  Incomplete  Respiratory virus panel (routine influenza)     Status: None   Collection Time: 08/24/14  1:10 AM  Result Value Ref Range Status   Source - RVPAN NOSE  Corrected   Respiratory Syncytial Virus A Negative Negative Final   Respiratory Syncytial Virus B Negative Negative Final   Influenza A Negative Negative Final   Influenza B Negative Negative Final   Parainfluenza 1 Negative Negative Final   Parainfluenza 2 Negative Negative Final   Parainfluenza 3 Negative Negative Final   Metapneumovirus Negative Negative Final   Rhinovirus Negative Negative Final   Adenovirus Negative Negative Final    Comment: (NOTE) Performed At: [facility] [address] [place], [place] [id number] Mila Homer MD WU:[id number]   Culture, expectorated sputum-assessment     Status: None   Collection Time: 08/24/14  8:44 AM  Result Value Ref Range Status   Specimen Description SPUTUM  Final   Special Requests NONE  Final   Sputum evaluation   Final    THIS SPECIMEN IS ACCEPTABLE. RESPIRATORY CULTURE REPORT TO FOLLOW.   Report Status 08/24/2014 FINAL  Final  Culture, respiratory (NON-Expectorated)     Status: None   Collection Time: 08/24/14  8:44 AM  Result Value Ref Range Status   Specimen Description SPUTUM  Final   Special Requests NONE  Final   Gram Stain   Final    FEW WBC PRESENT, PREDOMINANTLY PMN FEW SQUAMOUS EPITHELIAL CELLS PRESENT FEW GRAM POSITIVE COCCI IN PAIRS IN CHAINS IN CLUSTERS FEW GRAM POSITIVE RODS FEW GRAM NEGATIVE RODS    Culture   Final    NORMAL OROPHARYNGEAL FLORA Note: MICROSCOPIC FINDINGS SUGGEST THAT THIS SPECIMEN IS NOT REPRESENTATIVE  OF LOWER RESPIRATORY SECRETIONS. PLEASE RECOLLECT. Performed at [facility]    Report Status 08/26/2014 FINAL  Final  Pneumocystis smear by DFA     Status: None   Collection Time: 08/25/14  8:40 AM  Result Value Ref Range Status   Specimen Source-PJSRC SPUTUM  Final   Pneumocystis jiroveci Ag NEGATIVE  Final    Comment: Performed at [facility]  AFB culture with smear     Status: None (Preliminary result)   Collection Time: 08/25/14 11:10 AM  Result Value Ref Range Status   Specimen Description BRONCHIAL ALVEOLAR LAVAGE  Final   Special Requests Immunocompromised  Final   Acid Fast Smear   Final    NO ACID FAST BACILLI SEEN Performed at [facility]    Culture   Final    CULTURE WILL BE EXAMINED FOR 6 WEEKS BEFORE ISSUING A FINAL REPORT Performed at [facility]    Report Status PENDING  Incomplete  Culture, bal-quantitative     Status: None   Collection Time: 08/25/14 11:10 AM  Result Value Ref Range Status   Specimen Description BRONCHIAL ALVEOLAR LAVAGE  Final   Special Requests Immunocompromised  Final   Gram Stain   Final    RARE WBC PRESENT, PREDOMINANTLY PMN RARE SQUAMOUS EPITHELIAL CELLS PRESENT NO ORGANISMS SEEN Performed at [facility]   Final    >=100,000 COLONIES/ML Performed at [facility]    Culture   Final    Non-Pathogenic Oropharyngeal-type Flora Isolated. Performed at [facility]    Report Status 08/27/2014 FINAL  Final  Fungus Culture with Smear     Status: None (Preliminary result)   Collection Time: 08/25/14 11:10 AM  Result Value Ref Range Status   Specimen Description BRONCHIAL ALVEOLAR LAVAGE  Final   Special Requests Immunocompromised  Final   Fungal Smear   Final    NO YEAST OR FUNGAL ELEMENTS SEEN Performed at [facility]    Culture   Final    CULTURE IN PROGRESS FOR FOUR WEEKS Performed at [facility]    Report Status PENDING  Incomplete   Pneumocystis smear by DFA     Status: None   Collection Time: 08/25/14 11:10 AM  Result Value Ref Range Status   Specimen Source-PJSRC BRONCHIAL ALVEOLAR LAVAGE  Final   Pneumocystis jiroveci Ag POSITIVE  Final    Comment: Performed at [facility]  Virus culture     Status: None   Collection Time: 08/25/14 11:10 AM  Result Value Ref Range Status   Viral Culture Comment  Final    Comment: (NOTE) Preliminary Report: No virus isolated at 24 hours. Next report to follow after 4 days. Performed At: [facility] [address] [place], [place] [id number] Mila Homer MD WJ:[id number]    Source of Sample BRONCHIAL ALVEOLAR LAVAGE  Final     Labs: Basic Metabolic Panel:  Recent Labs Lab 08/23/14 2014 08/25/14 0500 08/26/14 0555  NA 137 139 138  K 3.3* 3.8 3.9  CL 102 106 107  CO2 GLUCOSE 112* 145* 122*  BUN CREATININE 1.08 0.91 0.98  CALCIUM 9.1 9.1 9.0   Liver Function Tests:  Recent Labs Lab 08/23/14 2352 08/25/14 0500  AST 31 27  ALT 26 25  ALKPHOS 100 92  BILITOT 0.5 0.2*  PROT 7.6 7.3  ALBUMIN 3.2* 3.1*   No results for input(s): LIPASE, AMYLASE in the last 168 hours.  No results for input(s): AMMONIA in the last 168 hours. CBC:  Recent Labs Lab 08/23/14 2014 08/23/14 2352 08/25/14 0500 08/26/14 0555  WBC 6.1 7.1 5.5 9.6  NEUTROABS  --  4.7 4.2  --   HGB 15.2 14.0 13.3 12.9*  HCT 42.5 40.2 37.4* 37.4*  MCV 89.7 91.0 89.9 90.3  PLT 243 234 238 264   Cardiac Enzymes: No results for input(s): CKTOTAL, CKMB, CKMBINDEX, TROPONINI in the last 168 hours. BNP: BNP (last 3 results)  Recent Labs  08/23/14 2014  BNP 9.8    ProBNP (last 3 results) No results for input(s): PROBNP in the last 8760 hours.  CBG: No results for input(s): GLUCAP in the last 168 hours.  Time coordinating discharge: Over 30 minutes

## 2014-08-30 LAB — CULTURE, BLOOD (ROUTINE X 2)
Culture: NO GROWTH
Culture: NO GROWTH

## 2014-08-30 LAB — HIV-1 RNA QUANT-NO REFLEX-BLD
HIV 1 RNA QUANT: 80520 {copies}/mL
LOG10 HIV-1 RNA: 4.906 {Log_copies}/mL

## 2014-08-31 ENCOUNTER — Telehealth: Payer: Self-pay | Admitting: *Deleted

## 2014-08-31 NOTE — Telephone Encounter (Signed)
Unable to fax FMLA paperwork. Patient has not filled out or signed his part.  RN notified patient, it will be upfront for him to pick up and submit on his own.  Patient verbalized understanding and agreement.  Copy made for patient's chart. Andree CossHowell, Marilynn Ekstein M, RN

## 2014-09-01 ENCOUNTER — Encounter: Payer: Self-pay | Admitting: *Deleted

## 2014-09-01 ENCOUNTER — Encounter: Payer: Self-pay | Admitting: Internal Medicine

## 2014-09-01 ENCOUNTER — Ambulatory Visit (INDEPENDENT_AMBULATORY_CARE_PROVIDER_SITE_OTHER): Payer: BC Managed Care – PPO | Admitting: Internal Medicine

## 2014-09-01 VITALS — BP 142/97 | HR 99 | Temp 98.5°F | Ht 71.0 in | Wt 134.0 lb

## 2014-09-01 DIAGNOSIS — B3781 Candidal esophagitis: Secondary | ICD-10-CM

## 2014-09-01 DIAGNOSIS — B370 Candidal stomatitis: Secondary | ICD-10-CM | POA: Insufficient documentation

## 2014-09-01 DIAGNOSIS — B59 Pneumocystosis: Secondary | ICD-10-CM

## 2014-09-01 DIAGNOSIS — B20 Human immunodeficiency virus [HIV] disease: Secondary | ICD-10-CM | POA: Diagnosis not present

## 2014-09-01 MED ORDER — FLUCONAZOLE 200 MG PO TABS: 200.0000 mg | ORAL_TABLET | Freq: Every day | ORAL | Status: DC

## 2014-09-01 NOTE — Assessment & Plan Note (Signed)
He says his breathing is doing well and his oxygen saturation has been in the 90s, lowest has been 94. That is with activity.

## 2014-09-01 NOTE — Progress Notes (Signed)
   Subjective:    Patient ID: Casey Hurley, male    DOB: [birth year], 44 y.o.   MRN: [id number]  HPI He comes in here for a work in visit. He was recently in the hospital and newly diagnosed HIV with PCP pneumonia. He has an appointment next week with his primary provider but comes in today with thrush. He noted white coating in his tongue and down his throat. No difficulty swallowing.     Review of Systems  Constitutional: Positive for fatigue.  HENT: Negative for trouble swallowing.   Gastrointestinal: Positive for nausea. Negative for diarrhea.       Mild nausea  Skin: Negative for rash.  Neurological: Negative for dizziness.       Objective:   Physical Exam  Constitutional:  Thin-appearing  HENT:  He has notable thrush on his tongue and oropharynx  Eyes: No scleral icterus.  Lymphadenopathy:    He has no cervical adenopathy.  Skin: No rash noted.          Assessment & Plan:

## 2014-09-01 NOTE — Assessment & Plan Note (Signed)
He is doing well with the Genvoya and will continue.

## 2014-09-01 NOTE — Assessment & Plan Note (Signed)
We'll give him fluconazole

## 2014-09-02 LAB — VIRUS CULTURE

## 2014-09-07 ENCOUNTER — Telehealth: Payer: Self-pay | Admitting: *Deleted

## 2014-09-07 NOTE — Telephone Encounter (Signed)
Patient has an appt with Dr. Ninetta LightsHatcher Friday.  Started Casey Hurley when he was discharged from the hosiptal.  Concerned that the fever may be a side effect of the Genvoya.  Pt advised to drink increased liquids due to evaporation from increased temperature.  Pt taking all prescribed medications.

## 2014-09-09 ENCOUNTER — Inpatient Hospital Stay: Payer: BC Managed Care – PPO | Admitting: Infectious Diseases

## 2014-09-11 ENCOUNTER — Ambulatory Visit (INDEPENDENT_AMBULATORY_CARE_PROVIDER_SITE_OTHER): Payer: BC Managed Care – PPO | Admitting: Infectious Diseases

## 2014-09-11 ENCOUNTER — Encounter: Payer: Self-pay | Admitting: Infectious Diseases

## 2014-09-11 VITALS — BP 106/75 | HR 94 | Temp 98.3°F | Wt 130.0 lb

## 2014-09-11 DIAGNOSIS — R739 Hyperglycemia, unspecified: Secondary | ICD-10-CM

## 2014-09-11 DIAGNOSIS — A630 Anogenital (venereal) warts: Secondary | ICD-10-CM | POA: Diagnosis not present

## 2014-09-11 DIAGNOSIS — B59 Pneumocystosis: Secondary | ICD-10-CM

## 2014-09-11 DIAGNOSIS — B20 Human immunodeficiency virus [HIV] disease: Secondary | ICD-10-CM

## 2014-09-11 DIAGNOSIS — B3781 Candidal esophagitis: Secondary | ICD-10-CM

## 2014-09-11 DIAGNOSIS — B370 Candidal stomatitis: Secondary | ICD-10-CM

## 2014-09-11 MED ORDER — SULFAMETHOXAZOLE-TRIMETHOPRIM 800-160 MG PO TABS: 1.0000 | ORAL_TABLET | Freq: Once | ORAL | Status: AC

## 2014-09-11 MED ORDER — ELVITEG-COBIC-EMTRICIT-TENOFAF 150-150-200-10 MG PO TABS: 1.0000 | ORAL_TABLET | Freq: Every day | ORAL | Status: DC

## 2014-09-11 MED ORDER — AZITHROMYCIN 600 MG PO TABS: 1200.0000 mg | ORAL_TABLET | ORAL | Status: DC

## 2014-09-11 NOTE — Assessment & Plan Note (Signed)
Doing well Will refill his bactrim rtc 6 weeks.

## 2014-09-11 NOTE — Assessment & Plan Note (Signed)
Will f/u with Dr Maisie Fushomas, greatly appreciate her eval.

## 2014-09-11 NOTE — Assessment & Plan Note (Signed)
Will watch.  Has FHx dm , father.

## 2014-09-11 NOTE — Assessment & Plan Note (Signed)
He is doing very well.  He is taking meds well.  Will refill his azithro.  He has f/u with his PCP as well.  Offered, given condoms.  rtc 6 weeks.

## 2014-09-11 NOTE — Assessment & Plan Note (Signed)
Doing great.

## 2014-09-11 NOTE — Progress Notes (Signed)
   Subjective:    Patient ID: Casey Hurley, male    DOB: [birth year], 44 y.o.   MRN: [id number]  HPI 44 yo M with recently dx AIDS/PCP, adm to [facility] 2-28 to 3-4. Was started on genvoya in hospital. No problems with this. Has had some ankle pain. Has been doing house work, going fishing.  Was seen in ID 3-8 for thrush, rx for 15 days.  Will change to bactrim qday on 3-21. Has completed prednisone, for last week.  Needs azithro refill.   Review of Systems  Constitutional: Positive for unexpected weight change. Negative for fever, chills and appetite change.  Respiratory: Negative for cough and shortness of breath.   Gastrointestinal: Positive for constipation.  Genitourinary: Negative for difficulty urinating.       Objective:   Physical Exam  Constitutional: He appears well-developed and well-nourished.  HENT:  Mouth/Throat: No oropharyngeal exudate.  Eyes: EOM are normal. Pupils are equal, round, and reactive to light.  Neck: Neck supple.  Cardiovascular: Normal rate, regular rhythm and normal heart sounds.   Pulmonary/Chest: Effort normal and breath sounds normal.  Abdominal: Soft. Bowel sounds are normal. He exhibits no distension. There is no tenderness.  Lymphadenopathy:    He has no cervical adenopathy.       Assessment & Plan:

## 2014-09-21 LAB — FUNGUS CULTURE W SMEAR: Fungal Smear: NONE SEEN

## 2014-10-07 LAB — AFB CULTURE WITH SMEAR (NOT AT ARMC): ACID FAST SMEAR: NONE SEEN

## 2014-10-08 ENCOUNTER — Other Ambulatory Visit: Payer: BC Managed Care – PPO

## 2014-10-08 DIAGNOSIS — B20 Human immunodeficiency virus [HIV] disease: Secondary | ICD-10-CM

## 2014-10-09 LAB — HIV-1 RNA ULTRAQUANT REFLEX TO GENTYP+
HIV 1 RNA Quant: 42 copies/mL — ABNORMAL HIGH (ref <20)
HIV-1 RNA QUANT, LOG: 1.62 {Log} — AB (ref <1.30)

## 2014-10-09 LAB — T-HELPER CELL (CD4) - (RCID CLINIC ONLY)
CD4 T CELL ABS: 30 /uL — AB (ref 400–2700)
CD4 T CELL HELPER: 2 % — AB (ref 33–55)

## 2014-10-22 ENCOUNTER — Ambulatory Visit (INDEPENDENT_AMBULATORY_CARE_PROVIDER_SITE_OTHER): Payer: BC Managed Care – PPO | Admitting: Infectious Diseases

## 2014-10-22 ENCOUNTER — Encounter: Payer: Self-pay | Admitting: Infectious Diseases

## 2014-10-22 VITALS — BP 129/91 | HR 85 | Temp 98.2°F | Ht 71.0 in | Wt 141.0 lb

## 2014-10-22 DIAGNOSIS — B20 Human immunodeficiency virus [HIV] disease: Secondary | ICD-10-CM

## 2014-10-22 DIAGNOSIS — B081 Molluscum contagiosum: Secondary | ICD-10-CM | POA: Insufficient documentation

## 2014-10-22 DIAGNOSIS — A630 Anogenital (venereal) warts: Secondary | ICD-10-CM

## 2014-10-22 DIAGNOSIS — B370 Candidal stomatitis: Secondary | ICD-10-CM

## 2014-10-22 DIAGNOSIS — B3781 Candidal esophagitis: Secondary | ICD-10-CM

## 2014-10-22 MED ORDER — SULFAMETHOXAZOLE-TRIMETHOPRIM 800-160 MG PO TABS: 1.0000 | ORAL_TABLET | ORAL | Status: DC

## 2014-10-22 NOTE — Assessment & Plan Note (Signed)
Resolved

## 2014-10-22 NOTE — Progress Notes (Signed)
   Subjective:    Patient ID: Casey Hurley, male    DOB: 01-30-1971, 44 y.o.   MRN: [id number]  HPI 44 yo M with recently dx AIDS/PCP, adm to [facility] 2-28 to 3-4. Was started on genvoya in hospital. No problems with this. Has had some ankle pain. Has been doing house work, going fishing.  Was seen in ID 3-8 for thrush, rx for 15 days.  Will change to bactrim qday on 3-21. Has completed prednisone, for last week.   ART is going well.  CD4 has gone from 20 --> 30.  Ginette Pitmanhrush is resolved.  Now having worsening anal warts.   HIV 1 RNA QUANT (copies/mL)  Date Value  10/08/2014 42*  08/27/2014 80520  08/24/2014 QUANTITY NOT SUFFICIENT, UNABLE TO PERFORM TEST   CD4 T CELL ABS (/uL)  Date Value  10/08/2014 30*   Review of Systems  Constitutional: Negative for fever, chills, appetite change and unexpected weight change.  Gastrointestinal: Negative for diarrhea and constipation.  Genitourinary: Negative for difficulty urinating.      Objective:   Physical Exam  Constitutional: He appears well-developed and well-nourished.  HENT:  Mouth/Throat: No oropharyngeal exudate.  Eyes: EOM are normal. Pupils are equal, round, and reactive to light.  Neck: Neck supple.  Cardiovascular: Normal rate, regular rhythm and normal heart sounds.   Pulmonary/Chest: Effort normal and breath sounds normal.  Abdominal: Soft. Bowel sounds are normal. He exhibits no distension. There is no tenderness.  Genitourinary:     Lymphadenopathy:    He has no cervical adenopathy.      Assessment & Plan:

## 2014-10-22 NOTE — Assessment & Plan Note (Signed)
Will get him into [facility] clinic in 3-4 months.

## 2014-10-22 NOTE — Assessment & Plan Note (Signed)
He is doing ok with ART.  Await CD4 to come up.  Will change his bactrim to TIW.   Will retest his wife at f/u appt.  Send her to [facility] (states she had ADR to TRV?)

## 2014-10-22 NOTE — Assessment & Plan Note (Signed)
Hopefully will improve with CD4 improving.

## 2015-01-08 ENCOUNTER — Other Ambulatory Visit: Payer: Self-pay | Admitting: Infectious Diseases

## 2015-02-08 ENCOUNTER — Other Ambulatory Visit: Payer: BC Managed Care – PPO

## 2015-02-08 DIAGNOSIS — B20 Human immunodeficiency virus [HIV] disease: Secondary | ICD-10-CM

## 2015-02-09 LAB — T-HELPER CELL (CD4) - (RCID CLINIC ONLY)
CD4 % Helper T Cell: 3 % — ABNORMAL LOW (ref 33–55)
CD4 T Cell Abs: 60 /uL — ABNORMAL LOW (ref 400–2700)

## 2015-02-10 LAB — HIV-1 RNA QUANT-NO REFLEX-BLD: HIV-1 RNA Quant, Log: <1.3 {Log} (ref <1.30)

## 2015-02-22 ENCOUNTER — Encounter: Payer: Self-pay | Admitting: Infectious Diseases

## 2015-02-22 ENCOUNTER — Ambulatory Visit (INDEPENDENT_AMBULATORY_CARE_PROVIDER_SITE_OTHER): Payer: BC Managed Care – PPO | Admitting: Infectious Diseases

## 2015-02-22 ENCOUNTER — Telehealth: Payer: Self-pay | Admitting: *Deleted

## 2015-02-22 VITALS — BP 120/85 | HR 83 | Temp 98.3°F | Ht 71.0 in | Wt 135.0 lb

## 2015-02-22 DIAGNOSIS — N179 Acute kidney failure, unspecified: Secondary | ICD-10-CM | POA: Diagnosis not present

## 2015-02-22 DIAGNOSIS — B20 Human immunodeficiency virus [HIV] disease: Secondary | ICD-10-CM

## 2015-02-22 DIAGNOSIS — Z79899 Other long term (current) drug therapy: Secondary | ICD-10-CM

## 2015-02-22 DIAGNOSIS — A630 Anogenital (venereal) warts: Secondary | ICD-10-CM

## 2015-02-22 DIAGNOSIS — Z113 Encounter for screening for infections with a predominantly sexual mode of transmission: Secondary | ICD-10-CM

## 2015-02-22 DIAGNOSIS — Z720 Tobacco use: Secondary | ICD-10-CM

## 2015-02-22 DIAGNOSIS — F17200 Nicotine dependence, unspecified, uncomplicated: Secondary | ICD-10-CM | POA: Insufficient documentation

## 2015-02-22 DIAGNOSIS — Z23 Encounter for immunization: Secondary | ICD-10-CM | POA: Diagnosis not present

## 2015-02-22 LAB — BASIC METABOLIC PANEL
BUN: 12 mg/dL (ref 7–25)
CO2: 29 mmol/L (ref 20–31)
Calcium: 9.7 mg/dL (ref 8.6–10.3)
Chloride: 101 mmol/L (ref 98–110)
Creat: 1.08 mg/dL (ref 0.60–1.35)
Glucose, Bld: 89 mg/dL (ref 65–99)
POTASSIUM: 4.5 mmol/L (ref 3.5–5.3)
SODIUM: 137 mmol/L (ref 135–146)

## 2015-02-22 MED ORDER — DRONABINOL 5 MG PO CAPS: 5.0000 mg | ORAL_CAPSULE | Freq: Two times a day (BID) | ORAL | Status: DC

## 2015-02-22 NOTE — Assessment & Plan Note (Signed)
Encouraged him to quit. His wife is persistent on this.

## 2015-02-22 NOTE — Assessment & Plan Note (Addendum)
Had condom come off twice, counseled his wife (who did not tolerate PREP), that their risk is very low.  Has had pnvx and flu shot last spring. Needs flu today  Given condoms.  Will consider changing his ART if his Cr is not improved.  Stop azithro Will give him marinol to try to gain wt.  rtc 6 months. If labs ok.

## 2015-02-22 NOTE — Progress Notes (Signed)
   Subjective:    Patient ID: Casey Hurley, male    DOB: 19-Jan-1971, 44 y.o.   MRN: [id number]  HPI  44 yo M with recently dx AIDS/PCP, adm to [facility] 2-28 to 3-4. Was started on genvoya in hospital. Has been having difficulty with azithro causing severe diarrhea (3h) after he takes his dose of this.  Today complains of feeling sluggish.  Taking bactrim MWF. Taking genvoya at AM.  His most recent Cr (7-21) was 1.32.  Restarted smoking.   HIV 1 RNA QUANT (copies/mL)  Date Value  02/08/2015 <20  10/08/2014 42*  08/27/2014 80520   CD4 T CELL ABS (/uL)  Date Value  02/08/2015 60*  10/08/2014 30*    Review of Systems  Constitutional: Negative for fever, chills, appetite change and unexpected weight change.  Respiratory: Negative for cough.   Gastrointestinal: Positive for diarrhea. Negative for constipation.  Genitourinary: Negative for difficulty urinating.       Objective:   Physical Exam  Constitutional: He appears well-developed and well-nourished.  HENT:  Mouth/Throat: No oropharyngeal exudate.  Eyes: EOM are normal. Pupils are equal, round, and reactive to light.  Neck: Neck supple.  Cardiovascular: Normal rate, regular rhythm and normal heart sounds.   Pulmonary/Chest: Effort normal and breath sounds normal.  Abdominal: Soft. Bowel sounds are normal. There is no tenderness.  Musculoskeletal: He exhibits no edema.  Lymphadenopathy:    He has no cervical adenopathy.       Assessment & Plan:

## 2015-02-22 NOTE — Telephone Encounter (Signed)
Left patient a voice mail to call the clinic and speak with a schedule to be added to Dr. Moshe Cipro [facility] clinic on 04/09/15. There are morning appointments available. Casey Hurley

## 2015-02-22 NOTE — Assessment & Plan Note (Signed)
Will recheck his Cr.  Consider change to ABC regimen if Cr remains up.

## 2015-02-22 NOTE — Assessment & Plan Note (Signed)
Will get him set up for HRA next month.

## 2015-03-22 ENCOUNTER — Other Ambulatory Visit: Payer: Self-pay | Admitting: Internal Medicine

## 2015-03-23 ENCOUNTER — Other Ambulatory Visit: Payer: Self-pay | Admitting: Internal Medicine

## 2015-04-07 ENCOUNTER — Other Ambulatory Visit: Payer: Self-pay | Admitting: *Deleted

## 2015-04-07 DIAGNOSIS — B20 Human immunodeficiency virus [HIV] disease: Secondary | ICD-10-CM

## 2015-04-07 MED ORDER — ELVITEG-COBIC-EMTRICIT-TENOFAF 150-150-200-10 MG PO TABS: 1.0000 | ORAL_TABLET | Freq: Every day | ORAL | Status: DC

## 2015-07-20 ENCOUNTER — Other Ambulatory Visit: Payer: Self-pay | Admitting: Infectious Diseases

## 2015-07-20 DIAGNOSIS — B20 Human immunodeficiency virus [HIV] disease: Secondary | ICD-10-CM

## 2015-08-09 ENCOUNTER — Other Ambulatory Visit: Payer: BC Managed Care – PPO

## 2015-08-09 DIAGNOSIS — Z79899 Other long term (current) drug therapy: Secondary | ICD-10-CM

## 2015-08-09 DIAGNOSIS — B20 Human immunodeficiency virus [HIV] disease: Secondary | ICD-10-CM

## 2015-08-09 DIAGNOSIS — Z113 Encounter for screening for infections with a predominantly sexual mode of transmission: Secondary | ICD-10-CM

## 2015-08-09 LAB — CBC
HCT: 44.7 % (ref 39.0–52.0)
Hemoglobin: 15.5 g/dL (ref 13.0–17.0)
MCH: 35.4 pg — ABNORMAL HIGH (ref 26.0–34.0)
MCHC: 34.7 g/dL (ref 30.0–36.0)
MCV: 102.1 fL — AB (ref 78.0–100.0)
MPV: 9.8 fL (ref 8.6–12.4)
PLATELETS: 231 10*3/uL (ref 150–400)
RBC: 4.38 MIL/uL (ref 4.22–5.81)
RDW: 13.5 % (ref 11.5–15.5)
WBC: 6.7 10*3/uL (ref 4.0–10.5)

## 2015-08-09 LAB — COMPREHENSIVE METABOLIC PANEL
ALBUMIN: 4.2 g/dL (ref 3.6–5.1)
ALT: 11 U/L (ref 9–46)
AST: 19 U/L (ref 10–40)
Alkaline Phosphatase: 90 U/L (ref 40–115)
BILIRUBIN TOTAL: 0.5 mg/dL (ref 0.2–1.2)
BUN: 14 mg/dL (ref 7–25)
CALCIUM: 9.3 mg/dL (ref 8.6–10.3)
CHLORIDE: 104 mmol/L (ref 98–110)
CO2: 29 mmol/L (ref 20–31)
Creat: 1.3 mg/dL (ref 0.60–1.35)
Glucose, Bld: 84 mg/dL (ref 65–99)
Potassium: 4.2 mmol/L (ref 3.5–5.3)
Sodium: 139 mmol/L (ref 135–146)
Total Protein: 7.1 g/dL (ref 6.1–8.1)

## 2015-08-09 LAB — LIPID PANEL
Cholesterol: 171 mg/dL (ref 125–200)
HDL: 40 mg/dL (ref >=40)
LDL CALC: 98 mg/dL (ref <130)
TRIGLYCERIDES: 167 mg/dL — AB (ref <150)
Total CHOL/HDL Ratio: 4.3 Ratio (ref <=5.0)
VLDL: 33 mg/dL — ABNORMAL HIGH (ref <30)

## 2015-08-10 LAB — RPR

## 2015-08-11 LAB — HIV-1 RNA QUANT-NO REFLEX-BLD
HIV 1 RNA Quant: <20 copies/mL (ref <20)
HIV-1 RNA Quant, Log: <1.3 Log copies/mL (ref <1.30)

## 2015-08-12 LAB — T-HELPER CELL (CD4) - (RCID CLINIC ONLY)
CD4 % Helper T Cell: 5 % — ABNORMAL LOW (ref 33–55)
CD4 T CELL ABS: 110 /uL — AB (ref 400–2700)

## 2015-08-23 ENCOUNTER — Encounter: Payer: Self-pay | Admitting: Infectious Diseases

## 2015-08-23 ENCOUNTER — Ambulatory Visit (INDEPENDENT_AMBULATORY_CARE_PROVIDER_SITE_OTHER): Payer: BC Managed Care – PPO | Admitting: Infectious Diseases

## 2015-08-23 VITALS — BP 134/79 | HR 80 | Temp 98.0°F | Ht 70.0 in | Wt 143.0 lb

## 2015-08-23 DIAGNOSIS — B59 Pneumocystosis: Secondary | ICD-10-CM | POA: Diagnosis not present

## 2015-08-23 DIAGNOSIS — R748 Abnormal levels of other serum enzymes: Secondary | ICD-10-CM

## 2015-08-23 DIAGNOSIS — G4700 Insomnia, unspecified: Secondary | ICD-10-CM

## 2015-08-23 DIAGNOSIS — F17200 Nicotine dependence, unspecified, uncomplicated: Secondary | ICD-10-CM | POA: Diagnosis not present

## 2015-08-23 DIAGNOSIS — R7989 Other specified abnormal findings of blood chemistry: Secondary | ICD-10-CM | POA: Insufficient documentation

## 2015-08-23 DIAGNOSIS — B20 Human immunodeficiency virus [HIV] disease: Secondary | ICD-10-CM

## 2015-08-23 DIAGNOSIS — Z79899 Other long term (current) drug therapy: Secondary | ICD-10-CM

## 2015-08-23 DIAGNOSIS — Z113 Encounter for screening for infections with a predominantly sexual mode of transmission: Secondary | ICD-10-CM

## 2015-08-23 MED ORDER — SULFAMETHOXAZOLE-TRIMETHOPRIM 800-160 MG PO TABS: 1.0000 | ORAL_TABLET | ORAL | Status: DC

## 2015-08-23 MED ORDER — VALACYCLOVIR HCL 500 MG PO TABS: 500.0000 mg | ORAL_TABLET | Freq: Every day | ORAL | Status: DC

## 2015-08-23 MED ORDER — ELVITEG-COBIC-EMTRICIT-TENOFAF 150-150-200-10 MG PO TABS: 1.0000 | ORAL_TABLET | Freq: Every day | ORAL | Status: DC

## 2015-08-23 MED ORDER — TEMAZEPAM 15 MG PO CAPS: 15.0000 mg | ORAL_CAPSULE | Freq: Every evening | ORAL | Status: DC | PRN

## 2015-08-23 MED ORDER — DRONABINOL 5 MG PO CAPS: 5.0000 mg | ORAL_CAPSULE | Freq: Two times a day (BID) | ORAL | Status: DC

## 2015-08-23 MED ORDER — FLUCONAZOLE 200 MG PO TABS: 200.0000 mg | ORAL_TABLET | Freq: Every day | ORAL | Status: DC

## 2015-08-23 NOTE — Progress Notes (Signed)
   Subjective:    Patient ID: Casey Hurley, male    DOB: March 12, 1971, 45 y.o.   MRN: [id number]  HPI 45 yo M with recently dx AIDS/PCP, adm to [facility] 2-28 to 3-4. Was started on genvoya in hospital.  HIV 1 RNA QUANT (copies/mL)  Date Value  08/09/2015 <20  02/08/2015 <20  10/08/2014 42*   CD4 T CELL ABS (/uL)  Date Value  08/10/2015 110*  02/08/2015 60*  10/08/2014 30*   Lab Results  Component Value Date   CHOL 171 08/09/2015   HDL 40 08/09/2015   LDLCALC 98 08/09/2015   TRIG 167* 08/09/2015   CHOLHDL 4.3 08/09/2015     Has been feeling well.  No problems with genvoya.  Takes marinol tiw. Takes occas flucon.  Having issues with fatigue. Would like B12 and testosterone checked.  Still smoking-  Wife tested today, negative.   Review of Systems  Constitutional: Negative for appetite change and unexpected weight change.  Respiratory: Negative for cough.   Gastrointestinal: Negative for diarrhea and constipation.  Genitourinary: Negative for difficulty urinating.       Objective:   Physical Exam  Constitutional: He appears well-developed and well-nourished.  HENT:  Mouth/Throat: No oropharyngeal exudate.  Eyes: EOM are normal. Pupils are equal, round, and reactive to light.  Neck: Neck supple.  Cardiovascular: Normal rate, regular rhythm and normal heart sounds.   Pulmonary/Chest: Effort normal and breath sounds normal.  Abdominal: Soft. Bowel sounds are normal. There is no tenderness. There is no rebound.  Musculoskeletal: He exhibits no edema.  Lymphadenopathy:    He has no cervical adenopathy.      Assessment & Plan:

## 2015-08-23 NOTE — Assessment & Plan Note (Signed)
Suspect due to cobicistat. Could be due to bactrim. Will watch.

## 2015-08-23 NOTE — Assessment & Plan Note (Signed)
Encouraged to quit. 

## 2015-08-23 NOTE — Assessment & Plan Note (Signed)
Will continue his genvoya They have condoms vax are up to date Encouraged them to enjoy their upcoming cruise. Drink only bottle water when off ship.  Will see him back in 6 months

## 2015-08-23 NOTE — Assessment & Plan Note (Signed)
Refill bactrim doing well

## 2015-08-24 ENCOUNTER — Other Ambulatory Visit: Payer: BC Managed Care – PPO

## 2015-08-24 DIAGNOSIS — I10 Essential (primary) hypertension: Secondary | ICD-10-CM

## 2015-08-24 DIAGNOSIS — B20 Human immunodeficiency virus [HIV] disease: Secondary | ICD-10-CM

## 2015-08-24 LAB — VITAMIN B12: Vitamin B-12: 423 pg/mL (ref 200–1100)

## 2015-08-24 LAB — TSH: TSH: 1.92 m[IU]/L (ref 0.40–4.50)

## 2015-08-25 LAB — URINE CYTOLOGY ANCILLARY ONLY
Chlamydia: NEGATIVE
NEISSERIA GONORRHEA: NEGATIVE

## 2015-08-25 LAB — TESTOSTERONE: Testosterone: 545 ng/dL (ref 250–827)

## 2015-10-11 ENCOUNTER — Other Ambulatory Visit: Payer: Self-pay | Admitting: Infectious Diseases

## 2015-10-26 ENCOUNTER — Telehealth: Payer: Self-pay | Admitting: *Deleted

## 2015-10-26 NOTE — Telephone Encounter (Signed)
Patient was given restoril at his last visit. He has tried this 5-6 times, had vivid nightmares each time. He would prefer to restart ativan (was xanax per chart review). Please advise. Andree CossHowell, Kameryn Tisdel M, RN

## 2015-11-17 NOTE — Telephone Encounter (Signed)
Patient will talk to his pcp, declines restoril and ambien because of side effects the next day.

## 2015-11-17 NOTE — Telephone Encounter (Signed)
We can write restoril or ambien as needed

## 2015-11-17 NOTE — Telephone Encounter (Addendum)
Patient calling back to see if Dr. Ninetta LightsHatcher will write rx for ativan to help him sleep as needed.  Per patient, xanax gives patient a "bad hangover" and he has nightmares with restoril. Andree CossHowell, Michelle M, RN

## 2016-01-20 ENCOUNTER — Other Ambulatory Visit: Payer: BC Managed Care – PPO

## 2016-01-20 DIAGNOSIS — B20 Human immunodeficiency virus [HIV] disease: Secondary | ICD-10-CM

## 2016-01-20 DIAGNOSIS — Z113 Encounter for screening for infections with a predominantly sexual mode of transmission: Secondary | ICD-10-CM

## 2016-01-20 LAB — CBC
HEMATOCRIT: 43.4 % (ref 38.5–50.0)
Hemoglobin: 15.4 g/dL (ref 13.2–17.1)
MCH: 36.8 pg — ABNORMAL HIGH (ref 27.0–33.0)
MCHC: 35.5 g/dL (ref 32.0–36.0)
MCV: 103.8 fL — AB (ref 80.0–100.0)
MPV: 9.7 fL (ref 7.5–12.5)
Platelets: 210 10*3/uL (ref 140–400)
RBC: 4.18 MIL/uL — AB (ref 4.20–5.80)
RDW: 13.8 % (ref 11.0–15.0)
WBC: 5.3 10*3/uL (ref 3.8–10.8)

## 2016-01-20 LAB — COMPREHENSIVE METABOLIC PANEL
ALK PHOS: 88 U/L (ref 40–115)
ALT: 9 U/L (ref 9–46)
AST: 16 U/L (ref 10–40)
Albumin: 4.1 g/dL (ref 3.6–5.1)
BILIRUBIN TOTAL: 0.5 mg/dL (ref 0.2–1.2)
BUN: 15 mg/dL (ref 7–25)
CALCIUM: 9.1 mg/dL (ref 8.6–10.3)
CO2: 25 mmol/L (ref 20–31)
Chloride: 105 mmol/L (ref 98–110)
Creat: 1.59 mg/dL — ABNORMAL HIGH (ref 0.60–1.35)
GLUCOSE: 115 mg/dL — AB (ref 65–99)
POTASSIUM: 4.5 mmol/L (ref 3.5–5.3)
Sodium: 139 mmol/L (ref 135–146)
TOTAL PROTEIN: 6.8 g/dL (ref 6.1–8.1)

## 2016-01-20 NOTE — Addendum Note (Signed)
Addended by: Mariea Clonts D on: 01/20/2016 04:15 PM   Modules accepted: Orders

## 2016-01-21 LAB — T-HELPER CELL (CD4) - (RCID CLINIC ONLY)
CD4 % Helper T Cell: 6 % — ABNORMAL LOW (ref 33–55)
CD4 T Cell Abs: 100 /uL — ABNORMAL LOW (ref 400–2700)

## 2016-01-24 LAB — HIV-1 RNA QUANT-NO REFLEX-BLD
HIV 1 RNA Quant: <20 copies/mL (ref <20)
HIV-1 RNA Quant, Log: <1.3 Log copies/mL (ref <1.30)

## 2016-01-24 LAB — URINE CYTOLOGY ANCILLARY ONLY
CHLAMYDIA, DNA PROBE: NEGATIVE
NEISSERIA GONORRHEA: NEGATIVE

## 2016-01-31 ENCOUNTER — Ambulatory Visit (INDEPENDENT_AMBULATORY_CARE_PROVIDER_SITE_OTHER): Payer: BC Managed Care – PPO | Admitting: Infectious Diseases

## 2016-01-31 ENCOUNTER — Encounter: Payer: Self-pay | Admitting: Infectious Diseases

## 2016-01-31 VITALS — BP 118/85 | HR 81 | Temp 98.0°F | Ht 71.0 in | Wt 136.5 lb

## 2016-01-31 DIAGNOSIS — B20 Human immunodeficiency virus [HIV] disease: Secondary | ICD-10-CM | POA: Diagnosis not present

## 2016-01-31 DIAGNOSIS — N179 Acute kidney failure, unspecified: Secondary | ICD-10-CM

## 2016-01-31 DIAGNOSIS — B59 Pneumocystosis: Secondary | ICD-10-CM

## 2016-01-31 DIAGNOSIS — Z113 Encounter for screening for infections with a predominantly sexual mode of transmission: Secondary | ICD-10-CM

## 2016-01-31 DIAGNOSIS — F17200 Nicotine dependence, unspecified, uncomplicated: Secondary | ICD-10-CM

## 2016-01-31 DIAGNOSIS — F1020 Alcohol dependence, uncomplicated: Secondary | ICD-10-CM

## 2016-01-31 DIAGNOSIS — Z79899 Other long term (current) drug therapy: Secondary | ICD-10-CM

## 2016-01-31 NOTE — Assessment & Plan Note (Signed)
He is doing well.  Will stop his bactrim (off guidelines) due to increase in Cr.  Have him back in 1 month for repeat lab. F/u appt determined by his Cr.

## 2016-01-31 NOTE — Assessment & Plan Note (Signed)
Will stop his bactrim (off guidelines) due to increase in Cr.  Have him back in 1 month for repeat lab. F/u appt determined by his Cr.

## 2016-01-31 NOTE — Assessment & Plan Note (Addendum)
He has incomoplete immune recontstitution.  Will cont his genvoya.  May need to change due to his Cr.  Partner testing today Will see him back  Depending on his Cr level at 1 month f/u.

## 2016-01-31 NOTE — Assessment & Plan Note (Signed)
He is aware he needs to cut back.  He is counseled on this.  Offered him to speak with Lennox LaityJodi, he defers.

## 2016-01-31 NOTE — Progress Notes (Signed)
   Subjective:    Patient ID: Casey Hurley, male    DOB: [birth year], 45 y.o.   MRN: [id number]  HPI 45 yo M with recently dx AIDS/PCP, adm to [facility] 2-28 to 08-28-14. Was started on genvoya in hospital.  HIV 1 RNA Quant (copies/mL)  Date Value  01/20/2016 <20  08/09/2015 <20  02/08/2015 <20   CD4 T Cell Abs (/uL)  Date Value  01/20/2016 100 (L)  08/10/2015 110 (L)  02/08/2015 60 (L)   Lab Results  Component Value Date   CREATININE 1.59 (H) 01/20/2016   CREATININE 1.30 08/09/2015   CREATININE 1.08 02/22/2015   Has been feeling well. Has questions about his Cr and his CD4, VL.  Has been drinking a case of beer/weekend. Hard licqour as well.  Cage- 1 (felt need to cut back).  Smoking 1 ppd. Is going to quit when he goes on vacation.  Taking marinol intermittently.  restoril has given him nightmares. Got ativan from PCP.   Review of Systems  Constitutional: Negative for appetite change and unexpected weight change.  HENT: Negative for trouble swallowing.   Respiratory: Positive for cough. Negative for shortness of breath.   Cardiovascular: Negative for leg swelling.  Endocrine: Negative for polyuria.  Musculoskeletal: Negative for back pain.  occas cough, occas hoarseness.     Objective:   Physical Exam  Constitutional: He appears well-developed and well-nourished.  HENT:  Mouth/Throat: No oropharyngeal exudate.  Eyes: EOM are normal. Pupils are equal, round, and reactive to light.  Neck: Neck supple.  Cardiovascular: Normal rate, regular rhythm and normal heart sounds.   Pulmonary/Chest: Effort normal and breath sounds normal.  Abdominal: Soft. Bowel sounds are normal. There is no tenderness. There is no rebound.  Musculoskeletal: He exhibits no edema.  Lymphadenopathy:    He has no cervical adenopathy.          Assessment & Plan:

## 2016-01-31 NOTE — Assessment & Plan Note (Signed)
He is aware he needs to cut back.  He is counseled on this.

## 2016-02-02 ENCOUNTER — Telehealth: Payer: Self-pay | Admitting: *Deleted

## 2016-02-02 NOTE — Telephone Encounter (Signed)
Patient called asking for advice. He states his wife just got a call letting her know that she is positive for c diff, will start treatment today. Patient denies any changes in bowel movements (never solid, but not any different than they have been since he started HIV treatment).  Per Dr. Johnnye Sima, patient educated regarding proper hand hygiene (soap and water), using separate restrooms, and using bleach to clean bathrooms.  Patient will call if he experiences any change in bowel patterns.  Patient especially concerned, as he is traveling to [place] in 3 weeks, does not want to have this untreated in the mean time.  He will come by for a stool kit if he experiences any symptoms. Landis Gandy, RN

## 2016-03-06 ENCOUNTER — Other Ambulatory Visit: Payer: BC Managed Care – PPO

## 2016-03-06 ENCOUNTER — Ambulatory Visit: Payer: BC Managed Care – PPO

## 2016-03-06 ENCOUNTER — Other Ambulatory Visit: Payer: Self-pay | Admitting: Infectious Diseases

## 2016-03-06 DIAGNOSIS — Z79899 Other long term (current) drug therapy: Secondary | ICD-10-CM

## 2016-03-06 DIAGNOSIS — B20 Human immunodeficiency virus [HIV] disease: Secondary | ICD-10-CM

## 2016-03-06 DIAGNOSIS — Z113 Encounter for screening for infections with a predominantly sexual mode of transmission: Secondary | ICD-10-CM

## 2016-03-06 DIAGNOSIS — N179 Acute kidney failure, unspecified: Secondary | ICD-10-CM

## 2016-03-06 NOTE — Addendum Note (Signed)
Addended by: Mariea ClontsGREEN, Evanny Ellerbe D on: 03/06/2016 04:38 PM   Modules accepted: Orders

## 2016-03-07 LAB — COMPREHENSIVE METABOLIC PANEL
ALT: 12 U/L (ref 9–46)
AST: 16 U/L (ref 10–40)
Albumin: 4.4 g/dL (ref 3.6–5.1)
Alkaline Phosphatase: 88 U/L (ref 40–115)
BUN: 16 mg/dL (ref 7–25)
CHLORIDE: 102 mmol/L (ref 98–110)
CO2: 24 mmol/L (ref 20–31)
CREATININE: 1.14 mg/dL (ref 0.60–1.35)
Calcium: 9.8 mg/dL (ref 8.6–10.3)
GLUCOSE: 98 mg/dL (ref 65–99)
POTASSIUM: 4.5 mmol/L (ref 3.5–5.3)
SODIUM: 137 mmol/L (ref 135–146)
Total Bilirubin: 0.5 mg/dL (ref 0.2–1.2)
Total Protein: 7.4 g/dL (ref 6.1–8.1)

## 2016-05-12 ENCOUNTER — Encounter (HOSPITAL_BASED_OUTPATIENT_CLINIC_OR_DEPARTMENT_OTHER): Payer: Self-pay

## 2016-05-12 ENCOUNTER — Emergency Department (HOSPITAL_BASED_OUTPATIENT_CLINIC_OR_DEPARTMENT_OTHER): Payer: BC Managed Care – PPO

## 2016-05-12 ENCOUNTER — Emergency Department (HOSPITAL_BASED_OUTPATIENT_CLINIC_OR_DEPARTMENT_OTHER)
Admission: EM | Admit: 2016-05-12 | Discharge: 2016-05-12 | Disposition: A | Discharge status: Home / Self Care | Payer: BC Managed Care – PPO | Attending: Emergency Medicine | Admitting: Emergency Medicine

## 2016-05-12 DIAGNOSIS — I10 Essential (primary) hypertension: Secondary | ICD-10-CM | POA: Diagnosis not present

## 2016-05-12 DIAGNOSIS — Y939 Activity, unspecified: Secondary | ICD-10-CM | POA: Diagnosis not present

## 2016-05-12 DIAGNOSIS — S86911A Strain of unspecified muscle(s) and tendon(s) at lower leg level, right leg, initial encounter: Secondary | ICD-10-CM | POA: Diagnosis not present

## 2016-05-12 DIAGNOSIS — Y929 Unspecified place or not applicable: Secondary | ICD-10-CM | POA: Insufficient documentation

## 2016-05-12 DIAGNOSIS — B20 Human immunodeficiency virus [HIV] disease: Secondary | ICD-10-CM | POA: Diagnosis not present

## 2016-05-12 DIAGNOSIS — M25461 Effusion, right knee: Secondary | ICD-10-CM | POA: Diagnosis not present

## 2016-05-12 DIAGNOSIS — F17210 Nicotine dependence, cigarettes, uncomplicated: Secondary | ICD-10-CM | POA: Insufficient documentation

## 2016-05-12 DIAGNOSIS — W19XXXA Unspecified fall, initial encounter: Secondary | ICD-10-CM | POA: Insufficient documentation

## 2016-05-12 DIAGNOSIS — Y999 Unspecified external cause status: Secondary | ICD-10-CM | POA: Diagnosis not present

## 2016-05-12 DIAGNOSIS — S8991XA Unspecified injury of right lower leg, initial encounter: Secondary | ICD-10-CM | POA: Diagnosis present

## 2016-05-12 HISTORY — DX: Asymptomatic human immunodeficiency virus (hiv) infection status: Z21

## 2016-05-12 HISTORY — DX: Human immunodeficiency virus (HIV) disease: B20

## 2016-05-12 MED ORDER — DICLOFENAC SODIUM ER 100 MG PO TB24: 100.0000 mg | ORAL_TABLET | Freq: Every day | ORAL | 0 refills | Status: DC

## 2016-05-12 MED ORDER — KETOROLAC TROMETHAMINE 60 MG/2ML IM SOLN
60.0000 mg | Freq: Once | INTRAMUSCULAR | Status: AC
  Administered 2016-05-12: 60 mg via INTRAMUSCULAR
  Filled 2016-05-12: qty 2

## 2016-05-12 NOTE — ED Notes (Signed)
Pt to xray via stretcher, alert, NAD, calm, interactive.  ?

## 2016-05-12 NOTE — ED Triage Notes (Signed)
Pt states fell last night landing on rt knee, swelling and heat noted; states unable to bare weight

## 2016-05-12 NOTE — ED Notes (Signed)
Back from xray, no changes.  ?

## 2016-05-12 NOTE — ED Provider Notes (Signed)
[facility]   CSN: [id number] Arrival date & time: 05/12/16  7829     History   Chief Complaint Chief Complaint  Patient presents with  . Knee Pain    HPI Casey Hurley is a 45 y.o. male.  The history is provided by the patient.  Knee Pain   This is a new problem. The current episode started 3 to 5 hours ago. The problem occurs constantly. The problem has not changed since onset.The pain is present in the right knee. The quality of the pain is described as constant. The pain is severe. Pertinent negatives include no numbness and no itching. The symptoms are aggravated by activity. Treatments tried: ice. The treatment provided no relief. There has been a history of trauma (fell onto knee [place] a [place]). Family history is significant for no rheumatoid arthritis.    Past Medical History:  Diagnosis Date  . Acquired immune deficiency syndrome (HCC)   . Genital herpes   . HIV (human immunodeficiency virus infection) (HCC)   . Hypertension   . PCP (pneumocystis carinii pneumonia) [facility])     Patient Active Problem List   Diagnosis Date Noted  . Alcoholism (HCC) 01/31/2016  . Acute renal failure (HCC) 02/22/2015  . Tobacco use disorder 02/22/2015  . Molluscum contagiosum infection 10/22/2014  . Hyperglycemia 09/11/2014  . PCP (pneumocystis carinii pneumonia) (HCC)   . AIDS (HCC) 08/25/2014  . Tachycardia   . Essential hypertension 08/24/2014  . Genital herpes 08/24/2014  . Recurrent pneumonia 08/23/2014  . Post-operative state 12/11/2013  . Anal condyloma 11/10/2013  . Breast abscess in male 07/08/2012    Past Surgical History:  Procedure Laterality Date  . ANAL EXAMINATION UNDER ANESTHESIA     anal condylomas  . VIDEO BRONCHOSCOPY Bilateral 08/25/2014   Procedure: VIDEO BRONCHOSCOPY WITHOUT FLUORO;  Surgeon: Leslye Peer, MD;  Location: [facility];  Service: Cardiopulmonary;  Laterality: Bilateral;       Home Medications    Prior to  Admission medications   Medication Sig Start Date End Date Taking? Authorizing Provider  fluconazole (DIFLUCAN) 200 MG tablet Take 1 tablet (200 mg total) by mouth daily. 08/23/15   Ginnie Smart, MD  GENVOYA 150-150-200-10 MG TABS tablet TAKE 1 TABLET BY MOUTH EVERY DAY WITH BREAKFAST 10/11/15   Ginnie Smart, MD  valACYclovir (VALTREX) 500 MG tablet Take 1 tablet (500 mg total) by mouth daily. 08/23/15   Ginnie Smart, MD    Family History Family History  Problem Relation Age of Onset  . Prostate cancer Father   . Breast cancer      Social History Social History  Substance Use Topics  . Smoking status: Current Every Day Smoker    Packs/day: 1.00    Types: Cigarettes    Start date: 06/02/2014  . Smokeless tobacco: Never Used     Comment: has a quit date, 3 weeks  . Alcohol use 3.6 oz/week    6 Cans of beer per week     Allergies   Restoril [temazepam]   Review of Systems Review of Systems  Eyes: Negative for visual disturbance.  Musculoskeletal: Positive for arthralgias. Negative for back pain, neck pain and neck stiffness.  Skin: Negative for itching.  Neurological: Negative for numbness.  All other systems reviewed and are negative.    Physical Exam Updated Vital Signs BP 115/88 (BP Location: Right Arm)   Pulse 103   Temp 97.9 F (36.6 C) (Oral)   Resp  14   Ht 5\' 11"  (1.803 m)   Wt 140 lb (63.5 kg)   SpO2 100%   BMI 19.53 kg/m   Physical Exam  Constitutional: He is oriented to person, place, and time. He appears well-developed and well-nourished. No distress.  HENT:  Head: Normocephalic and atraumatic.  Eyes: Pupils are equal, round, and reactive to light.  Neck: Normal range of motion. Neck supple.  Cardiovascular: Normal rate, regular rhythm and intact distal pulses.   Pulmonary/Chest: Effort normal and breath sounds normal.  Abdominal: Soft. Bowel sounds are normal. He exhibits no mass. There is no tenderness. There is no rebound and no  guarding.  Musculoskeletal: Normal range of motion. He exhibits no edema.       Right knee: He exhibits normal range of motion, no swelling, no ecchymosis, no deformity, no laceration, no erythema, normal alignment, no LCL laxity, normal patellar mobility, no bony tenderness, normal meniscus and no MCL laxity. No tenderness found. No medial joint line, no lateral joint line, no MCL, no LCL and no patellar tendon tenderness noted.       Right ankle: Normal. Achilles tendon normal.       Right upper leg: Normal.       Right lower leg: Normal.       Right foot: Normal.  Neurological: He is alert and oriented to person, place, and time.  Skin: Skin is warm and dry. Capillary refill takes less than 2 seconds.  Psychiatric: He has a normal mood and affect.     ED Treatments / Results   Vitals:   05/12/16 0344  BP: 115/88  Pulse: 103  Resp: 14  Temp: 97.9 F (36.6 C)   Results for orders placed or performed in visit on 03/06/16  Comprehensive metabolic panel  Result Value Ref Range   Sodium 137 135 - 146 mmol/L   Potassium 4.5 3.5 - 5.3 mmol/L   Chloride 102 98 - 110 mmol/L   CO2 24 20 - 31 mmol/L   Glucose, Bld 98 65 - 99 mg/dL   BUN 16 7 - 25 mg/dL   Creat 1.611.14 0.960.60 - 0.451.35 mg/dL   Total Bilirubin 0.5 0.2 - 1.2 mg/dL   Alkaline Phosphatase 88 40 - 115 U/L   AST 16 10 - 40 U/L   ALT 12 9 - 46 U/L   Total Protein 7.4 6.1 - 8.1 g/dL   Albumin 4.4 3.6 - 5.1 g/dL   Calcium 9.8 8.6 - 40.910.3 mg/dL   Dg Knee Complete 4 Views Right  Result Date: 05/12/2016 CLINICAL DATA:  Initial evaluation for acute right knee pain, recent fall. EXAM: RIGHT KNEE - COMPLETE 4+ VIEW COMPARISON:  None. FINDINGS: No acute fracture or dislocation. Small joint effusion. Minimal degenerative spurring at the patella. Osseous mineralization normal. No soft tissue abnormality. IMPRESSION: 1. No acute fracture or dislocation. 2. Small joint effusion. Electronically Signed   By: Rise MuBenjamin  McClintock M.D.   On:  05/12/2016 04:36    Procedures Procedures (including critical care time)  Medications Ordered in ED Medications  ketorolac (TORADOL) injection 60 mg (not administered)     Final Clinical Impressions(s) / ED Diagnoses  Small traumatic knee effusion: ice elevation NSAIDs and knee sleeve for stability.  Follow up with orthopedics for recheck.  All questions answered to patient's satisfaction. Based on history and exam patient has been appropriately medically screened and emergency conditions excluded. Patient is stable for discharge at this time. Follow up with your PMD for recheck in  2 days and strict return precautions given.     Cy BlamerApril Ulas Zuercher, MD 05/12/16 [phone]

## 2016-08-09 ENCOUNTER — Other Ambulatory Visit (HOSPITAL_COMMUNITY)
Admission: RE | Admit: 2016-08-09 | Discharge: 2016-08-09 | Disposition: A | Discharge status: Home / Self Care | Payer: BC Managed Care – PPO | Source: Ambulatory Visit | Attending: Infectious Diseases | Admitting: Infectious Diseases

## 2016-08-09 ENCOUNTER — Other Ambulatory Visit: Payer: BC Managed Care – PPO

## 2016-08-09 DIAGNOSIS — Z113 Encounter for screening for infections with a predominantly sexual mode of transmission: Secondary | ICD-10-CM | POA: Insufficient documentation

## 2016-08-09 DIAGNOSIS — Z79899 Other long term (current) drug therapy: Secondary | ICD-10-CM

## 2016-08-09 DIAGNOSIS — B20 Human immunodeficiency virus [HIV] disease: Secondary | ICD-10-CM

## 2016-08-09 LAB — CBC WITH DIFFERENTIAL/PLATELET
Basophils Absolute: 0 cells/uL (ref 0–200)
Basophils Relative: 0 %
EOS ABS: 288 {cells}/uL (ref 15–500)
Eosinophils Relative: 4 %
HEMATOCRIT: 44.2 % (ref 38.5–50.0)
Hemoglobin: 15.5 g/dL (ref 13.2–17.1)
LYMPHS PCT: 25 %
Lymphs Abs: 1800 cells/uL (ref 850–3900)
MCH: 36 pg — ABNORMAL HIGH (ref 27.0–33.0)
MCHC: 35.1 g/dL (ref 32.0–36.0)
MCV: 102.6 fL — AB (ref 80.0–100.0)
MONO ABS: 504 {cells}/uL (ref 200–950)
MPV: 9.9 fL (ref 7.5–12.5)
Monocytes Relative: 7 %
NEUTROS PCT: 64 %
Neutro Abs: 4608 cells/uL (ref 1500–7800)
Platelets: 251 10*3/uL (ref 140–400)
RBC: 4.31 MIL/uL (ref 4.20–5.80)
RDW: 13.7 % (ref 11.0–15.0)
WBC: 7.2 10*3/uL (ref 3.8–10.8)

## 2016-08-09 LAB — COMPREHENSIVE METABOLIC PANEL
ALK PHOS: 87 U/L (ref 40–115)
ALT: 10 U/L (ref 9–46)
AST: 18 U/L (ref 10–40)
Albumin: 4.4 g/dL (ref 3.6–5.1)
BUN: 14 mg/dL (ref 7–25)
CALCIUM: 9.6 mg/dL (ref 8.6–10.3)
CO2: 27 mmol/L (ref 20–31)
Chloride: 102 mmol/L (ref 98–110)
Creat: 1.28 mg/dL (ref 0.60–1.35)
GLUCOSE: 77 mg/dL (ref 65–99)
POTASSIUM: 4.6 mmol/L (ref 3.5–5.3)
Sodium: 138 mmol/L (ref 135–146)
Total Bilirubin: 0.4 mg/dL (ref 0.2–1.2)
Total Protein: 7.1 g/dL (ref 6.1–8.1)

## 2016-08-09 LAB — LIPID PANEL
CHOLESTEROL: 178 mg/dL (ref <200)
HDL: 32 mg/dL — ABNORMAL LOW (ref >40)
TRIGLYCERIDES: 453 mg/dL — AB (ref <150)
Total CHOL/HDL Ratio: 5.6 Ratio — ABNORMAL HIGH (ref <5.0)

## 2016-08-10 LAB — RPR

## 2016-08-11 LAB — T-HELPER CELL (CD4) - (RCID CLINIC ONLY)
CD4 T CELL HELPER: 9 % — AB (ref 33–55)
CD4 T Cell Abs: 160 /uL — ABNORMAL LOW (ref 400–2700)

## 2016-08-11 LAB — URINE CYTOLOGY ANCILLARY ONLY
CHLAMYDIA, DNA PROBE: NEGATIVE
Neisseria Gonorrhea: NEGATIVE

## 2016-08-16 LAB — HIV-1 RNA QUANT-NO REFLEX-BLD
HIV 1 RNA Quant: <20 copies/mL
HIV-1 RNA Quant, Log: <1.3 Log copies/mL

## 2016-08-23 ENCOUNTER — Encounter: Payer: Self-pay | Admitting: Infectious Diseases

## 2016-08-23 ENCOUNTER — Ambulatory Visit (INDEPENDENT_AMBULATORY_CARE_PROVIDER_SITE_OTHER): Payer: BC Managed Care – PPO | Admitting: Infectious Diseases

## 2016-08-23 VITALS — BP 117/77 | HR 77 | Temp 98.7°F | Ht 71.0 in | Wt 143.0 lb

## 2016-08-23 DIAGNOSIS — F17200 Nicotine dependence, unspecified, uncomplicated: Secondary | ICD-10-CM

## 2016-08-23 DIAGNOSIS — Z113 Encounter for screening for infections with a predominantly sexual mode of transmission: Secondary | ICD-10-CM | POA: Diagnosis not present

## 2016-08-23 DIAGNOSIS — Z23 Encounter for immunization: Secondary | ICD-10-CM | POA: Diagnosis not present

## 2016-08-23 DIAGNOSIS — I10 Essential (primary) hypertension: Secondary | ICD-10-CM | POA: Diagnosis not present

## 2016-08-23 DIAGNOSIS — N179 Acute kidney failure, unspecified: Secondary | ICD-10-CM

## 2016-08-23 DIAGNOSIS — A6001 Herpesviral infection of penis: Secondary | ICD-10-CM

## 2016-08-23 DIAGNOSIS — F1020 Alcohol dependence, uncomplicated: Secondary | ICD-10-CM | POA: Diagnosis not present

## 2016-08-23 DIAGNOSIS — B20 Human immunodeficiency virus [HIV] disease: Secondary | ICD-10-CM | POA: Diagnosis not present

## 2016-08-23 DIAGNOSIS — Z79899 Other long term (current) drug therapy: Secondary | ICD-10-CM

## 2016-08-23 MED ORDER — FLUCONAZOLE 200 MG PO TABS: 200.0000 mg | ORAL_TABLET | Freq: Every day | ORAL | 5 refills | Status: DC

## 2016-08-23 MED ORDER — VALACYCLOVIR HCL 500 MG PO TABS: 500.0000 mg | ORAL_TABLET | Freq: Every day | ORAL | 3 refills | Status: DC

## 2016-08-23 MED ORDER — DRONABINOL 5 MG PO CAPS: 5.0000 mg | ORAL_CAPSULE | Freq: Two times a day (BID) | ORAL | 3 refills | Status: DC

## 2016-08-23 NOTE — Assessment & Plan Note (Signed)
rx refilled.

## 2016-08-23 NOTE — Assessment & Plan Note (Signed)
Encouraged him to quit.  He is aware.

## 2016-08-23 NOTE — Assessment & Plan Note (Signed)
His Cr is improved.  Will cont to watch.  Will mark "acute" as resolved

## 2016-08-23 NOTE — Addendum Note (Signed)
Addended by: Linnell FullingBRANNON, Dalon Reichart N on: 08/23/2016 05:19 PM   Modules accepted: Orders

## 2016-08-23 NOTE — Assessment & Plan Note (Addendum)
He is doing well Encouraged by CD4 going up.  Continue marinol Have condoms.  rx's refilled.  Mening vax today rtc in 6 months

## 2016-08-23 NOTE — Assessment & Plan Note (Signed)
bp well controlled off meds.  Will cont to watch.

## 2016-08-23 NOTE — Progress Notes (Signed)
   Subjective:    Patient ID: Casey Hurley, male    DOB: 1970/07/03, 46 y.o.   MRN: [id number]  HPI 46 yo M with recently dx AIDS/PCP, adm to [facility] 2-28 to 08-28-14. Was started on genvoya He has been taking his ART well.  Wt is 146 Still smoking 1ppd.  Has cut back his ETOH consumption- only weekends, no longer binge. Only 4-5 beers. Rare single weekday beer.   [facility].  Taking marinol regularly, eating more and sleeping better.   HIV 1 RNA Quant (copies/mL)  Date Value  08/09/2016 <20 NOT DETECTED  01/20/2016 <20  08/09/2015 <20   CD4 T Cell Abs (/uL)  Date Value  08/09/2016 160 (L)  01/20/2016 100 (L)  08/10/2015 110 (L)    Review of Systems  Constitutional: Negative for appetite change, chills, fever and unexpected weight change.  Gastrointestinal: Negative for constipation and diarrhea.  Genitourinary: Negative for difficulty urinating.  Psychiatric/Behavioral: Negative for sleep disturbance.  Please see HPI. 12 point ROS o/w (-)     Objective:   Physical Exam  Constitutional: He appears well-developed and well-nourished.  HENT:  Mouth/Throat: No oropharyngeal exudate.  Eyes: EOM are normal. Pupils are equal, round, and reactive to light.  Neck: Neck supple.  Cardiovascular: Normal rate, regular rhythm and normal heart sounds.   Pulmonary/Chest: Effort normal and breath sounds normal.  Abdominal: Soft. Bowel sounds are normal. There is no tenderness. There is no rebound.  Lymphadenopathy:    He has no cervical adenopathy.       Assessment & Plan:

## 2016-08-23 NOTE — Assessment & Plan Note (Signed)
He has cut back significantly.  He is encouraged.

## 2016-08-25 ENCOUNTER — Other Ambulatory Visit: Payer: Self-pay | Admitting: Infectious Diseases

## 2016-08-27 ENCOUNTER — Other Ambulatory Visit: Payer: Self-pay | Admitting: Infectious Diseases

## 2016-08-27 DIAGNOSIS — B20 Human immunodeficiency virus [HIV] disease: Secondary | ICD-10-CM

## 2016-09-21 ENCOUNTER — Other Ambulatory Visit: Payer: Self-pay | Admitting: Infectious Diseases

## 2016-09-21 DIAGNOSIS — B20 Human immunodeficiency virus [HIV] disease: Secondary | ICD-10-CM

## 2016-10-23 ENCOUNTER — Ambulatory Visit: Payer: BC Managed Care – PPO

## 2016-10-24 ENCOUNTER — Ambulatory Visit (INDEPENDENT_AMBULATORY_CARE_PROVIDER_SITE_OTHER): Payer: BC Managed Care – PPO | Admitting: *Deleted

## 2016-10-24 DIAGNOSIS — Z23 Encounter for immunization: Secondary | ICD-10-CM

## 2016-10-24 DIAGNOSIS — B20 Human immunodeficiency virus [HIV] disease: Secondary | ICD-10-CM | POA: Diagnosis not present

## 2017-01-24 ENCOUNTER — Other Ambulatory Visit: Payer: BC Managed Care – PPO

## 2017-01-24 ENCOUNTER — Other Ambulatory Visit (HOSPITAL_COMMUNITY)
Admission: RE | Admit: 2017-01-24 | Discharge: 2017-01-24 | Disposition: A | Discharge status: Home / Self Care | Payer: BC Managed Care – PPO | Source: Ambulatory Visit | Attending: Infectious Diseases | Admitting: Infectious Diseases

## 2017-01-24 DIAGNOSIS — B20 Human immunodeficiency virus [HIV] disease: Secondary | ICD-10-CM | POA: Insufficient documentation

## 2017-01-24 DIAGNOSIS — Z113 Encounter for screening for infections with a predominantly sexual mode of transmission: Secondary | ICD-10-CM | POA: Insufficient documentation

## 2017-01-24 DIAGNOSIS — Z79899 Other long term (current) drug therapy: Secondary | ICD-10-CM

## 2017-01-24 LAB — CBC
HCT: 45.6 % (ref 38.5–50.0)
Hemoglobin: 15.8 g/dL (ref 13.2–17.1)
MCH: 36.3 pg — AB (ref 27.0–33.0)
MCHC: 34.6 g/dL (ref 32.0–36.0)
MCV: 104.8 fL — ABNORMAL HIGH (ref 80.0–100.0)
MPV: 10.1 fL (ref 7.5–12.5)
Platelets: 193 10*3/uL (ref 140–400)
RBC: 4.35 MIL/uL (ref 4.20–5.80)
RDW: 13.6 % (ref 11.0–15.0)
WBC: 5.2 10*3/uL (ref 3.8–10.8)

## 2017-01-25 LAB — LIPID PANEL
CHOL/HDL RATIO: 4.4 ratio (ref <5.0)
Cholesterol: 161 mg/dL (ref <200)
HDL: 37 mg/dL — ABNORMAL LOW (ref >40)
LDL CALC: 92 mg/dL (ref <100)
Triglycerides: 159 mg/dL — ABNORMAL HIGH (ref <150)
VLDL: 32 mg/dL — AB (ref <30)

## 2017-01-25 LAB — COMPREHENSIVE METABOLIC PANEL
ALBUMIN: 4.2 g/dL (ref 3.6–5.1)
ALT: 12 U/L (ref 9–46)
AST: 18 U/L (ref 10–40)
Alkaline Phosphatase: 83 U/L (ref 40–115)
BILIRUBIN TOTAL: 0.4 mg/dL (ref 0.2–1.2)
BUN: 10 mg/dL (ref 7–25)
CALCIUM: 9 mg/dL (ref 8.6–10.3)
CHLORIDE: 105 mmol/L (ref 98–110)
CO2: 23 mmol/L (ref 20–31)
Creat: 1.17 mg/dL (ref 0.60–1.35)
GLUCOSE: 164 mg/dL — AB (ref 65–99)
Potassium: 3.7 mmol/L (ref 3.5–5.3)
Sodium: 139 mmol/L (ref 135–146)
Total Protein: 6.7 g/dL (ref 6.1–8.1)

## 2017-01-25 LAB — RPR

## 2017-01-25 LAB — T-HELPER CELL (CD4) - (RCID CLINIC ONLY)
CD4 % Helper T Cell: 10 % — ABNORMAL LOW (ref 33–55)
CD4 T CELL ABS: 220 /uL — AB (ref 400–2700)

## 2017-01-26 LAB — URINE CYTOLOGY ANCILLARY ONLY
CHLAMYDIA, DNA PROBE: NEGATIVE
NEISSERIA GONORRHEA: NEGATIVE

## 2017-01-27 LAB — HIV-1 RNA QUANT-NO REFLEX-BLD
HIV 1 RNA QUANT: NOT DETECTED {copies}/mL
HIV-1 RNA QUANT, LOG: NOT DETECTED {Log_copies}/mL

## 2017-02-07 ENCOUNTER — Encounter: Payer: Self-pay | Admitting: Infectious Diseases

## 2017-02-07 ENCOUNTER — Ambulatory Visit (INDEPENDENT_AMBULATORY_CARE_PROVIDER_SITE_OTHER): Payer: BC Managed Care – PPO | Admitting: Infectious Diseases

## 2017-02-07 VITALS — BP 123/88 | HR 88 | Temp 98.3°F | Wt 136.0 lb

## 2017-02-07 DIAGNOSIS — F1020 Alcohol dependence, uncomplicated: Secondary | ICD-10-CM | POA: Diagnosis not present

## 2017-02-07 DIAGNOSIS — Z79899 Other long term (current) drug therapy: Secondary | ICD-10-CM | POA: Diagnosis not present

## 2017-02-07 DIAGNOSIS — Z119 Encounter for screening for infectious and parasitic diseases, unspecified: Secondary | ICD-10-CM | POA: Diagnosis not present

## 2017-02-07 DIAGNOSIS — B20 Human immunodeficiency virus [HIV] disease: Secondary | ICD-10-CM

## 2017-02-07 DIAGNOSIS — I10 Essential (primary) hypertension: Secondary | ICD-10-CM | POA: Diagnosis not present

## 2017-02-07 DIAGNOSIS — R739 Hyperglycemia, unspecified: Secondary | ICD-10-CM | POA: Diagnosis not present

## 2017-02-07 DIAGNOSIS — R000 Tachycardia, unspecified: Secondary | ICD-10-CM | POA: Diagnosis not present

## 2017-02-07 NOTE — Assessment & Plan Note (Signed)
Will check A1C.  

## 2017-02-07 NOTE — Assessment & Plan Note (Signed)
Has been drinking less, watching closely

## 2017-02-07 NOTE — Assessment & Plan Note (Signed)
BP nl today.

## 2017-02-07 NOTE — Assessment & Plan Note (Signed)
Etiology is unclear.  Attributes to stress, exertion.  Only exercise is walking at work.  Will check gxt

## 2017-02-07 NOTE — Progress Notes (Signed)
   Subjective:    Patient ID: Casey Hurley, male    DOB: [birth year], 46 y.o.   MRN: [id number]  HPI 46 yo M dx AIDS/PCP when adm to [facility] 2-28 to 08-28-14. Was started on genvoya Has been feeling well.  Still taking marinol, eating well but not gaining wt.  Glc was elevated at last lab draw. Ate breakfast prior to labs.   Review of Systems  Constitutional: Positive for fatigue. Negative for appetite change and unexpected weight change.  Respiratory: Negative for cough and shortness of breath.   Cardiovascular: Negative for chest pain.  Gastrointestinal: Negative for constipation and diarrhea.  Endocrine: Negative for polyuria.  Genitourinary: Negative for difficulty urinating.  Neurological: Positive for headaches. Negative for dizziness and numbness.  has been having elevated HR when he gets excited.  Takes prn tylenol, ibuprofen for headaches. occas jittery in afternoons.     Objective:   Physical Exam  Constitutional: He appears well-developed and well-nourished.  HENT:  Mouth/Throat: No oropharyngeal exudate.  Eyes: Pupils are equal, round, and reactive to light. EOM are normal.  Neck: Neck supple.  Cardiovascular: Normal rate, regular rhythm and normal heart sounds.   Pulmonary/Chest: Effort normal and breath sounds normal.  Abdominal: Soft. Bowel sounds are normal. There is no tenderness. There is no rebound.  Musculoskeletal: He exhibits no edema.  Lymphadenopathy:    He has no cervical adenopathy.      Assessment & Plan:

## 2017-02-07 NOTE — Assessment & Plan Note (Signed)
He is doing very well.  Will continue genvoya.  Offered/refused condoms.  vax are up to date.  Will see back in 6 months.

## 2017-02-08 ENCOUNTER — Telehealth: Payer: Self-pay | Admitting: Infectious Diseases

## 2017-02-08 LAB — HEMOGLOBIN A1C
Hgb A1c MFr Bld: 4.6 % (ref <5.7)
MEAN PLASMA GLUCOSE: 85 mg/dL

## 2017-02-08 LAB — HEPATITIS B SURFACE ANTIBODY,QUALITATIVE: HEP B S AB: NONREACTIVE

## 2017-02-08 NOTE — Telephone Encounter (Signed)
Called pt and let him know that his DM screening test was (-).

## 2017-03-01 ENCOUNTER — Other Ambulatory Visit: Payer: Self-pay | Admitting: Infectious Diseases

## 2017-03-01 DIAGNOSIS — B20 Human immunodeficiency virus [HIV] disease: Secondary | ICD-10-CM

## 2017-03-02 ENCOUNTER — Other Ambulatory Visit: Payer: Self-pay | Admitting: *Deleted

## 2017-03-02 DIAGNOSIS — B20 Human immunodeficiency virus [HIV] disease: Secondary | ICD-10-CM

## 2017-03-02 MED ORDER — DRONABINOL 5 MG PO CAPS: 5.0000 mg | ORAL_CAPSULE | Freq: Two times a day (BID) | ORAL | 2 refills | Status: DC

## 2017-03-16 IMAGING — DX DG KNEE COMPLETE 4+V*R*
5 series · 5 of 5 positions shown · non-contrast
Comparison: None.

CLINICAL DATA: Initial evaluation for acute right knee pain, recent
fall.

EXAM:
RIGHT KNEE - COMPLETE 4+ VIEW

[knee ap]
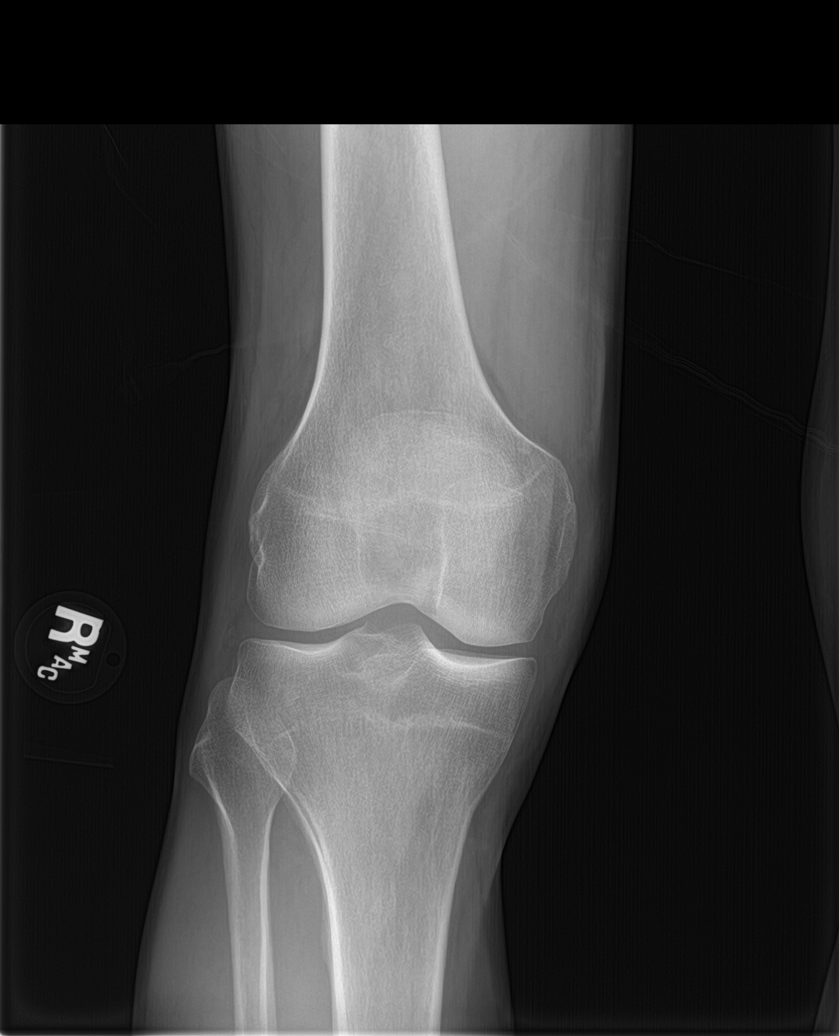

[tunnel]
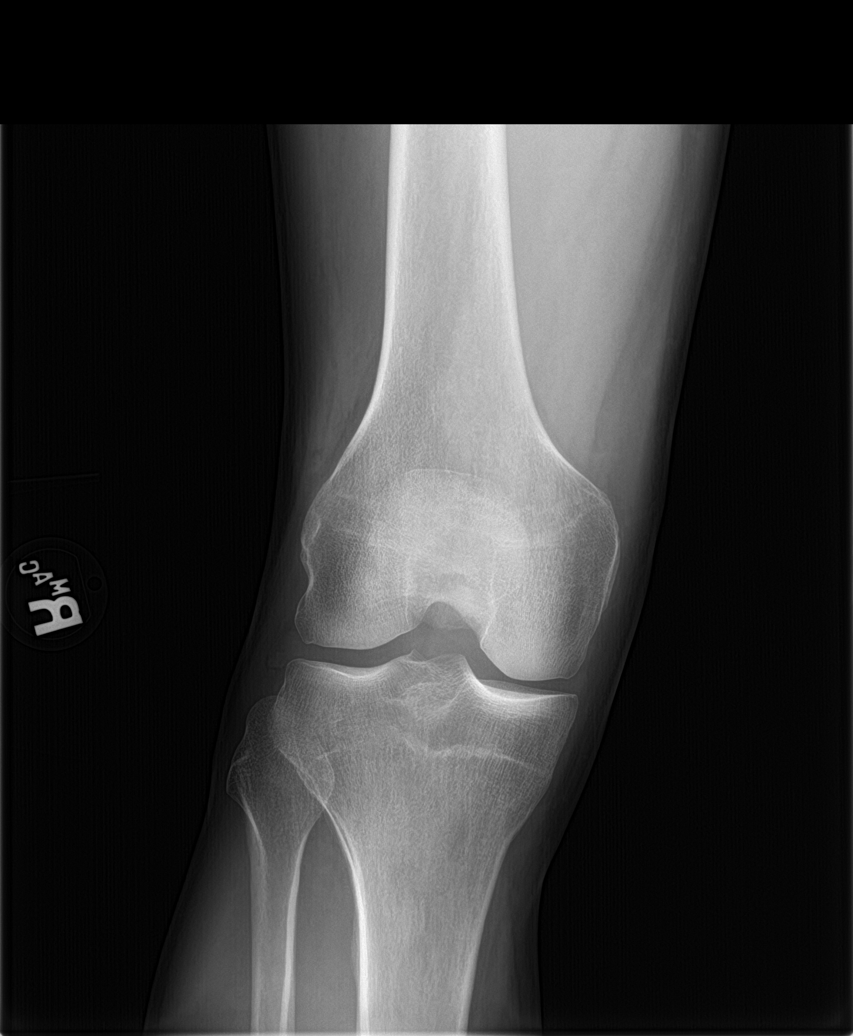

[knee lat (1 of 2)]
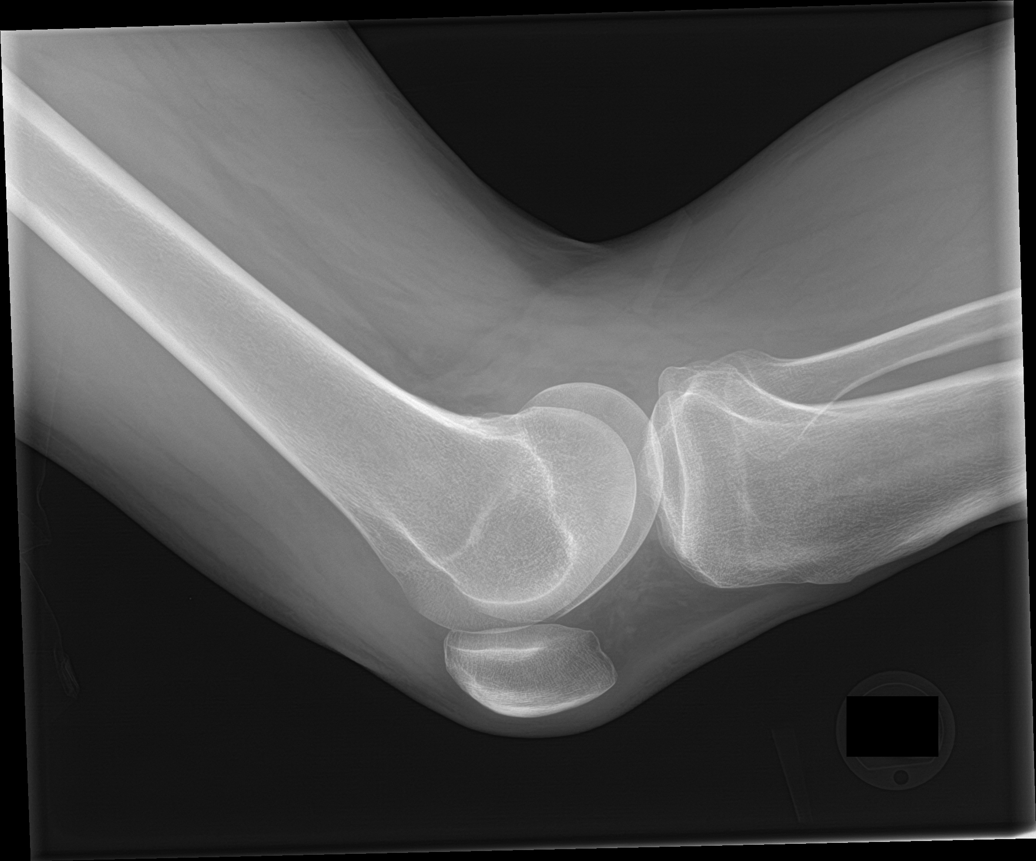

[knee sunrise]
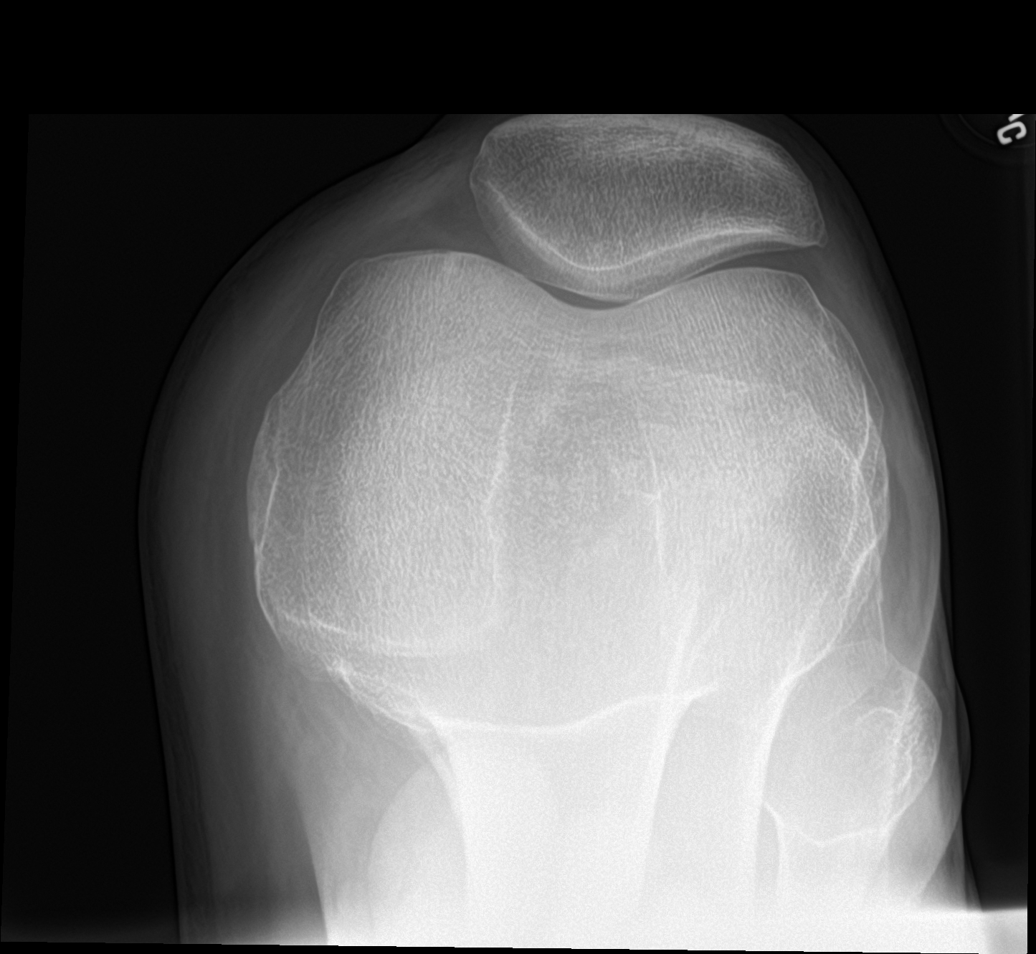

[knee lat (2 of 2)]
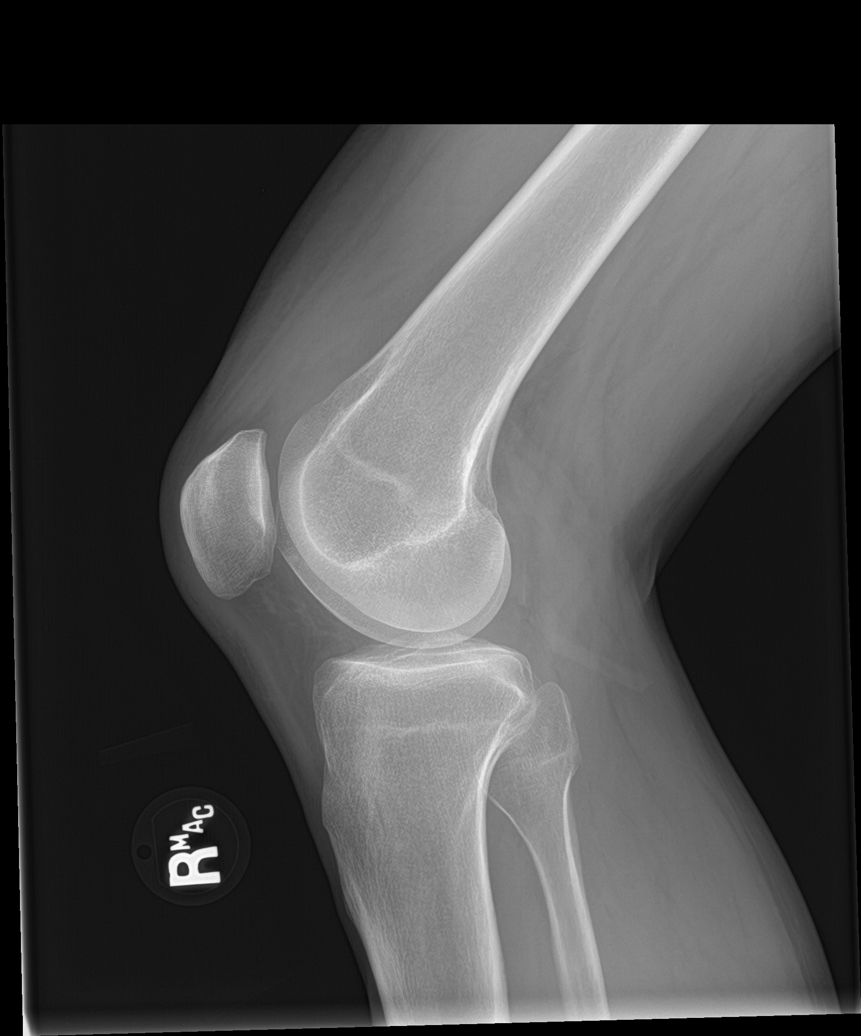

[5 of 5 positions shown; findings below may reference images not displayed]

FINDINGS: No acute fracture or dislocation. Small joint effusion. Minimal
degenerative spurring at the patella. Osseous mineralization normal.
No soft tissue abnormality.
IMPRESSION: 1. No acute fracture or dislocation.
2. Small joint effusion.

## 2017-03-20 ENCOUNTER — Ambulatory Visit: Payer: BC Managed Care – PPO | Admitting: Cardiology

## 2017-04-14 NOTE — Progress Notes (Signed)
Cardiology Office Note   Date:  04/18/2017   ID:  Casey Hurley, DOB Aug 09, 1970, MRN [id number]  PCP:  Maurice SmallGriffin, Elaine, MD  Cardiologist:   Azaiah Licciardi [place], MD   Chief Complaint  Patient presents with  . Follow-up  . Tachycardia      History of Present Illness: Casey Hurley is a 46 y.o. male who is seen at the request of Dr. Ninetta LightsHatcher for evaluation of tachycardia. He has a history of AIDS with PCP infection- diagnosed in 2016. He states that ever since then he has periods where his heart will race. This is worse with activity- sometimes walking around house or with sexual activity. Usually lasts until he calms down. HR may go up to 160-170 by pulse ox. He does drink 3 Mountain Dews per day and occ. Coffee and tea. Also smokes 1 ppd. Drinks approximately 3 beers/day. Denies any chest pain, dyspnea, dizziness, syncope. Works as a [employer].    Past Medical History:  Diagnosis Date  . Acquired immune deficiency syndrome (HCC)   . Genital herpes   . HIV (human immunodeficiency virus infection) (HCC)   . Hypertension   . PCP (pneumocystis carinii pneumonia) (HCC)     Past Surgical History:  Procedure Laterality Date  . ANAL EXAMINATION UNDER ANESTHESIA     anal condylomas  . VIDEO BRONCHOSCOPY Bilateral 08/25/2014   Procedure: VIDEO BRONCHOSCOPY WITHOUT FLUORO;  Surgeon: Leslye Peerobert S Byrum, MD;  Location: [facility];  Service: Cardiopulmonary;  Laterality: Bilateral;     Current Outpatient Prescriptions  Medication Sig Dispense Refill  . dronabinol (MARINOL) 5 MG capsule Take 1 capsule (5 mg total) by mouth 2 (two) times daily before a meal. 180 capsule 2  . GENVOYA 150-150-200-10 MG TABS tablet TAKE 1 TABLET BY MOUTH ONCE A DAY WITH BREAKFAST 90 tablet 4  . valACYclovir (VALTREX) 500 MG tablet TAKE 1 TABLET BY MOUTH DAILY 90 tablet 1   No current facility-administered medications for this visit.     Allergies:   Restoril [temazepam]    Social History:  The patient   reports that he has been smoking Cigarettes.  He started smoking about 2 years ago. He has been smoking about 1.00 pack per day. He has never used smokeless tobacco. He reports that he drinks about 3.6 oz of alcohol per week . He reports that he does not use drugs.   Family History:  The patient's family history includes Breast cancer in his unknown relative; Heart attack in his father; Heart disease in his father; Prostate cancer in his father.    ROS:  Please see the history of present illness.   Otherwise, review of systems are positive for none.   All other systems are reviewed and negative.    PHYSICAL EXAM: VS:  BP 122/88   Pulse 70   Ht 5\' 11"  (1.803 m)   Wt 141 lb (64 kg)   BMI 19.67 kg/m  , BMI Body mass index is 19.67 kg/m. GEN: Well nourished, thin, in no acute distress  HEENT: normal  Neck: no JVD, carotid bruits, or masses Cardiac: RRR; no murmurs, rubs, or gallops,no edema  Respiratory:  clear to auscultation bilaterally, normal work of breathing GI: soft, nontender, nondistended, + BS MS: no deformity or atrophy  Skin: warm and dry, no rash Neuro:  Strength and sensation are intact Psych: euthymic mood, full affect   EKG:  EKG is ordered today. The ekg ordered today demonstrates NSR with Normal Ecg. I have  personally reviewed and interpreted this study.    Recent Labs: 01/24/2017: ALT 12; BUN 10; Creat 1.17; Hemoglobin 15.8; Platelets 193; Potassium 3.7; Sodium 139    Lipid Panel    Component Value Date/Time   CHOL 161 01/24/2017 1549   TRIG 159 (H) 01/24/2017 1549   HDL 37 (L) 01/24/2017 1549   CHOLHDL 4.4 01/24/2017 1549   VLDL 32 (H) 01/24/2017 1549   LDLCALC 92 01/24/2017 1549      Wt Readings from Last 3 Encounters:  04/18/17 141 lb (64 kg)  02/07/17 136 lb (61.7 kg)  08/23/16 143 lb (64.9 kg)      Other studies Reviewed: Additional studies/ records that were reviewed today include: none. Review of the above records demonstrates:  N/A   ASSESSMENT AND PLAN:  1.  Symptoms of tachycardia. Need to rule out primary arrhythmia versus sinus tachycardia. Symptoms are sporadic so will have him wear an event monitor. Follow up in 6 weeks. Encourage smoking cessation and avoidance of caffeine.    Current medicines are reviewed at length with the patient today.  The patient does not have concerns regarding medicines.  The following changes have been made:  no change  Labs/ tests ordered today include: Event monitor No orders of the defined types were placed in this encounter.    Disposition:   FU with me in 6 weeks  Signed, Kamari Bilek [place], MD  04/18/2017 2:21 PM    [facility] [address], [place], [place], [address] Phone [phone], Fax [phone]

## 2017-04-18 ENCOUNTER — Ambulatory Visit (INDEPENDENT_AMBULATORY_CARE_PROVIDER_SITE_OTHER): Payer: BC Managed Care – PPO | Admitting: Cardiology

## 2017-04-18 ENCOUNTER — Encounter: Payer: Self-pay | Admitting: Cardiology

## 2017-04-18 VITALS — BP 122/88 | HR 70 | Ht 71.0 in | Wt 141.0 lb

## 2017-04-18 DIAGNOSIS — R002 Palpitations: Secondary | ICD-10-CM

## 2017-04-18 DIAGNOSIS — I479 Paroxysmal tachycardia, unspecified: Secondary | ICD-10-CM

## 2017-04-18 NOTE — Patient Instructions (Signed)
We will have you wear an event monitor and follow up after that

## 2017-05-01 ENCOUNTER — Ambulatory Visit (INDEPENDENT_AMBULATORY_CARE_PROVIDER_SITE_OTHER): Payer: BC Managed Care – PPO

## 2017-05-01 DIAGNOSIS — I479 Paroxysmal tachycardia, unspecified: Secondary | ICD-10-CM | POA: Diagnosis not present

## 2017-05-01 DIAGNOSIS — R002 Palpitations: Secondary | ICD-10-CM | POA: Diagnosis not present

## 2017-07-09 ENCOUNTER — Ambulatory Visit: Payer: BC Managed Care – PPO | Admitting: Cardiology

## 2017-07-30 ENCOUNTER — Other Ambulatory Visit: Payer: BC Managed Care – PPO

## 2017-07-30 ENCOUNTER — Ambulatory Visit (INDEPENDENT_AMBULATORY_CARE_PROVIDER_SITE_OTHER): Payer: BC Managed Care – PPO | Admitting: Behavioral Health

## 2017-07-30 ENCOUNTER — Other Ambulatory Visit (HOSPITAL_COMMUNITY)
Admission: RE | Admit: 2017-07-30 | Discharge: 2017-07-30 | Disposition: A | Discharge status: Home / Self Care | Payer: BC Managed Care – PPO | Source: Ambulatory Visit | Attending: Infectious Diseases | Admitting: Infectious Diseases

## 2017-07-30 DIAGNOSIS — Z119 Encounter for screening for infectious and parasitic diseases, unspecified: Secondary | ICD-10-CM

## 2017-07-30 DIAGNOSIS — Z23 Encounter for immunization: Secondary | ICD-10-CM | POA: Diagnosis not present

## 2017-07-30 DIAGNOSIS — Z79899 Other long term (current) drug therapy: Secondary | ICD-10-CM

## 2017-07-30 DIAGNOSIS — B20 Human immunodeficiency virus [HIV] disease: Secondary | ICD-10-CM

## 2017-07-30 NOTE — Addendum Note (Signed)
Addended byJimmy Picket: ABBITT, KATRINA F on: 07/30/2017 08:27 AM   Modules accepted: Orders

## 2017-07-30 NOTE — Addendum Note (Signed)
Addended by: ABBITT, KATRINA F on: 07/30/2017 08:27 AM ° ° Modules accepted: Orders ° °

## 2017-07-31 LAB — URINE CYTOLOGY ANCILLARY ONLY
Chlamydia: NEGATIVE
Neisseria Gonorrhea: NEGATIVE

## 2017-07-31 LAB — COMPREHENSIVE METABOLIC PANEL
AG RATIO: 1.5 (calc) (ref 1.0–2.5)
ALBUMIN MSPROF: 4.3 g/dL (ref 3.6–5.1)
ALKALINE PHOSPHATASE (APISO): 64 U/L (ref 40–115)
ALT: 10 U/L (ref 9–46)
AST: 16 U/L (ref 10–40)
BUN: 20 mg/dL (ref 7–25)
CHLORIDE: 103 mmol/L (ref 98–110)
CO2: 27 mmol/L (ref 20–32)
CREATININE: 1.32 mg/dL (ref 0.60–1.35)
Calcium: 9.1 mg/dL (ref 8.6–10.3)
GLOBULIN: 2.8 g/dL (ref 1.9–3.7)
Glucose, Bld: 105 mg/dL — ABNORMAL HIGH (ref 65–99)
POTASSIUM: 4.4 mmol/L (ref 3.5–5.3)
Sodium: 139 mmol/L (ref 135–146)
Total Bilirubin: 0.6 mg/dL (ref 0.2–1.2)
Total Protein: 7.1 g/dL (ref 6.1–8.1)

## 2017-07-31 LAB — T-HELPER CELL (CD4) - (RCID CLINIC ONLY)
CD4 T CELL HELPER: 11 % — AB (ref 33–55)
CD4 T Cell Abs: 200 /uL — ABNORMAL LOW (ref 400–2700)

## 2017-07-31 LAB — CBC
HEMATOCRIT: 42 % (ref 38.5–50.0)
Hemoglobin: 15 g/dL (ref 13.2–17.1)
MCH: 35.9 pg — AB (ref 27.0–33.0)
MCHC: 35.7 g/dL (ref 32.0–36.0)
MCV: 100.5 fL — AB (ref 80.0–100.0)
MPV: 10.4 fL (ref 7.5–12.5)
PLATELETS: 221 10*3/uL (ref 140–400)
RBC: 4.18 10*6/uL — ABNORMAL LOW (ref 4.20–5.80)
RDW: 12.2 % (ref 11.0–15.0)
WBC: 6.3 10*3/uL (ref 3.8–10.8)

## 2017-07-31 LAB — LIPID PANEL
CHOL/HDL RATIO: 3.8 (calc) (ref <5.0)
CHOLESTEROL: 166 mg/dL (ref <200)
HDL: 44 mg/dL (ref >40)
LDL Cholesterol (Calc): 98 mg/dL (calc)
Non-HDL Cholesterol (Calc): 122 mg/dL (calc) (ref <130)
Triglycerides: 147 mg/dL (ref <150)

## 2017-07-31 LAB — RPR: RPR Ser Ql: NONREACTIVE

## 2017-08-01 LAB — HIV-1 RNA QUANT-NO REFLEX-BLD
HIV 1 RNA QUANT: NOT DETECTED {copies}/mL
HIV-1 RNA QUANT, LOG: NOT DETECTED {Log_copies}/mL

## 2017-08-02 ENCOUNTER — Ambulatory Visit: Payer: BC Managed Care – PPO

## 2017-08-13 ENCOUNTER — Ambulatory Visit: Payer: BC Managed Care – PPO | Admitting: Infectious Diseases

## 2017-08-13 DIAGNOSIS — I10 Essential (primary) hypertension: Secondary | ICD-10-CM | POA: Diagnosis not present

## 2017-08-13 DIAGNOSIS — A630 Anogenital (venereal) warts: Secondary | ICD-10-CM | POA: Diagnosis not present

## 2017-08-13 DIAGNOSIS — A6001 Herpesviral infection of penis: Secondary | ICD-10-CM | POA: Diagnosis not present

## 2017-08-13 DIAGNOSIS — F17200 Nicotine dependence, unspecified, uncomplicated: Secondary | ICD-10-CM | POA: Diagnosis not present

## 2017-08-13 DIAGNOSIS — B20 Human immunodeficiency virus [HIV] disease: Secondary | ICD-10-CM | POA: Diagnosis not present

## 2017-08-13 MED ORDER — NICOTINE 7 MG/24HR TD PT24: 7.0000 mg | MEDICATED_PATCH | Freq: Every day | TRANSDERMAL | Status: AC

## 2017-08-13 MED ORDER — VALACYCLOVIR HCL 500 MG PO TABS: 500.0000 mg | ORAL_TABLET | Freq: Every day | ORAL | 3 refills | Status: DC

## 2017-08-13 MED ORDER — NICOTINE 7 MG/24HR TD PT24: 21.0000 mg | MEDICATED_PATCH | Freq: Every day | TRANSDERMAL | Status: AC

## 2017-08-13 MED ORDER — NICOTINE 7 MG/24HR TD PT24: 14.0000 mg | MEDICATED_PATCH | Freq: Every day | TRANSDERMAL | Status: AC

## 2017-08-13 NOTE — Assessment & Plan Note (Signed)
Will refill valtrex 

## 2017-08-13 NOTE — Assessment & Plan Note (Signed)
BP has been good.  Has been seen by [facility] as well, "according to heart doctor, my heart is fine"

## 2017-08-13 NOTE — Assessment & Plan Note (Signed)
No further

## 2017-08-13 NOTE — Assessment & Plan Note (Addendum)
Doing well Not on bactrim Has gotten flu shot.  Cr has been stable on cobi.  Offered/refused condoms.  Wife is negative, gets tested bi-yearly. She was on truvada but did not like. [facility] not currently recruiting (for depo/long acting) .  We discussed her risk (< 1:100,000) rtc in 9 months.

## 2017-08-13 NOTE — Progress Notes (Signed)
   Subjective:    Patient ID: Casey Hurley, male    DOB: 11-Jul-1970, 47 y.o.   MRN: [id number]  HPI 46yo M dx AIDS/PCP when adm to [facility] 2-28 to 08-28-14. Was started on genvoya. Is ready to quit smoking.  Does not want to take chanitx again. Wants to try patches- currently at 1ppd.  Has been feeling well.  Eats well, just doesn't gain wt.   HIV 1 RNA Quant (copies/mL)  Date Value  07/30/2017 <20 NOT DETECTED  01/24/2017 <20 NOT DETECTED  08/09/2016 <20 NOT DETECTED   CD4 T Cell Abs (/uL)  Date Value  07/30/2017 200 (L)  01/24/2017 220 (L)  08/09/2016 160 (L)    Review of Systems  Constitutional: Negative for appetite change and unexpected weight change.  Respiratory: Negative for cough and shortness of breath.   Gastrointestinal: Negative for constipation and diarrhea.  Genitourinary: Negative for difficulty urinating.  Neurological: Negative for dizziness and headaches.  Psychiatric/Behavioral: Negative for sleep disturbance.  Please see HPI. All other systems reviewed and negative.      Objective:   Physical Exam  Constitutional: He appears well-developed and well-nourished.  HENT:  Mouth/Throat: No oropharyngeal exudate.  Eyes: EOM are normal. Pupils are equal, round, and reactive to light.  Neck: Neck supple.  Cardiovascular: Normal rate, regular rhythm and normal heart sounds.  Pulmonary/Chest: Effort normal and breath sounds normal.  Abdominal: Soft. Bowel sounds are normal. There is no tenderness. There is no rebound.  Musculoskeletal: He exhibits no edema.  Lymphadenopathy:    He has no cervical adenopathy.  Psychiatric: He has a normal mood and affect.       Assessment & Plan:

## 2017-08-13 NOTE — Assessment & Plan Note (Signed)
Will start him on nicotine patch 21 mg for 6 weeks, 14mg  for 2 weeks, 7 mg for 2 weeks.

## 2017-09-17 ENCOUNTER — Other Ambulatory Visit: Payer: Self-pay | Admitting: Infectious Diseases

## 2017-09-17 DIAGNOSIS — B20 Human immunodeficiency virus [HIV] disease: Secondary | ICD-10-CM

## 2017-11-08 ENCOUNTER — Other Ambulatory Visit: Payer: Self-pay | Admitting: Infectious Diseases

## 2017-11-08 DIAGNOSIS — B20 Human immunodeficiency virus [HIV] disease: Secondary | ICD-10-CM

## 2017-11-27 ENCOUNTER — Other Ambulatory Visit: Payer: Self-pay | Admitting: Infectious Diseases

## 2017-11-27 DIAGNOSIS — B20 Human immunodeficiency virus [HIV] disease: Secondary | ICD-10-CM

## 2018-03-11 ENCOUNTER — Ambulatory Visit
Admission: RE | Admit: 2018-03-11 | Discharge: 2018-03-11 | Disposition: A | Discharge status: Home / Self Care | Payer: BC Managed Care – PPO | Source: Ambulatory Visit | Attending: Family Medicine | Admitting: Family Medicine

## 2018-03-11 ENCOUNTER — Other Ambulatory Visit: Payer: Self-pay | Admitting: Family Medicine

## 2018-03-11 DIAGNOSIS — R05 Cough: Secondary | ICD-10-CM

## 2018-03-11 DIAGNOSIS — R059 Cough, unspecified: Secondary | ICD-10-CM

## 2018-04-26 ENCOUNTER — Other Ambulatory Visit: Payer: BC Managed Care – PPO

## 2018-04-26 DIAGNOSIS — B20 Human immunodeficiency virus [HIV] disease: Secondary | ICD-10-CM

## 2018-04-26 LAB — T-HELPER CELL (CD4) - (RCID CLINIC ONLY)
CD4 T CELL ABS: 160 /uL — AB (ref 400–2700)
CD4 T CELL HELPER: 10 % — AB (ref 33–55)

## 2018-04-29 ENCOUNTER — Other Ambulatory Visit: Payer: BC Managed Care – PPO

## 2018-04-29 LAB — HIV-1 RNA QUANT-NO REFLEX-BLD
HIV 1 RNA Quant: <20 copies/mL
HIV-1 RNA QUANT, LOG: NOT DETECTED {Log_copies}/mL

## 2018-04-29 LAB — COMPREHENSIVE METABOLIC PANEL
AG Ratio: 1.8 (calc) (ref 1.0–2.5)
ALT: 10 U/L (ref 9–46)
AST: 20 U/L (ref 10–40)
Albumin: 4.6 g/dL (ref 3.6–5.1)
Alkaline phosphatase (APISO): 79 U/L (ref 40–115)
BUN: 14 mg/dL (ref 7–25)
CALCIUM: 9.5 mg/dL (ref 8.6–10.3)
CO2: 26 mmol/L (ref 20–32)
Chloride: 104 mmol/L (ref 98–110)
Creat: 1.22 mg/dL (ref 0.60–1.35)
Globulin: 2.6 g/dL (calc) (ref 1.9–3.7)
Glucose, Bld: 91 mg/dL (ref 65–99)
Potassium: 5.2 mmol/L (ref 3.5–5.3)
SODIUM: 138 mmol/L (ref 135–146)
TOTAL PROTEIN: 7.2 g/dL (ref 6.1–8.1)
Total Bilirubin: 0.6 mg/dL (ref 0.2–1.2)

## 2018-05-14 ENCOUNTER — Ambulatory Visit: Payer: BC Managed Care – PPO | Admitting: Infectious Diseases

## 2018-05-14 ENCOUNTER — Encounter: Payer: Self-pay | Admitting: Infectious Diseases

## 2018-05-14 VITALS — BP 137/87 | HR 89 | Temp 98.4°F | Wt 139.0 lb

## 2018-05-14 DIAGNOSIS — Z113 Encounter for screening for infections with a predominantly sexual mode of transmission: Secondary | ICD-10-CM

## 2018-05-14 DIAGNOSIS — Z23 Encounter for immunization: Secondary | ICD-10-CM | POA: Diagnosis not present

## 2018-05-14 DIAGNOSIS — Z79899 Other long term (current) drug therapy: Secondary | ICD-10-CM

## 2018-05-14 DIAGNOSIS — F17200 Nicotine dependence, unspecified, uncomplicated: Secondary | ICD-10-CM

## 2018-05-14 DIAGNOSIS — B20 Human immunodeficiency virus [HIV] disease: Secondary | ICD-10-CM

## 2018-05-14 DIAGNOSIS — I10 Essential (primary) hypertension: Secondary | ICD-10-CM | POA: Diagnosis not present

## 2018-05-14 NOTE — Assessment & Plan Note (Signed)
He is doing well Some drop in CD4, will check again in f/u at 6 months Offered/refused condoms.  Flu shot today PCV today Will get Tdap at next visit.

## 2018-05-14 NOTE — Progress Notes (Signed)
   Subjective:    Patient ID: Casey Hurley, male    DOB: 18-Jun-1971, 47 y.o.   MRN: [id number]  HPI 46yo M dx AIDS/PCPwhenadm to [facility] 2-28 to 08-28-14. Was started on genvoya. At his last visit (07-2017) he was started on nicotine patches. He then decided to start chantix.  Has been feeling well. No problems with ART.  Wants new PCP that is more inclined to deal with HIV patients. Was recently seen by PCP and he wanted to more aggressive care than he got.  Has some worries about his state insurance plan and ability to [facility].   HIV 1 RNA Quant (copies/mL)  Date Value  04/26/2018 <20 NOT DETECTED  07/30/2017 <20 NOT DETECTED  01/24/2017 <20 NOT DETECTED   CD4 T Cell Abs (/uL)  Date Value  04/26/2018 160 (L)  07/30/2017 200 (L)  01/24/2017 220 (L)    Review of Systems  Constitutional: Negative for appetite change and unexpected weight change.  Respiratory: Negative for cough and shortness of breath.   Gastrointestinal: Negative for constipation and diarrhea.  Genitourinary: Negative for difficulty urinating.  Psychiatric/Behavioral: Negative for sleep disturbance.  Please see HPI. All other systems reviewed and negative.      Objective:   Physical Exam  Constitutional: He is oriented to person, place, and time. He appears well-developed and well-nourished.  HENT:  Mouth/Throat: No oropharyngeal exudate.  Eyes: Pupils are equal, round, and reactive to light. EOM are normal.  Neck: Normal range of motion. Neck supple.  Cardiovascular: Normal rate, regular rhythm and normal heart sounds.  Pulmonary/Chest: Effort normal and breath sounds normal.  Abdominal: Soft. Bowel sounds are normal. He exhibits no distension. There is no tenderness.  Musculoskeletal: He exhibits no edema.  Lymphadenopathy:    He has no cervical adenopathy.  Neurological: He is oriented to person, place, and time.  Psychiatric: He has a normal mood and affect.      Assessment & Plan:

## 2018-05-14 NOTE — Assessment & Plan Note (Signed)
He is doing well, on no rx. Higher end of normal.

## 2018-05-14 NOTE — Addendum Note (Signed)
Addended by: Lorenso CourierMALDONADO, Pessy Delamar L on: 05/14/2018 04:11 PM   Modules accepted: Orders

## 2018-05-14 NOTE — Assessment & Plan Note (Signed)
Still trying to determine his optimal pharmacotherapy aide.

## 2018-05-27 ENCOUNTER — Telehealth: Payer: Self-pay | Admitting: *Deleted

## 2018-05-27 ENCOUNTER — Other Ambulatory Visit: Payer: Self-pay | Admitting: *Deleted

## 2018-05-27 DIAGNOSIS — B20 Human immunodeficiency virus [HIV] disease: Secondary | ICD-10-CM

## 2018-05-27 MED ORDER — SULFAMETHOXAZOLE-TRIMETHOPRIM 800-160 MG PO TABS: 1.0000 | ORAL_TABLET | Freq: Two times a day (BID) | ORAL | 3 refills | Status: DC

## 2018-05-27 NOTE — Telephone Encounter (Signed)
Medication sent to pharmacy and patient called, message left that medication has been sent.

## 2018-05-27 NOTE — Telephone Encounter (Signed)
Patient called today to request his oral antibiotic back. He says he was taken off at his last visit and since then he has been sick and thinks he needs it back. He also wants to be referred to Dr Susann GivensLalonde as they discussed at last visit. Advised will ask the provider and give him a call back once Hatcher responds.

## 2018-05-27 NOTE — Telephone Encounter (Signed)
Please restart bactrim ds 1 po qday Continue til his CD4 is over 200 again

## 2018-08-02 ENCOUNTER — Other Ambulatory Visit: Payer: Self-pay | Admitting: Infectious Diseases

## 2018-08-02 DIAGNOSIS — B20 Human immunodeficiency virus [HIV] disease: Secondary | ICD-10-CM

## 2018-08-05 NOTE — Telephone Encounter (Signed)
[facility] via phone -[facility] to [facility] [address]. Patient called and made aware of refill authorized.  Valarie ConesShaquenia Mignon Bechler, LPN

## 2018-09-10 ENCOUNTER — Other Ambulatory Visit: Payer: Self-pay | Admitting: Infectious Diseases

## 2018-09-10 DIAGNOSIS — B20 Human immunodeficiency virus [HIV] disease: Secondary | ICD-10-CM

## 2018-09-12 ENCOUNTER — Other Ambulatory Visit: Payer: Self-pay | Admitting: Infectious Diseases

## 2018-09-12 DIAGNOSIS — B20 Human immunodeficiency virus [HIV] disease: Secondary | ICD-10-CM

## 2018-10-29 ENCOUNTER — Other Ambulatory Visit: Payer: BC Managed Care – PPO

## 2018-10-29 ENCOUNTER — Other Ambulatory Visit (HOSPITAL_COMMUNITY)
Admission: RE | Admit: 2018-10-29 | Discharge: 2018-10-29 | Disposition: A | Discharge status: Home / Self Care | Payer: BC Managed Care – PPO | Source: Ambulatory Visit | Attending: Infectious Diseases | Admitting: Infectious Diseases

## 2018-10-29 ENCOUNTER — Other Ambulatory Visit: Payer: Self-pay

## 2018-10-29 DIAGNOSIS — Z113 Encounter for screening for infections with a predominantly sexual mode of transmission: Secondary | ICD-10-CM

## 2018-10-29 DIAGNOSIS — B20 Human immunodeficiency virus [HIV] disease: Secondary | ICD-10-CM

## 2018-10-29 DIAGNOSIS — Z79899 Other long term (current) drug therapy: Secondary | ICD-10-CM

## 2018-10-29 LAB — T-HELPER CELL (CD4) - (RCID CLINIC ONLY)
CD4 % Helper T Cell: 11 % — ABNORMAL LOW (ref 33–65)
CD4 T Cell Abs: 158 /uL — ABNORMAL LOW (ref 400–1790)

## 2018-10-30 LAB — URINE CYTOLOGY ANCILLARY ONLY
Chlamydia: NEGATIVE
Neisseria Gonorrhea: NEGATIVE

## 2018-11-02 LAB — LIPID PANEL
Cholesterol: 191 mg/dL (ref <200)
HDL: 48 mg/dL (ref >=40)
LDL Cholesterol (Calc): 118 mg/dL (calc) — ABNORMAL HIGH
Non-HDL Cholesterol (Calc): 143 mg/dL (calc) — ABNORMAL HIGH (ref <130)
Total CHOL/HDL Ratio: 4 (calc) (ref <5.0)
Triglycerides: 139 mg/dL (ref <150)

## 2018-11-02 LAB — CBC
HCT: 47 % (ref 38.5–50.0)
Hemoglobin: 16.7 g/dL (ref 13.2–17.1)
MCH: 36.5 pg — ABNORMAL HIGH (ref 27.0–33.0)
MCHC: 35.5 g/dL (ref 32.0–36.0)
MCV: 102.8 fL — ABNORMAL HIGH (ref 80.0–100.0)
MPV: 10.4 fL (ref 7.5–12.5)
Platelets: 252 10*3/uL (ref 140–400)
RBC: 4.57 10*6/uL (ref 4.20–5.80)
RDW: 12.8 % (ref 11.0–15.0)
WBC: 10 10*3/uL (ref 3.8–10.8)

## 2018-11-02 LAB — COMPREHENSIVE METABOLIC PANEL
AG Ratio: 2 (calc) (ref 1.0–2.5)
ALT: 14 U/L (ref 9–46)
AST: 22 U/L (ref 10–40)
Albumin: 4.7 g/dL (ref 3.6–5.1)
Alkaline phosphatase (APISO): 79 U/L (ref 36–130)
BUN: 13 mg/dL (ref 7–25)
CO2: 27 mmol/L (ref 20–32)
Calcium: 9.4 mg/dL (ref 8.6–10.3)
Chloride: 104 mmol/L (ref 98–110)
Creat: 1.28 mg/dL (ref 0.60–1.35)
Globulin: 2.3 g/dL (calc) (ref 1.9–3.7)
Glucose, Bld: 100 mg/dL — ABNORMAL HIGH (ref 65–99)
Potassium: 4.1 mmol/L (ref 3.5–5.3)
Sodium: 139 mmol/L (ref 135–146)
Total Bilirubin: 0.6 mg/dL (ref 0.2–1.2)
Total Protein: 7 g/dL (ref 6.1–8.1)

## 2018-11-02 LAB — HIV-1 RNA QUANT-NO REFLEX-BLD
HIV 1 RNA Quant: <20 copies/mL
HIV-1 RNA Quant, Log: <1.3 Log copies/mL

## 2018-11-02 LAB — RPR: RPR Ser Ql: NONREACTIVE

## 2018-11-11 ENCOUNTER — Telehealth: Payer: Self-pay | Admitting: Infectious Diseases

## 2018-11-11 NOTE — Telephone Encounter (Signed)
COVID-19 Pre-Screening Questions: ° °Do you currently have a fever (>100 °F), chills or unexplained body aches? No  ° °Are you currently experiencing new cough, shortness of breath, sore throat, runny nose? No  °•  °Have you recently travelled outside the state of [place] in the last 14 days? No  °•  °Have you been in contact with someone that is currently pending confirmation of Covid19 testing or has been confirmed to have the Covid19 virus?  No  °

## 2018-11-12 ENCOUNTER — Other Ambulatory Visit: Payer: Self-pay

## 2018-11-12 ENCOUNTER — Ambulatory Visit: Payer: BC Managed Care – PPO | Admitting: Infectious Diseases

## 2018-11-12 ENCOUNTER — Encounter: Payer: Self-pay | Admitting: Infectious Diseases

## 2018-11-12 VITALS — BP 135/91 | HR 86 | Temp 98.4°F | Ht 70.0 in | Wt 139.0 lb

## 2018-11-12 DIAGNOSIS — Z113 Encounter for screening for infections with a predominantly sexual mode of transmission: Secondary | ICD-10-CM | POA: Diagnosis not present

## 2018-11-12 DIAGNOSIS — A630 Anogenital (venereal) warts: Secondary | ICD-10-CM

## 2018-11-12 DIAGNOSIS — B20 Human immunodeficiency virus [HIV] disease: Secondary | ICD-10-CM

## 2018-11-12 DIAGNOSIS — Z23 Encounter for immunization: Secondary | ICD-10-CM

## 2018-11-12 DIAGNOSIS — Z79899 Other long term (current) drug therapy: Secondary | ICD-10-CM

## 2018-11-12 DIAGNOSIS — I10 Essential (primary) hypertension: Secondary | ICD-10-CM | POA: Diagnosis not present

## 2018-11-12 DIAGNOSIS — F17200 Nicotine dependence, unspecified, uncomplicated: Secondary | ICD-10-CM

## 2018-11-12 DIAGNOSIS — N529 Male erectile dysfunction, unspecified: Secondary | ICD-10-CM

## 2018-11-12 MED ORDER — IMIQUIMOD 5 % EX CREA: TOPICAL_CREAM | CUTANEOUS | 1 refills | Status: DC

## 2018-11-12 NOTE — Assessment & Plan Note (Signed)
Encouraged to quit. 

## 2018-11-12 NOTE — Assessment & Plan Note (Signed)
Borderline.  Will continue to watch.

## 2018-11-12 NOTE — Addendum Note (Signed)
Addended by: Andree Coss on: 11/12/2018 04:38 PM   Modules accepted: Orders

## 2018-11-12 NOTE — Assessment & Plan Note (Signed)
Will refill his aldara.

## 2018-11-12 NOTE — Progress Notes (Signed)
   Subjective:    Patient ID: Casey Hurley, male    DOB: [birth year], 48 y.o.   MRN: [id number]  HPI 47yo M dx AIDS[facility]whenadm [facility]-28 to 08-28-14. Was started on genvoya. At his last visit (07-2017) he was started on nicotine patches. He then decided to start chantix.  He has resumed smoking. Was down to 1 ppwk. Until sent home from work and wife got furloughed. Knows he can quit.  Wife is doing well.   HIV 1 RNA Quant (copies/mL)  Date Value  10/29/2018 <20 NOT DETECTED  04/26/2018 <20 NOT DETECTED  07/30/2017 <20 NOT DETECTED   CD4 T Cell Abs (/uL)  Date Value  10/29/2018 158 (L)  04/26/2018 160 (L)  07/30/2017 200 (L)    Review of Systems  Constitutional: Negative for chills and fever.  Respiratory: Negative for cough and shortness of breath.   Gastrointestinal: Negative for blood in stool and constipation.  Genitourinary: Negative for difficulty urinating.  Psychiatric/Behavioral: Negative for sleep disturbance.  Please see HPI. All other systems reviewed and negative.      Objective:   Physical Exam Constitutional:      Appearance: Normal appearance.  HENT:     Mouth/Throat:     Mouth: Mucous membranes are moist.     Pharynx: No oropharyngeal exudate.  Eyes:     Extraocular Movements: Extraocular movements intact.     Pupils: Pupils are equal, round, and reactive to light.  Neck:     Musculoskeletal: Normal range of motion and neck supple.  Cardiovascular:     Rate and Rhythm: Normal rate and regular rhythm.  Pulmonary:     Effort: Pulmonary effort is normal.     Breath sounds: Normal breath sounds.  Abdominal:     General: Bowel sounds are normal.     Palpations: Abdomen is soft.     Tenderness: There is no abdominal tenderness.  Neurological:     Mental Status: He is alert.  Psychiatric:        Mood and Affect: Mood normal.       Assessment & Plan:

## 2018-11-12 NOTE — Assessment & Plan Note (Signed)
Has prev been on cialis, states it did not work He states he has been seen by renal. He defers repeat eval although he describes his ED as "frustrating".

## 2018-11-12 NOTE — Assessment & Plan Note (Addendum)
He is doing well Is on paid admin leave.  Will continue genvoya.  Will follow his CD4, still low but he is staying healthy. I encouraged him.  His Cr is WNL but upper limit.  Offered/refused condoms.  Tdap today.  Will see him back in 9 months.

## 2018-12-12 DIAGNOSIS — B20 Human immunodeficiency virus [HIV] disease: Secondary | ICD-10-CM

## 2018-12-12 DIAGNOSIS — B370 Candidal stomatitis: Secondary | ICD-10-CM

## 2018-12-17 ENCOUNTER — Other Ambulatory Visit: Payer: Self-pay | Admitting: Infectious Diseases

## 2018-12-17 DIAGNOSIS — B20 Human immunodeficiency virus [HIV] disease: Secondary | ICD-10-CM

## 2018-12-17 MED ORDER — FLUCONAZOLE 100 MG PO TABS: 100.0000 mg | ORAL_TABLET | Freq: Every day | ORAL | 3 refills | Status: DC

## 2018-12-17 NOTE — Telephone Encounter (Signed)
Sent in; thanks

## 2019-01-14 ENCOUNTER — Encounter: Payer: Self-pay | Admitting: Infectious Diseases

## 2019-01-14 NOTE — Progress Notes (Signed)
Letter printed and patient notified.

## 2019-01-16 ENCOUNTER — Ambulatory Visit: Payer: BC Managed Care – PPO | Admitting: Infectious Diseases

## 2019-01-16 ENCOUNTER — Encounter: Payer: Self-pay | Admitting: Infectious Diseases

## 2019-01-16 ENCOUNTER — Other Ambulatory Visit: Payer: Self-pay

## 2019-01-16 DIAGNOSIS — F99 Mental disorder, not otherwise specified: Secondary | ICD-10-CM

## 2019-01-16 DIAGNOSIS — B20 Human immunodeficiency virus [HIV] disease: Secondary | ICD-10-CM

## 2019-01-16 DIAGNOSIS — G4700 Insomnia, unspecified: Secondary | ICD-10-CM | POA: Insufficient documentation

## 2019-01-16 DIAGNOSIS — F419 Anxiety disorder, unspecified: Secondary | ICD-10-CM | POA: Diagnosis not present

## 2019-01-16 DIAGNOSIS — F5105 Insomnia due to other mental disorder: Secondary | ICD-10-CM | POA: Diagnosis not present

## 2019-01-16 DIAGNOSIS — F17200 Nicotine dependence, unspecified, uncomplicated: Secondary | ICD-10-CM

## 2019-01-16 MED ORDER — TRAZODONE 25 MG HALF TABLET: 25.0000 mg | ORAL_TABLET | Freq: Every evening | ORAL | Status: DC | PRN

## 2019-01-16 MED ORDER — TRAZODONE HCL 50 MG PO TABS: 25.0000 mg | ORAL_TABLET | Freq: Every day | ORAL | 0 refills | Status: DC

## 2019-01-16 MED ORDER — ALPRAZOLAM 0.25 MG PO TABS: 0.2500 mg | ORAL_TABLET | Freq: Two times a day (BID) | ORAL | 0 refills | Status: DC | PRN

## 2019-01-16 NOTE — Assessment & Plan Note (Signed)
Encouraged to quit. 

## 2019-01-16 NOTE — Assessment & Plan Note (Addendum)
Not sexually active. Wife tested this year.   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   He is coming home anxious from work --> napping --> not able to sleep --> anxious at work --->

## 2019-01-16 NOTE — Progress Notes (Signed)
   Subjective:    Patient ID: Casey Hurley, male    DOB: [birth year], 48 y.o.   MRN: [id number]  HPI 48yo M dx AIDS/[facility]Pwhenadm to [facility] 2-28 to 08-28-14. Was started on genvoya.  He has resumed smoking. Has increased from previous. 1ppd.  Having anxiety and anger issues related to work. Works in a [employer]. He was written a letter 7-17 which he has not yet picked up.  He comes home exhausted then is unable to sleep all night after daytime sleeping.  Trying to move out of his current work environment.  Was given xanax by his wife. Still does not sleep all night.  Wife states when he was out of work was his best 8 weeks.   He is interested in/considering disability.   HIV 1 RNA Quant (copies/mL)  Date Value  10/29/2018 <20 NOT DETECTED  04/26/2018 <20 NOT DETECTED  07/30/2017 <20 NOT DETECTED   CD4 T Cell Abs (/uL)  Date Value  10/29/2018 158 (L)  04/26/2018 160 (L)  07/30/2017 200 (L)    Review of Systems  Constitutional: Negative for appetite change and unexpected weight change.  Respiratory: Negative for cough and shortness of breath.   Gastrointestinal: Positive for diarrhea. Negative for constipation.  Genitourinary: Negative for difficulty urinating.  Psychiatric/Behavioral: Positive for dysphoric mood and sleep disturbance.  cialis worked, can't afford it.  Takes temp bid. No sick exposures.  Had case of thrush 2 weeks ago, got flucon from PCP.  Please see HPI. All other systems reviewed and negative.     Objective:   Physical Exam Constitutional:      Appearance: He is not ill-appearing.  HENT:     Mouth/Throat:     Mouth: Mucous membranes are moist.     Pharynx: No oropharyngeal exudate.  Eyes:     Extraocular Movements: Extraocular movements intact.     Pupils: Pupils are equal, round, and reactive to light.  Neck:     Musculoskeletal: Normal range of motion and neck supple.  Cardiovascular:     Rate and Rhythm: Normal rate and regular  rhythm.  Pulmonary:     Effort: Pulmonary effort is normal.     Breath sounds: Normal breath sounds.  Abdominal:     General: Abdomen is flat. Bowel sounds are normal. There is no distension.     Tenderness: There is no abdominal tenderness.  Musculoskeletal:     Right lower leg: No edema.     Left lower leg: No edema.  Neurological:     General: No focal deficit present.     Mental Status: He is alert.  Psychiatric:        Mood and Affect: Mood normal.       Assessment & Plan:

## 2019-01-16 NOTE — Addendum Note (Signed)
Addended by: Landis Gandy on: 01/16/2019 04:30 PM   Modules accepted: Orders

## 2019-01-16 NOTE — Assessment & Plan Note (Signed)
Will give him short course of xanax at he and his wife's request.  They are aware this is a one time rx.

## 2019-01-16 NOTE — Assessment & Plan Note (Signed)
No daytime naps No TV or computers in bed Nothing to drink after 9pm If unable to sleep, get out of bed and do something else for 30 minutes  Trial of trazadone.  He has been on restoril prior and did not like.  Has heard horror stories of ambien.

## 2019-02-14 ENCOUNTER — Other Ambulatory Visit: Payer: Self-pay

## 2019-02-14 DIAGNOSIS — B20 Human immunodeficiency virus [HIV] disease: Secondary | ICD-10-CM

## 2019-02-14 MED ORDER — GENVOYA 150-150-200-10 MG PO TABS: 1.0000 | ORAL_TABLET | Freq: Every day | ORAL | 0 refills | Status: DC

## 2019-03-12 ENCOUNTER — Other Ambulatory Visit: Payer: Self-pay | Admitting: Infectious Diseases

## 2019-03-12 DIAGNOSIS — B20 Human immunodeficiency virus [HIV] disease: Secondary | ICD-10-CM

## 2019-03-18 ENCOUNTER — Other Ambulatory Visit: Payer: Self-pay | Admitting: Infectious Diseases

## 2019-03-18 ENCOUNTER — Telehealth: Payer: Self-pay | Admitting: Pharmacy Technician

## 2019-03-18 ENCOUNTER — Other Ambulatory Visit: Payer: Self-pay | Admitting: *Deleted

## 2019-03-18 DIAGNOSIS — B3781 Candidal esophagitis: Secondary | ICD-10-CM

## 2019-03-18 DIAGNOSIS — B370 Candidal stomatitis: Secondary | ICD-10-CM

## 2019-03-18 MED ORDER — FLUCONAZOLE 200 MG PO TABS: 200.0000 mg | ORAL_TABLET | Freq: Every day | ORAL | 2 refills | Status: DC

## 2019-03-18 NOTE — Telephone Encounter (Signed)
I will look into options for patient assistance for the patient until [place] starts for Genvoya. The other medications would probably have to be reduced with GoodRx but I can look into that as well. I will follow up with the patient

## 2019-03-18 NOTE — Telephone Encounter (Signed)
Adonis Brook or Inez Catalina - would you be able to help Catrell while he is in between jobs with insurance coverage?  He is currently signing up for COBRA. Thank you!!  Landis Gandy, RN

## 2019-03-18 NOTE — Telephone Encounter (Signed)
[facility] Patient Advocate Encounter   Patient has been approved for [employer] Advancing Access Patient Assistance Program for [place] for 30 days. This assistance will make the patient's copay $0.  I have spoken with the patient and they will call [facility] with the numbers below for billing his next claim.  The billing information is as follows:  Member ID: [id number]  [place]: [id number] PCN: [id number] Group: [id number]  Patient knows to call the office with questions or concerns.  Bartholomew Crews, CPhT Specialty Pharmacy Patient [facility] for [facility] Phone: [phone] Fax: [phone] 03/18/2019 11:41 AM

## 2019-03-26 NOTE — Telephone Encounter (Signed)
[facility] Patient Advocate Encounter   Patient has been approved for [employer] Advancing Access Patient Assistance Program for Genvoya from 03/18/2019 to 03/17/2020. This assistance will make the patient's copay $0.  I have spoken with the patient and they have ordered 90-days of medication recently from his mail order pharmacy and should not need this information until January 2021. I provided him the billing numbers below to provide to the pharmacy when they call to setup his next refill.   The billing information is same as below.  Patient knows to call the office with questions or concerns.  Bartholomew Crews, CPhT Specialty Pharmacy Patient [facility] for Infectious Disease Phone: [phone] Fax: [phone] 03/26/2019 3:52 PM

## 2019-05-12 ENCOUNTER — Other Ambulatory Visit: Payer: Self-pay | Admitting: Infectious Diseases

## 2019-05-12 DIAGNOSIS — B20 Human immunodeficiency virus [HIV] disease: Secondary | ICD-10-CM

## 2019-07-22 ENCOUNTER — Other Ambulatory Visit: Payer: Self-pay

## 2019-07-22 DIAGNOSIS — B20 Human immunodeficiency virus [HIV] disease: Secondary | ICD-10-CM

## 2019-07-22 MED ORDER — VALACYCLOVIR HCL 500 MG PO TABS: ORAL_TABLET | ORAL | 0 refills | Status: DC

## 2019-08-12 NOTE — Addendum Note (Signed)
Addended by: Ladena Jacquez D on: 08/12/2019 08:49 AM   Modules accepted: Orders  

## 2019-08-13 ENCOUNTER — Other Ambulatory Visit: Payer: Self-pay

## 2019-08-13 DIAGNOSIS — Z79899 Other long term (current) drug therapy: Secondary | ICD-10-CM

## 2019-08-13 DIAGNOSIS — B20 Human immunodeficiency virus [HIV] disease: Secondary | ICD-10-CM

## 2019-08-13 DIAGNOSIS — Z113 Encounter for screening for infections with a predominantly sexual mode of transmission: Secondary | ICD-10-CM

## 2019-08-14 LAB — URINE CYTOLOGY ANCILLARY ONLY
Chlamydia: NEGATIVE
Comment: NEGATIVE
Comment: NORMAL
Neisseria Gonorrhea: NEGATIVE

## 2019-08-14 LAB — T-HELPER CELL (CD4) - (RCID CLINIC ONLY)
CD4 % Helper T Cell: 13 % — ABNORMAL LOW (ref 33–65)
CD4 T Cell Abs: 245 /uL — ABNORMAL LOW (ref 400–1790)

## 2019-08-15 ENCOUNTER — Other Ambulatory Visit: Payer: BC Managed Care – PPO

## 2019-08-16 LAB — COMPREHENSIVE METABOLIC PANEL
AG Ratio: 1.7 (calc) (ref 1.0–2.5)
ALT: 15 U/L (ref 9–46)
AST: 21 U/L (ref 10–40)
Albumin: 4.5 g/dL (ref 3.6–5.1)
Alkaline phosphatase (APISO): 80 U/L (ref 36–130)
BUN: 18 mg/dL (ref 7–25)
CO2: 26 mmol/L (ref 20–32)
Calcium: 9.7 mg/dL (ref 8.6–10.3)
Chloride: 106 mmol/L (ref 98–110)
Creat: 1.33 mg/dL (ref 0.60–1.35)
Globulin: 2.6 g/dL (calc) (ref 1.9–3.7)
Glucose, Bld: 82 mg/dL (ref 65–99)
Potassium: 4.3 mmol/L (ref 3.5–5.3)
Sodium: 141 mmol/L (ref 135–146)
Total Bilirubin: 0.7 mg/dL (ref 0.2–1.2)
Total Protein: 7.1 g/dL (ref 6.1–8.1)

## 2019-08-16 LAB — LIPID PANEL
Cholesterol: 203 mg/dL — ABNORMAL HIGH (ref <200)
HDL: 49 mg/dL (ref >=40)
LDL Cholesterol (Calc): 129 mg/dL (calc) — ABNORMAL HIGH
Non-HDL Cholesterol (Calc): 154 mg/dL (calc) — ABNORMAL HIGH (ref <130)
Total CHOL/HDL Ratio: 4.1 (calc) (ref <5.0)
Triglycerides: 136 mg/dL (ref <150)

## 2019-08-16 LAB — CBC
HCT: 43.7 % (ref 38.5–50.0)
Hemoglobin: 15.4 g/dL (ref 13.2–17.1)
MCH: 36.5 pg — ABNORMAL HIGH (ref 27.0–33.0)
MCHC: 35.2 g/dL (ref 32.0–36.0)
MCV: 103.6 fL — ABNORMAL HIGH (ref 80.0–100.0)
MPV: 10.2 fL (ref 7.5–12.5)
Platelets: 230 10*3/uL (ref 140–400)
RBC: 4.22 10*6/uL (ref 4.20–5.80)
RDW: 12.4 % (ref 11.0–15.0)
WBC: 6.7 10*3/uL (ref 3.8–10.8)

## 2019-08-16 LAB — HIV-1 RNA QUANT-NO REFLEX-BLD
HIV 1 RNA Quant: <20 copies/mL — AB
HIV-1 RNA Quant, Log: <1.3 Log copies/mL — AB

## 2019-08-16 LAB — RPR: RPR Ser Ql: NONREACTIVE

## 2019-08-25 ENCOUNTER — Telehealth: Payer: Self-pay

## 2019-08-25 NOTE — Telephone Encounter (Signed)
COVID-19 Pre-Screening Questions:08/25/19   Do you currently have a fever (>100 F), chills or unexplained body aches? NO   Are you currently experiencing new cough, shortness of breath, sore throat, runny nose?NO  .  Have you recently travelled outside the state of [place] in the last 14 days? NO  .  Have you been in contact with someone that is currently pending confirmation of Covid19 testing or has been confirmed to have the Covid19 virus?  NO  **If the patient answers NO to ALL questions -  advise the patient to please call the clinic before coming to the office should any symptoms develop.     

## 2019-08-26 ENCOUNTER — Ambulatory Visit (INDEPENDENT_AMBULATORY_CARE_PROVIDER_SITE_OTHER): Payer: Self-pay | Admitting: Infectious Diseases

## 2019-08-26 ENCOUNTER — Other Ambulatory Visit: Payer: Self-pay

## 2019-08-26 ENCOUNTER — Encounter: Payer: Self-pay | Admitting: Infectious Diseases

## 2019-08-26 VITALS — BP 134/89 | HR 87 | Wt 143.0 lb

## 2019-08-26 DIAGNOSIS — F17200 Nicotine dependence, unspecified, uncomplicated: Secondary | ICD-10-CM

## 2019-08-26 DIAGNOSIS — Z23 Encounter for immunization: Secondary | ICD-10-CM

## 2019-08-26 DIAGNOSIS — B20 Human immunodeficiency virus [HIV] disease: Secondary | ICD-10-CM

## 2019-08-26 DIAGNOSIS — F5105 Insomnia due to other mental disorder: Secondary | ICD-10-CM

## 2019-08-26 DIAGNOSIS — F99 Mental disorder, not otherwise specified: Secondary | ICD-10-CM

## 2019-08-26 NOTE — Progress Notes (Signed)
   Subjective:    Patient ID: Casey Hurley, male    DOB: 1971/01/10, 49 y.o.   MRN: [id number]  HPI 48yo M dx AIDS[facility]adm to [facility] 2-28 to 08-28-14. Was started on genvoya. Since his last visit, he has changed jobs.  His father died of lung cancer 2020/06/10.   He has resumed smoking. Has increased from previous. 1ppd. He is aware of need to stop.  Sleeping has been intermittent- 1/2 the night sleep, 1/2 the night tossing.   HIV 1 RNA Quant (copies/mL)  Date Value  08/13/2019 <20 DETECTED (A)  10/29/2018 <20 NOT DETECTED  04/26/2018 <20 NOT DETECTED   CD4 T Cell Abs (/uL)  Date Value  08/13/2019 245 (L)  10/29/2018 158 (L)  04/26/2018 160 (L)    Review of Systems  Constitutional: Negative for appetite change and unexpected weight change.  Respiratory: Negative for cough and shortness of breath.   Gastrointestinal: Negative for constipation and diarrhea.  Genitourinary: Negative for difficulty urinating.  Psychiatric/Behavioral: Positive for sleep disturbance.  Please see HPI. All other systems reviewed and negative.      Objective:   Physical Exam Vitals reviewed.  Constitutional:      Appearance: Normal appearance. He is normal weight.  HENT:     Mouth/Throat:     Mouth: Mucous membranes are moist.     Pharynx: No oropharyngeal exudate.  Eyes:     Extraocular Movements: Extraocular movements intact.     Pupils: Pupils are equal, round, and reactive to light.  Cardiovascular:     Rate and Rhythm: Normal rate and regular rhythm.  Pulmonary:     Effort: Pulmonary effort is normal.     Breath sounds: Normal breath sounds.  Abdominal:     General: Bowel sounds are normal. There is no distension.     Palpations: Abdomen is soft.     Tenderness: There is no abdominal tenderness.  Musculoskeletal:        General: Normal range of motion.     Cervical back: Normal range of motion and neck supple.     Right lower leg: No edema.     Left lower leg: No edema.    Neurological:     General: No focal deficit present.     Mental Status: He is alert.  Psychiatric:        Mood and Affect: Mood normal.           Assessment & Plan:

## 2019-08-26 NOTE — Assessment & Plan Note (Signed)
Has taken prn xanax.  Trazodone available as needed.

## 2019-08-26 NOTE — Assessment & Plan Note (Signed)
He is doing well Needs flu vax.  COVID vax when available.  No change in art Wife/Sandy is doing well, yearly test. Last 06-2019. She has had COVID vax.  Will get him in with [facility]. Needs help with lab copay as well.  rtc in 9 months.

## 2019-08-29 ENCOUNTER — Encounter: Payer: BC Managed Care – PPO | Admitting: Infectious Diseases

## 2019-10-13 ENCOUNTER — Other Ambulatory Visit: Payer: Self-pay | Admitting: Infectious Diseases

## 2019-10-13 DIAGNOSIS — B20 Human immunodeficiency virus [HIV] disease: Secondary | ICD-10-CM

## 2019-10-27 ENCOUNTER — Other Ambulatory Visit: Payer: Self-pay | Admitting: Infectious Diseases

## 2019-10-27 DIAGNOSIS — B20 Human immunodeficiency virus [HIV] disease: Secondary | ICD-10-CM

## 2020-01-31 DIAGNOSIS — N61 Mastitis without abscess: Secondary | ICD-10-CM | POA: Diagnosis not present

## 2020-02-02 DIAGNOSIS — L03313 Cellulitis of chest wall: Secondary | ICD-10-CM | POA: Diagnosis not present

## 2020-02-02 DIAGNOSIS — L02213 Cutaneous abscess of chest wall: Secondary | ICD-10-CM | POA: Diagnosis not present

## 2020-02-03 DIAGNOSIS — N611 Abscess of the breast and nipple: Secondary | ICD-10-CM | POA: Diagnosis not present

## 2020-02-05 ENCOUNTER — Other Ambulatory Visit: Payer: Self-pay

## 2020-02-05 DIAGNOSIS — B20 Human immunodeficiency virus [HIV] disease: Secondary | ICD-10-CM

## 2020-02-05 MED ORDER — GENVOYA 150-150-200-10 MG PO TABS: ORAL_TABLET | ORAL | 0 refills | Status: DC

## 2020-05-24 ENCOUNTER — Other Ambulatory Visit: Payer: Self-pay

## 2020-06-08 ENCOUNTER — Encounter: Payer: Self-pay | Admitting: Infectious Diseases

## 2020-06-15 ENCOUNTER — Telehealth: Payer: Self-pay

## 2020-06-15 NOTE — Telephone Encounter (Signed)
[facility] Patient Advocate Encounter  Completed and sent [employer] Advancing Access application for Genvoya for this patient who is uninsured.    Patient is approved 06/15/20.  BIN      [id number] PCN    [id number] GRP    [id number] ID        [id number]   Clearance Coots, CPhT Specialty Pharmacy Patient [facility] for [facility] Phone: [phone] Fax:  [phone]

## 2020-07-07 ENCOUNTER — Telehealth: Payer: Self-pay

## 2020-07-07 NOTE — Telephone Encounter (Signed)
[facility] Patient Advocate Encounter  Patient would like to start getting his refills from Advancing Access ([facility] @ [phone]).  Patient would like a 90 day supply.    Clearance Coots, CPhT Specialty Pharmacy Patient [facility] for Infectious Disease Phone: [phone] Fax:  [phone]

## 2020-07-09 ENCOUNTER — Other Ambulatory Visit: Payer: Self-pay | Admitting: Infectious Diseases

## 2020-07-09 DIAGNOSIS — B20 Human immunodeficiency virus [HIV] disease: Secondary | ICD-10-CM

## 2020-07-12 ENCOUNTER — Other Ambulatory Visit: Payer: Self-pay

## 2020-07-16 ENCOUNTER — Other Ambulatory Visit: Payer: Self-pay

## 2020-07-16 ENCOUNTER — Other Ambulatory Visit: Payer: BC Managed Care – PPO

## 2020-07-16 DIAGNOSIS — B20 Human immunodeficiency virus [HIV] disease: Secondary | ICD-10-CM | POA: Diagnosis not present

## 2020-07-21 LAB — CBC
HCT: 44.1 % (ref 38.5–50.0)
Hemoglobin: 15.9 g/dL (ref 13.2–17.1)
MCH: 37.4 pg — ABNORMAL HIGH (ref 27.0–33.0)
MCHC: 36.1 g/dL — ABNORMAL HIGH (ref 32.0–36.0)
MCV: 103.8 fL — ABNORMAL HIGH (ref 80.0–100.0)
MPV: 10.3 fL (ref 7.5–12.5)
Platelets: 337 10*3/uL (ref 140–400)
RBC: 4.25 10*6/uL (ref 4.20–5.80)
RDW: 11.8 % (ref 11.0–15.0)
WBC: 5.5 10*3/uL (ref 3.8–10.8)

## 2020-07-21 LAB — COMPREHENSIVE METABOLIC PANEL
AG Ratio: 1.6 (calc) (ref 1.0–2.5)
ALT: 14 U/L (ref 9–46)
AST: 16 U/L (ref 10–40)
Albumin: 4.2 g/dL (ref 3.6–5.1)
Alkaline phosphatase (APISO): 100 U/L (ref 36–130)
BUN: 15 mg/dL (ref 7–25)
CO2: 28 mmol/L (ref 20–32)
Calcium: 9.3 mg/dL (ref 8.6–10.3)
Chloride: 103 mmol/L (ref 98–110)
Creat: 1.06 mg/dL (ref 0.60–1.35)
Globulin: 2.7 g/dL (calc) (ref 1.9–3.7)
Glucose, Bld: 94 mg/dL (ref 65–99)
Potassium: 4.7 mmol/L (ref 3.5–5.3)
Sodium: 138 mmol/L (ref 135–146)
Total Bilirubin: 0.7 mg/dL (ref 0.2–1.2)
Total Protein: 6.9 g/dL (ref 6.1–8.1)

## 2020-07-21 LAB — HIV-1 RNA QUANT-NO REFLEX-BLD
HIV 1 RNA Quant: <20 Copies/mL
HIV-1 RNA Quant, Log: <1.3 Log cps/mL

## 2020-07-21 LAB — T-HELPER CELLS (CD4) COUNT (NOT AT ARMC)
Absolute CD4: 187 cells/uL — ABNORMAL LOW (ref 490–1740)
CD4 T Helper %: 12 % — ABNORMAL LOW (ref 30–61)
Total lymphocyte count: 1591 cells/uL (ref 850–3900)

## 2020-07-27 ENCOUNTER — Ambulatory Visit (INDEPENDENT_AMBULATORY_CARE_PROVIDER_SITE_OTHER): Payer: BC Managed Care – PPO | Admitting: Infectious Diseases

## 2020-07-27 ENCOUNTER — Encounter: Payer: Self-pay | Admitting: Infectious Diseases

## 2020-07-27 ENCOUNTER — Other Ambulatory Visit (HOSPITAL_COMMUNITY)
Admission: RE | Admit: 2020-07-27 | Discharge: 2020-07-27 | Disposition: A | Discharge status: Home / Self Care | Payer: BC Managed Care – PPO | Source: Ambulatory Visit | Attending: Infectious Diseases | Admitting: Infectious Diseases

## 2020-07-27 ENCOUNTER — Other Ambulatory Visit: Payer: Self-pay

## 2020-07-27 VITALS — BP 137/91 | HR 102 | Temp 98.5°F | Wt 138.0 lb

## 2020-07-27 DIAGNOSIS — Z79899 Other long term (current) drug therapy: Secondary | ICD-10-CM

## 2020-07-27 DIAGNOSIS — Z113 Encounter for screening for infections with a predominantly sexual mode of transmission: Secondary | ICD-10-CM | POA: Diagnosis not present

## 2020-07-27 DIAGNOSIS — A630 Anogenital (venereal) warts: Secondary | ICD-10-CM

## 2020-07-27 DIAGNOSIS — I10 Essential (primary) hypertension: Secondary | ICD-10-CM

## 2020-07-27 DIAGNOSIS — R21 Rash and other nonspecific skin eruption: Secondary | ICD-10-CM | POA: Insufficient documentation

## 2020-07-27 DIAGNOSIS — Z1211 Encounter for screening for malignant neoplasm of colon: Secondary | ICD-10-CM

## 2020-07-27 DIAGNOSIS — B20 Human immunodeficiency virus [HIV] disease: Secondary | ICD-10-CM | POA: Diagnosis not present

## 2020-07-27 DIAGNOSIS — F17200 Nicotine dependence, unspecified, uncomplicated: Secondary | ICD-10-CM

## 2020-07-27 MED ORDER — GENVOYA 150-150-200-10 MG PO TABS: ORAL_TABLET | ORAL | 3 refills | Status: DC

## 2020-07-27 MED ORDER — FAMOTIDINE 40 MG PO TABS
40.0000 mg | ORAL_TABLET | Freq: Every day | ORAL | 0 refills | Status: DC
Start: 2020-07-27 — End: 2022-01-31

## 2020-07-27 MED ORDER — METHYLPREDNISOLONE 4 MG PO TBPK: ORAL_TABLET | ORAL | 0 refills | Status: DC

## 2020-07-27 MED ORDER — FAMOTIDINE 40 MG PO TABS: 40.0000 mg | ORAL_TABLET | Freq: Every day | ORAL | 0 refills | Status: DC

## 2020-07-27 NOTE — Assessment & Plan Note (Signed)
Re-enforced need to quit.

## 2020-07-27 NOTE — Assessment & Plan Note (Signed)
He is doing well We discussed his labs.  No new partners. Wife tested positive for COVID yesterday. She is isolating at home.  Will refer for colon.  Wife is (-). U=u.  rtc in 9 months.

## 2020-07-27 NOTE — Assessment & Plan Note (Addendum)
Resolved per pt.  Will try to get sched for colon.

## 2020-07-27 NOTE — Progress Notes (Signed)
   Subjective:    Patient ID: Casey Hurley, male  DOB: 03/30/1971, 50 y.o.        MRN: [id number]   HPI 49yo M dx AIDS/[facility] to 08-28-[facility]. Was started on genvoya. Since his last visit, he has changed jobs.  His father died of lung cancer 06-12-20.   He has resumed smoking.  Today he c/o rash for 1 week. No change in laundry, soap, no new pets, no new foods, no new OTCs.  No pain. On trunk. Non-pruritic.  Has not tried any otcs.  Questions about labs.   Has had COVID vax and booster.   Sick second week of Jan- joint pain, covid tests (-). Had low grade fever.  Has been having night sweats.   HIV 1 RNA Quant  Date Value  07/16/2020 <20 Copies/mL  08/13/2019 <20 DETECTED copies/mL (A)  10/29/2018 <20 NOT DETECTED copies/mL   CD4 T Cell Abs (/uL)  Date Value  08/13/2019 245 (L)  10/29/2018 158 (L)  04/26/2018 160 (L)     Health Maintenance  Topic Date Due  . COLONOSCOPY (Pts 45-66yrs Insurance coverage will need to be confirmed)  Never done  . INFLUENZA VACCINE  01/25/2020  . COVID-19 Vaccine (3 - Pfizer risk 4-dose series) 03/10/2020  . TETANUS/TDAP  11/11/2028  . Hepatitis C Screening  Completed  . HIV Screening  Completed      Review of Systems  Constitutional: Negative for chills, fever and weight loss.  Respiratory: Negative for cough and shortness of breath.   Gastrointestinal: Negative for constipation and diarrhea.  Genitourinary: Negative for dysuria.       No genital lesions, no new partners.   rash  Please see HPI. All other systems reviewed and negative.     Objective:  Physical Exam Vitals reviewed.  Constitutional:      Appearance: Normal appearance.  HENT:     Mouth/Throat:     Mouth: Mucous membranes are moist.     Pharynx: No oropharyngeal exudate.  Eyes:     Extraocular Movements: Extraocular movements intact.     Pupils: Pupils are equal, round, and reactive to light.  Cardiovascular:     Rate and Rhythm:  Normal rate and regular rhythm.  Pulmonary:     Effort: Pulmonary effort is normal.     Breath sounds: Normal breath sounds.  Abdominal:     General: Bowel sounds are normal. There is no distension.     Palpations: Abdomen is soft.     Tenderness: There is no abdominal tenderness.  Musculoskeletal:        General: No swelling. Normal range of motion.     Cervical back: Normal range of motion and neck supple.     Right lower leg: No edema.     Left lower leg: No edema.  Skin:    General: Skin is warm and dry.     Findings: Rash (diffuse macular rash on trunk. ) present.  Neurological:     General: No focal deficit present.     Mental Status: He is alert.         Assessment & Plan:

## 2020-07-27 NOTE — Assessment & Plan Note (Signed)
BP is ok  Will continue to watch.  Appreciate his PCP's f/u.

## 2020-07-27 NOTE — Assessment & Plan Note (Addendum)
Will check RPR, bcx Offered him H2 blockers, steroids taper.  Call if not improved. And will have derm eval.

## 2020-07-28 ENCOUNTER — Telehealth: Payer: Self-pay | Admitting: *Deleted

## 2020-07-28 ENCOUNTER — Telehealth: Payer: Self-pay

## 2020-07-28 LAB — URINE CYTOLOGY ANCILLARY ONLY
Chlamydia: NEGATIVE
Comment: NEGATIVE
Comment: NORMAL
Neisseria Gonorrhea: NEGATIVE

## 2020-07-28 NOTE — Telephone Encounter (Signed)
Patient noted results via MyChart. Patient called for treatment. Patient scheduled for tomorrow. Verbal order per Dr. Ninetta Lights Pen G 2.4 million units IM once.  Valarie Cones

## 2020-07-28 NOTE — Telephone Encounter (Signed)
-----   Message from Ginnie Smart, MD sent at 07/28/2020  3:12 PM EST ----- Pt needs Pen G 2.4 millin units IM x 1 for new RPR+ 1:16, last negative in 2017

## 2020-07-28 NOTE — Telephone Encounter (Signed)
Orders in phone note per Dr Ninetta Lights. Andree Coss, RN

## 2020-07-28 NOTE — Telephone Encounter (Signed)
Patient viewed results via mychart, called to schedule appointment for treatment. Orders per Dr Ninetta Lights below. Andree Coss, RN

## 2020-07-29 ENCOUNTER — Other Ambulatory Visit: Payer: Self-pay

## 2020-07-29 ENCOUNTER — Ambulatory Visit (INDEPENDENT_AMBULATORY_CARE_PROVIDER_SITE_OTHER): Payer: BC Managed Care – PPO

## 2020-07-29 DIAGNOSIS — A539 Syphilis, unspecified: Secondary | ICD-10-CM

## 2020-07-30 DIAGNOSIS — A539 Syphilis, unspecified: Secondary | ICD-10-CM | POA: Diagnosis not present

## 2020-07-30 MED ORDER — PENICILLIN G BENZATHINE 1200000 UNIT/2ML IM SUSP
1.2000 10*6.[IU] | Freq: Once | INTRAMUSCULAR | Status: AC
  Administered 2020-07-30: 1.2 10*6.[IU] via INTRAMUSCULAR

## 2020-08-02 LAB — FLUORESCENT TREPONEMAL AB(FTA)-IGG-BLD: Fluorescent Treponemal ABS: REACTIVE — AB

## 2020-08-02 LAB — CULTURE, BLOOD (SINGLE): MICRO NUMBER:: 11486207

## 2020-08-02 LAB — RPR: RPR Ser Ql: REACTIVE — AB

## 2020-08-02 LAB — RPR TITER: RPR Titer: 1:16 {titer} — ABNORMAL HIGH

## 2020-09-30 ENCOUNTER — Other Ambulatory Visit: Payer: Self-pay | Admitting: Infectious Diseases

## 2020-09-30 DIAGNOSIS — B20 Human immunodeficiency virus [HIV] disease: Secondary | ICD-10-CM

## 2020-09-30 DIAGNOSIS — Z113 Encounter for screening for infections with a predominantly sexual mode of transmission: Secondary | ICD-10-CM

## 2020-09-30 DIAGNOSIS — Z79899 Other long term (current) drug therapy: Secondary | ICD-10-CM

## 2020-09-30 DIAGNOSIS — B370 Candidal stomatitis: Secondary | ICD-10-CM

## 2020-09-30 DIAGNOSIS — B3781 Candidal esophagitis: Secondary | ICD-10-CM

## 2020-10-01 NOTE — Telephone Encounter (Signed)
Patient states he has "white, pasty mouth. Requesting refill for Diflucan. Last refilled a year ago. Patient states he takes it as he needs it and just saves them. Advised patient that medication is prescribed to be taken once daily for 14 days and this may resolve along with taking daily HIV medcation. Patient verbalized understanding. Routing message to MD for medication approval.  Valarie Cones

## 2020-10-06 ENCOUNTER — Other Ambulatory Visit (HOSPITAL_COMMUNITY): Payer: Self-pay

## 2020-10-06 MED FILL — Valacyclovir HCl Tab 500 MG: ORAL | 90 days supply | Qty: 90 | Fill #0 | Status: AC

## 2020-10-07 ENCOUNTER — Other Ambulatory Visit (HOSPITAL_COMMUNITY): Payer: Self-pay

## 2020-10-14 ENCOUNTER — Other Ambulatory Visit (HOSPITAL_COMMUNITY): Payer: Self-pay

## 2020-12-21 ENCOUNTER — Other Ambulatory Visit: Payer: Self-pay | Admitting: Infectious Diseases

## 2020-12-21 DIAGNOSIS — B20 Human immunodeficiency virus [HIV] disease: Secondary | ICD-10-CM

## 2020-12-22 ENCOUNTER — Other Ambulatory Visit (HOSPITAL_COMMUNITY): Payer: Self-pay

## 2020-12-22 MED ORDER — VALACYCLOVIR HCL 500 MG PO TABS
ORAL_TABLET | Freq: Every day | ORAL | 1 refills | Status: DC
  Filled 2020-12-22 – 2020-12-24 (×2): qty 30, 30d supply, fill #0
  Filled 2021-02-17: qty 90, 90d supply, fill #1
  Filled 2021-05-24: qty 90, 90d supply, fill #2
  Filled 2021-05-25 – 2021-05-27 (×2): qty 60, 60d supply, fill #0

## 2020-12-23 ENCOUNTER — Other Ambulatory Visit (HOSPITAL_COMMUNITY): Payer: Self-pay

## 2020-12-24 ENCOUNTER — Other Ambulatory Visit (HOSPITAL_COMMUNITY): Payer: Self-pay

## 2021-02-17 ENCOUNTER — Other Ambulatory Visit (HOSPITAL_COMMUNITY): Payer: Self-pay

## 2021-05-03 ENCOUNTER — Other Ambulatory Visit: Payer: Self-pay

## 2021-05-03 ENCOUNTER — Other Ambulatory Visit (HOSPITAL_COMMUNITY)
Admission: RE | Admit: 2021-05-03 | Discharge: 2021-05-03 | Disposition: A | Discharge status: Home / Self Care | Payer: Self-pay | Source: Ambulatory Visit | Attending: Infectious Diseases | Admitting: Infectious Diseases

## 2021-05-03 DIAGNOSIS — Z79899 Other long term (current) drug therapy: Secondary | ICD-10-CM

## 2021-05-03 DIAGNOSIS — A630 Anogenital (venereal) warts: Secondary | ICD-10-CM

## 2021-05-03 DIAGNOSIS — Z113 Encounter for screening for infections with a predominantly sexual mode of transmission: Secondary | ICD-10-CM | POA: Insufficient documentation

## 2021-05-04 LAB — T-HELPER CELL (CD4) - (RCID CLINIC ONLY)
CD4 % Helper T Cell: 14 % — ABNORMAL LOW (ref 33–65)
CD4 T Cell Abs: 193 /uL — ABNORMAL LOW (ref 400–1790)

## 2021-05-05 LAB — FLUORESCENT TREPONEMAL AB(FTA)-IGG-BLD: Fluorescent Treponemal ABS: REACTIVE — AB

## 2021-05-05 LAB — CBC
HCT: 44.3 % (ref 38.5–50.0)
Hemoglobin: 15.8 g/dL (ref 13.2–17.1)
MCH: 37.9 pg — ABNORMAL HIGH (ref 27.0–33.0)
MCHC: 35.7 g/dL (ref 32.0–36.0)
MCV: 106.2 fL — ABNORMAL HIGH (ref 80.0–100.0)
MPV: 10.7 fL (ref 7.5–12.5)
Platelets: 239 10*3/uL (ref 140–400)
RBC: 4.17 10*6/uL — ABNORMAL LOW (ref 4.20–5.80)
RDW: 11.9 % (ref 11.0–15.0)
WBC: 5.8 10*3/uL (ref 3.8–10.8)

## 2021-05-05 LAB — COMPREHENSIVE METABOLIC PANEL
AG Ratio: 2 (calc) (ref 1.0–2.5)
ALT: 17 U/L (ref 9–46)
AST: 18 U/L (ref 10–40)
Albumin: 4.9 g/dL (ref 3.6–5.1)
Alkaline phosphatase (APISO): 88 U/L (ref 36–130)
BUN/Creatinine Ratio: 13 (calc) (ref 6–22)
BUN: 19 mg/dL (ref 7–25)
CO2: 28 mmol/L (ref 20–32)
Calcium: 10.1 mg/dL (ref 8.6–10.3)
Chloride: 104 mmol/L (ref 98–110)
Creat: 1.49 mg/dL — ABNORMAL HIGH (ref 0.60–1.29)
Globulin: 2.4 g/dL (calc) (ref 1.9–3.7)
Glucose, Bld: 100 mg/dL — ABNORMAL HIGH (ref 65–99)
Potassium: 4.5 mmol/L (ref 3.5–5.3)
Sodium: 140 mmol/L (ref 135–146)
Total Bilirubin: 0.7 mg/dL (ref 0.2–1.2)
Total Protein: 7.3 g/dL (ref 6.1–8.1)

## 2021-05-05 LAB — RPR: RPR Ser Ql: REACTIVE — AB

## 2021-05-05 LAB — LIPID PANEL
Cholesterol: 194 mg/dL (ref <200)
HDL: 47 mg/dL (ref >=40)
LDL Cholesterol (Calc): 118 mg/dL (calc) — ABNORMAL HIGH
Non-HDL Cholesterol (Calc): 147 mg/dL (calc) — ABNORMAL HIGH (ref <130)
Total CHOL/HDL Ratio: 4.1 (calc) (ref <5.0)
Triglycerides: 173 mg/dL — ABNORMAL HIGH (ref <150)

## 2021-05-05 LAB — URINE CYTOLOGY ANCILLARY ONLY
Chlamydia: NEGATIVE
Comment: NEGATIVE
Comment: NORMAL
Neisseria Gonorrhea: NEGATIVE

## 2021-05-05 LAB — RPR TITER: RPR Titer: 1:1 {titer} — ABNORMAL HIGH

## 2021-05-05 LAB — HIV-1 RNA QUANT-NO REFLEX-BLD
HIV 1 RNA Quant: NOT DETECTED Copies/mL
HIV-1 RNA Quant, Log: NOT DETECTED Log cps/mL

## 2021-05-17 ENCOUNTER — Encounter: Payer: BC Managed Care – PPO | Admitting: Infectious Diseases

## 2021-05-24 ENCOUNTER — Ambulatory Visit (INDEPENDENT_AMBULATORY_CARE_PROVIDER_SITE_OTHER): Payer: Self-pay | Admitting: Infectious Diseases

## 2021-05-24 ENCOUNTER — Telehealth: Payer: Self-pay

## 2021-05-24 ENCOUNTER — Other Ambulatory Visit: Payer: Self-pay

## 2021-05-24 VITALS — BP 122/85 | HR 97 | Temp 98.4°F | Resp 16 | Ht 70.0 in | Wt 137.0 lb

## 2021-05-24 DIAGNOSIS — Z23 Encounter for immunization: Secondary | ICD-10-CM

## 2021-05-24 DIAGNOSIS — F1020 Alcohol dependence, uncomplicated: Secondary | ICD-10-CM

## 2021-05-24 DIAGNOSIS — B20 Human immunodeficiency virus [HIV] disease: Secondary | ICD-10-CM

## 2021-05-24 DIAGNOSIS — B3781 Candidal esophagitis: Secondary | ICD-10-CM

## 2021-05-24 DIAGNOSIS — R7989 Other specified abnormal findings of blood chemistry: Secondary | ICD-10-CM

## 2021-05-24 DIAGNOSIS — B370 Candidal stomatitis: Secondary | ICD-10-CM

## 2021-05-24 DIAGNOSIS — F17200 Nicotine dependence, unspecified, uncomplicated: Secondary | ICD-10-CM

## 2021-05-24 MED ORDER — FLUCONAZOLE 200 MG PO TABS: ORAL_TABLET | ORAL | 2 refills | Status: DC

## 2021-05-24 NOTE — Assessment & Plan Note (Signed)
We discussed this could be related to his genvoya (cobi and INI) Also warned him that his ETOH use could be causing this.

## 2021-05-24 NOTE — Assessment & Plan Note (Signed)
He has questions about cabaneuva Will have pharm speak with him.  Doing well on genvoya Flu vax today Wife doing well.  Will see him back in 6 months to recheck his Cr (offered to do today but he defferred).

## 2021-05-24 NOTE — Assessment & Plan Note (Signed)
Encouraged to quit. 

## 2021-05-24 NOTE — Progress Notes (Signed)
   Subjective:    Patient ID: Casey Hurley, male  DOB: July 19, 1970, 50 y.o.        MRN: [id number]   HPI 50 yo M dx AIDS/PCP when adm to [facility]-28 to 08-28-14. Was started on genvoya. His father died of lung cancer 06/04/20.   At his last visit he c/o diffuse rash. 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  He has questions about his test results. Rash resolved.  No problems with his ART.  Has questions about his Cr.  Curious about cabaneuva.   HIV 1 RNA Quant  Date Value  05/03/2021 Not Detected Copies/mL  07/16/2020 <20 Copies/mL  08/13/2019 <20 DETECTED copies/mL (A)   CD4 T Cell Abs (/uL)  Date Value  05/03/2021 193 (L)  08/13/2019 245 (L)  10/29/2018 158 (L)     Health Maintenance  Topic Date Due  . COLONOSCOPY (Pts 45-53yrs Insurance coverage will need to be confirmed)  Never done  . Pneumococcal Vaccine 67-26 Years old (3 - PPSV23 if available, else PCV20) 08/25/2019  . COVID-19 Vaccine (3 - Pfizer risk series) 03/10/2020  . INFLUENZA VACCINE  01/24/2021  . TETANUS/TDAP  11/11/2028  . Hepatitis C Screening  Completed  . HIV Screening  Completed  . HPV VACCINES  Aged Out      Review of Systems  Constitutional:  Negative for chills, fever and weight loss.  HENT:  Positive for congestion and sore throat.   Respiratory:  Positive for cough.   Gastrointestinal:  Negative for constipation and diarrhea.  Genitourinary:  Negative for dysuria.   Please see HPI. All other systems reviewed and negative.     Objective:  Physical Exam Vitals reviewed.  Constitutional:      Appearance: Normal appearance. He is not ill-appearing or toxic-appearing.  HENT:     Mouth/Throat:     Mouth: Mucous membranes are moist.     Pharynx: No oropharyngeal exudate.  Cardiovascular:     Rate and Rhythm: Normal rate and regular rhythm.  Pulmonary:     Effort: Pulmonary effort is normal.     Breath sounds: Normal breath sounds.  Abdominal:     General: Bowel sounds are  normal.     Palpations: Abdomen is soft.  Musculoskeletal:        General: No swelling. Normal range of motion.     Cervical back: Normal range of motion and neck supple.     Right lower leg: No edema.     Left lower leg: No edema.  Neurological:     General: No focal deficit present.     Mental Status: He is alert and oriented to person, place, and time.           Assessment & Plan:

## 2021-05-24 NOTE — Telephone Encounter (Signed)
Patient is interested in starting Cabenuva injections. He currently does not have insurance through his job, but he may end up with coverage through his wife's insurance in January. He is still unsure if this will happen as of now. We will look into Cabenuva coverage for him.   Shirlee More, PharmD PGY2 Infectious Diseases Pharmacy Resident

## 2021-05-24 NOTE — Assessment & Plan Note (Signed)
occas reurrences Will refill diflucan.

## 2021-05-24 NOTE — Assessment & Plan Note (Addendum)
Daily but not intoxicated daily.  2 beer/day.  More on w/e

## 2021-05-25 ENCOUNTER — Other Ambulatory Visit (HOSPITAL_COMMUNITY): Payer: Self-pay

## 2021-05-26 ENCOUNTER — Other Ambulatory Visit (HOSPITAL_COMMUNITY): Payer: Self-pay

## 2021-05-26 ENCOUNTER — Telehealth: Payer: Self-pay

## 2021-05-26 NOTE — Telephone Encounter (Signed)
[facility] Patient Advocate Encounter  Completed and sent [employer] application for Cabenuva for this patient who is uninsured.    Patient assistance phone number for follow up is [phone].   This encounter will be updated until final determination.   Derrich Gaby, CPhT Specialty Pharmacy Patient [facility] Phone: [phone] Fax:  [phone]  

## 2021-05-27 ENCOUNTER — Other Ambulatory Visit (HOSPITAL_COMMUNITY): Payer: Self-pay

## 2021-06-29 ENCOUNTER — Encounter: Payer: Self-pay | Admitting: Infectious Diseases

## 2021-06-30 ENCOUNTER — Other Ambulatory Visit (HOSPITAL_COMMUNITY): Payer: Self-pay

## 2021-06-30 ENCOUNTER — Telehealth: Payer: Self-pay

## 2021-06-30 NOTE — Telephone Encounter (Signed)
[facility] Patient Advocate Encounter  Completed and sent [employer] application for Cabenuva for this patient who is uninsured.    Patient is approved 06/30/21 through 06/29/22.  Medication will be shipped to the clinic from [facility].   Clearance Coots, CPhT Specialty Pharmacy Patient [facility] for Infect[facility] Disease Phone: [phone] Fax:  [phone]

## 2021-07-01 ENCOUNTER — Telehealth: Payer: Self-pay

## 2021-07-01 NOTE — Telephone Encounter (Signed)
[facility] Patient Advocate Encounter  Patient's medication Renaldo Harrison) have been couriered to [facility] from [employer] and will be administered on patient next office visit on 07/06/21.  Clearance Coots , CPhT Specialty Pharmacy Patient [facility] for [facility] Phone: [phone] Fax:  [phone]

## 2021-07-06 ENCOUNTER — Ambulatory Visit (INDEPENDENT_AMBULATORY_CARE_PROVIDER_SITE_OTHER): Payer: BC Managed Care – PPO | Admitting: Pharmacist

## 2021-07-06 ENCOUNTER — Other Ambulatory Visit: Payer: Self-pay

## 2021-07-06 DIAGNOSIS — B20 Human immunodeficiency virus [HIV] disease: Secondary | ICD-10-CM

## 2021-07-06 DIAGNOSIS — R7989 Other specified abnormal findings of blood chemistry: Secondary | ICD-10-CM

## 2021-07-06 LAB — BASIC METABOLIC PANEL
BUN/Creatinine Ratio: 13 (calc) (ref 6–22)
BUN: 18 mg/dL (ref 7–25)
CO2: 29 mmol/L (ref 20–32)
Calcium: 9.7 mg/dL (ref 8.6–10.3)
Chloride: 106 mmol/L (ref 98–110)
Creat: 1.42 mg/dL — ABNORMAL HIGH (ref 0.60–1.29)
Glucose, Bld: 66 mg/dL (ref 65–99)
Potassium: 4.6 mmol/L (ref 3.5–5.3)
Sodium: 139 mmol/L (ref 135–146)

## 2021-07-06 MED ORDER — CABOTEGRAVIR & RILPIVIRINE ER 600 & 900 MG/3ML IM SUER: 1.0000 | INTRAMUSCULAR | 5 refills | Status: DC

## 2021-07-06 MED ORDER — CABOTEGRAVIR & RILPIVIRINE ER 600 & 900 MG/3ML IM SUER
1.0000 | Freq: Once | INTRAMUSCULAR | Status: AC
  Administered 2021-07-06: 1 via INTRAMUSCULAR

## 2021-07-06 NOTE — Progress Notes (Signed)
HPI: Casey Hurley is a 51 y.o. male who presents to the [place] clinic for [place] administration.  Patient Active Problem List   Diagnosis Date Noted   Insomnia 01/16/2019   Anxiety 01/16/2019   Impotence due to erectile dysfunction 11/12/2018   Alcoholism ([place]) 01/31/2016   Tobacco use disorder 02/22/2015   Molluscum contagiosum infection 10/22/2014   Hyperglycemia 09/11/2014   AIDS ([place]) 08/25/2014   Tachycardia    Essential hypertension 08/24/2014   Genital herpes 08/24/2014   Recurrent pneumonia 08/23/2014   Post-operative state 12/11/2013   Anal condyloma 11/10/2013   Breast abscess in male 07/08/2012    Patient's Medications  New Prescriptions   No medications on file  Previous Medications   ALBUTEROL (VENTOLIN HFA) 108 (90 BASE) MCG/ACT INHALER    INHALE TWO PUFFS PO Q 6 H PRN   ALPRAZOLAM (XANAX) 0.25 MG TABLET    Take 1 tablet (0.25 mg total) by mouth 2 (two) times daily as needed for anxiety.   DRONABINOL (MARINOL) 5 MG CAPSULE    TAKE ONE CAPSULE BY MOUTH TWICE DAILY BEFORE A MEAL   ELVITEGRAVIR-COBICISTAT-EMTRICITABINE-TENOFOVIR (GENVOYA) 150-150-200-10 MG TABS TABLET    TAKE ONE TABLET BY MOUTH ONCE DAILY WITH FOOD. STORE IN ORIGINAL CONTAINER AT ROOM TEMPERATURE.   ELVITEGRAVIR-COBICISTAT-EMTRICITABINE-TENOFOVIR (GENVOYA) 150-150-200-10 MG TABS TABLET    TAKE ONE TABLET BY MOUTH ONCE DAILY WITH FOOD. STORE IN ORIGINAL CONTAINER AT ROOM TEMPERATURE.   FAMOTIDINE (PEPCID) 40 MG TABLET    Take 1 tablet (40 mg total) by mouth daily for 10 days.   FLUCONAZOLE (DIFLUCAN) 200 MG TABLET    TAKE 1 TABLET(200 MG) BY MOUTH DAILY   IMIQUIMOD (ALDARA) 5 % CREAM    Apply topically 3 (three) times a week.   METHYLPREDNISOLONE (MEDROL DOSEPAK) 4 MG TBPK TABLET    5 tab po no day 1, 4 on day 2, 3 on day 3-5   TADALAFIL (CIALIS) 20 MG TABLET    1 tablet   TRAZODONE (DESYREL) 50 MG TABLET    Take 0.5 tablets (25 mg total) by mouth at bedtime for 30 doses.   VALACYCLOVIR  (VALTREX) 500 MG TABLET    Take 1 tablet by mouth daily.  Modified Medications   No medications on file  Discontinued Medications   No medications on file    Allergies: Allergies  Allergen Reactions   Restoril [Temazepam] Other (See Comments)    Violent nightmares    Past Medical History: Past Medical History:  Diagnosis Date   Acquired immune deficiency syndrome ([place])    Genital herpes    HIV (human immunodeficiency virus infection) ([place])    Hypertension    PCP (pneumocystis carinii pneumonia) ([place])     Social History: Social History   Socioeconomic History   Marital status: Married    Spouse name: Not on file   Number of children: Not on file   Years of education: Not on file   Highest education level: Not on file  Occupational History   Not on file  Tobacco Use   Smoking status: Every Day    Packs/day: 1.00    Types: Cigarettes    Start date: 06/02/2014   Smokeless tobacco: Never  Substance and Sexual Activity   Alcohol use: Yes    Alcohol/week: 6.0 standard drinks    Types: 6 Cans of beer per week   Drug use: Yes    Types: Marijuana   Sexual activity: Yes    Partners: Female  Comment: declined condoms  Other Topics Concern   Not on file  Social History Narrative   Not on file   Social Determinants of Health   Financial Resource Strain: Not on file  Food Insecurity: Not on file  Transportation Needs: Not on file  Physical Activity: Not on file  Stress: Not on file  Social Connections: Not on file    Labs: Lab Results  Component Value Date   HIV1RNAQUANT Not Detected 05/03/2021   HIV1RNAQUANT <20 07/16/2020   HIV1RNAQUANT <20 DETECTED (A) 08/13/2019   CD4TABS 193 (L) 05/03/2021   CD4TABS 245 (L) 08/13/2019   CD4TABS 158 (L) 10/29/2018    RPR and STI Lab Results  Component Value Date   LABRPR REACTIVE (A) 05/03/2021   LABRPR REACTIVE (A) 07/27/2020   LABRPR NON-REACTIVE 08/13/2019   LABRPR NON-REACTIVE 10/29/2018   LABRPR  NON-REACTIVE 07/30/2017   RPRTITER 1:1 (H) 05/03/2021   RPRTITER 1:16 (H) 07/27/2020    STI Results GC CT  05/03/2021 Negative Negative  07/27/2020 Negative Negative  08/13/2019 Negative Negative  10/29/2018 Negative Negative  07/30/2017 Negative Negative  01/24/2017 Negative Negative  08/09/2016 Negative Negative  01/20/2016 Negative Negative  08/23/2015 Negative Negative  08/25/2014 NG: Negative CT: Negative    Hepatitis B Lab Results  Component Value Date   HEPBSAB NON-REACTIVE 02/07/2017   HEPBSAG NEGATIVE 08/25/2014   Hepatitis C No results found for: HEPCAB, HCVRNAPCRQN Hepatitis A Lab Results  Component Value Date   HAV NON REACTIVE 08/25/2014   Lipids: Lab Results  Component Value Date   CHOL 194 05/03/2021   TRIG 173 (H) 05/03/2021   HDL 47 05/03/2021   CHOLHDL 4.1 05/03/2021   VLDL 32 (H) 01/24/2017   LDLCALC 118 (H) 05/03/2021    Current HIV Regimen: Genvoya  TARGET DATE: The 11th of the month  Assessment: Casey Hurley presents today for their first initiation injection for Cabenuva. Counseled that [place] is two separate intramuscular injections in the gluteal muscle on each side for each visit. Explained that the second injection is 30 days after the initial injection then every 2 months thereafter. Discussed the need for viral load monitoring every 2 months for the first 6 months and then periodically afterwards as their provider sees the need. Discussed the rare but significant chance of developing resistance despite compliance. Explained that showing up to injection appointments is very important and warned that if 2 appointments are missed, it will be reassessed by their provider whether they are a good candidate for injection therapy. Counseled on possible side effects associated with the injections such as injection site pain, which is usually mild to moderate in nature, injection site nodules, and injection site reactions. Asked to call the clinic or send me a  mychart message if they experience any issues, such as fatigue, nausea, headache, rash, or dizziness. Advised that they can take ibuprofen or tylenol for injection site pain if needed.   Patient had elevated Cr at last visit, will repeat BMET today to assess renal function.  Administered cabotegravir 600mg /30mL in left upper outer quadrant of the gluteal muscle. Administered rilpivirine 900 mg/16mL in the right upper outer quadrant of the gluteal muscle. Monitored patient for 10 minutes after injection. Injections were tolerated well without issue. Counseled to stop taking Genvoya after today's dose and to call with any issues that may arise. Will make follow up appointments for second initiation injection in 30 days and then maintenance injections every 2 months thereafter for 6 months.   Plan: - Stop  [place] injections administered - Second initiation injection scheduled for 08/02/21 - Maintenance injections scheduled for 09/29/21 and 11/29/21 - Check BMET - Call with any issues or questions  Michela Pitcher) [place], PharmD Student

## 2021-07-26 DIAGNOSIS — Z0000 Encounter for general adult medical examination without abnormal findings: Secondary | ICD-10-CM | POA: Diagnosis not present

## 2021-07-26 DIAGNOSIS — Z8042 Family history of malignant neoplasm of prostate: Secondary | ICD-10-CM | POA: Diagnosis not present

## 2021-07-28 ENCOUNTER — Other Ambulatory Visit (HOSPITAL_COMMUNITY): Payer: Self-pay

## 2021-07-28 ENCOUNTER — Other Ambulatory Visit: Payer: Self-pay | Admitting: Infectious Diseases

## 2021-07-28 DIAGNOSIS — B20 Human immunodeficiency virus [HIV] disease: Secondary | ICD-10-CM

## 2021-07-28 MED ORDER — VALACYCLOVIR HCL 500 MG PO TABS
ORAL_TABLET | Freq: Every day | ORAL | 1 refills | Status: DC
  Filled 2021-07-28: qty 90, 90d supply, fill #0

## 2021-07-29 ENCOUNTER — Telehealth: Payer: Self-pay

## 2021-07-29 NOTE — Telephone Encounter (Signed)
[facility] Patient Advocate Encounter  Patient's medication Casey Hurley) have been couriered to [facility] from [employer] and will be administered on patient next office visit on 08/02/21.  Casey Hurley , CPhT Specialty Pharmacy Patient [name] for Infectious Disease Phone: [phone] Fax:  [phone]

## 2021-08-02 ENCOUNTER — Ambulatory Visit (INDEPENDENT_AMBULATORY_CARE_PROVIDER_SITE_OTHER): Payer: BC Managed Care – PPO | Admitting: Pharmacist

## 2021-08-02 ENCOUNTER — Other Ambulatory Visit: Payer: Self-pay

## 2021-08-02 DIAGNOSIS — A630 Anogenital (venereal) warts: Secondary | ICD-10-CM | POA: Diagnosis not present

## 2021-08-02 DIAGNOSIS — R7989 Other specified abnormal findings of blood chemistry: Secondary | ICD-10-CM

## 2021-08-02 DIAGNOSIS — B20 Human immunodeficiency virus [HIV] disease: Secondary | ICD-10-CM | POA: Diagnosis not present

## 2021-08-02 DIAGNOSIS — Z113 Encounter for screening for infections with a predominantly sexual mode of transmission: Secondary | ICD-10-CM | POA: Diagnosis not present

## 2021-08-02 DIAGNOSIS — A539 Syphilis, unspecified: Secondary | ICD-10-CM | POA: Diagnosis not present

## 2021-08-02 MED ORDER — CABOTEGRAVIR & RILPIVIRINE ER 600 & 900 MG/3ML IM SUER
1.0000 | Freq: Once | INTRAMUSCULAR | Status: AC
  Administered 2021-08-02: 1 via INTRAMUSCULAR

## 2021-08-02 NOTE — Progress Notes (Signed)
° °HPI: Casey Hurley is a 51 y.o. male who presents to the [facility] pharmacy clinic for Cabenuva administration. ° °Patient Active Problem List  ° Diagnosis Date Noted  ° Insomnia 01/16/2019  ° Anxiety 01/16/2019  ° Impotence due to erectile dysfunction 11/12/2018  ° Alcoholism ([facility]) 01/31/2016  ° Tobacco use disorder 02/22/2015  ° Molluscum contagiosum infection 10/22/2014  ° Hyperglycemia 09/11/2014  ° AIDS ([facility]) 08/25/2014  ° Tachycardia   ° Essential hypertension 08/24/2014  ° Genital herpes 08/24/2014  ° Recurrent pneumonia 08/23/2014  ° Post-operative state 12/11/2013  ° Anal condyloma 11/10/2013  ° Breast abscess in male 07/08/2012  ° ° °Patient's Medications  °New Prescriptions  ° No medications on file  °Previous Medications  ° ALBUTEROL (VENTOLIN HFA) 108 (90 BASE) MCG/ACT INHALER    INHALE TWO PUFFS PO Q 6 H PRN  ° ALPRAZOLAM (XANAX) 0.25 MG TABLET    Take 1 tablet (0.25 mg total) by mouth 2 (two) times daily as needed for anxiety.  ° CABOTEGRAVIR & RILPIVIRINE ER (CABENUVA) 600 & 900 MG/3ML INJECTION    Inject 1 kit into the muscle every 2 (two) months.  ° DRONABINOL (MARINOL) 5 MG CAPSULE    TAKE ONE CAPSULE BY MOUTH TWICE DAILY BEFORE A MEAL  ° FAMOTIDINE (PEPCID) 40 MG TABLET    Take 1 tablet (40 mg total) by mouth daily for 10 days.  ° FLUCONAZOLE (DIFLUCAN) 200 MG TABLET    TAKE 1 TABLET(200 MG) BY MOUTH DAILY  ° IMIQUIMOD (ALDARA) 5 % CREAM    Apply topically 3 (three) times a week.  ° METHYLPREDNISOLONE (MEDROL DOSEPAK) 4 MG TBPK TABLET    5 tab po no day 1, 4 on day 2, 3 on day 3-5  ° TADALAFIL (CIALIS) 20 MG TABLET    1 tablet  ° TRAZODONE (DESYREL) 50 MG TABLET    Take 0.5 tablets (25 mg total) by mouth at bedtime for 30 doses.  ° VALACYCLOVIR (VALTREX) 500 MG TABLET    Take 1 tablet by mouth daily.  °Modified Medications  ° No medications on file  °Discontinued Medications  ° No medications on file  ° ° °Allergies: °Allergies  °Allergen Reactions  ° Restoril [Temazepam] Other (See Comments)   °  Violent nightmares  ° ° °Past Medical History: °Past Medical History:  °Diagnosis Date  ° Acquired immune deficiency syndrome ([facility])   ° Genital herpes   ° HIV (human immunodeficiency virus infection) ([facility])   ° Hypertension   ° PCP (pneumocystis carinii pneumonia) ([facility])   ° ° °Social History: °Social History  ° °Socioeconomic History  ° Marital status: Married  °  Spouse name: Not on file  ° Number of children: Not on file  ° Years of education: Not on file  ° Highest education level: Not on file  °Occupational History  ° Not on file  °Tobacco Use  ° Smoking status: Every Day  °  Packs/day: 1.00  °  Types: Cigarettes  °  Start date: 06/02/2014  ° Smokeless tobacco: Never  °Substance and Sexual Activity  ° Alcohol use: Yes  °  Alcohol/week: 6.0 standard drinks  °  Types: 6 Cans of beer per week  ° Drug use: Yes  °  Types: Marijuana  ° Sexual activity: Yes  °  Partners: Female  °  Comment: declined condoms  °Other Topics Concern  ° Not on file  °Social History Narrative  ° Not on file  ° °Social Determinants of Health  ° °  Financial Resource Strain: Not on file  °Food Insecurity: Not on file  °Transportation Needs: Not on file  °Physical Activity: Not on file  °Stress: Not on file  °Social Connections: Not on file  ° ° °Labs: °Lab Results  °Component Value Date  ° HIV1RNAQUANT Not Detected 05/03/2021  ° HIV1RNAQUANT <20 07/16/2020  ° HIV1RNAQUANT <20 DETECTED (A) 08/13/2019  ° CD4TABS 193 (L) 05/03/2021  ° CD4TABS 245 (L) 08/13/2019  ° CD4TABS 158 (L) 10/29/2018  ° ° °RPR and STI °Lab Results  °Component Value Date  ° LABRPR REACTIVE (A) 05/03/2021  ° LABRPR REACTIVE (A) 07/27/2020  ° LABRPR NON-REACTIVE 08/13/2019  ° LABRPR NON-REACTIVE 10/29/2018  ° LABRPR NON-REACTIVE 07/30/2017  ° RPRTITER 1:1 (H) 05/03/2021  ° RPRTITER 1:16 (H) 07/27/2020  ° ° °STI Results GC CT  °05/03/2021 Negative Negative  °07/27/2020 Negative Negative  °08/13/2019 Negative Negative  °10/29/2018 Negative Negative  °07/30/2017 Negative Negative   °01/24/2017 Negative Negative  °08/09/2016 Negative Negative  °01/20/2016 Negative Negative  °08/23/2015 Negative Negative  °08/25/2014 NG: Negative CT: Negative  ° ° °Hepatitis B °Lab Results  °Component Value Date  ° HEPBSAB NON-REACTIVE 02/07/2017  ° HEPBSAG NEGATIVE 08/25/2014  ° °Hepatitis C °No results found for: HEPCAB, HCVRNAPCRQN °Hepatitis A °Lab Results  °Component Value Date  ° HAV NON REACTIVE 08/25/2014  ° °Lipids: °Lab Results  °Component Value Date  ° CHOL 194 05/03/2021  ° TRIG 173 (H) 05/03/2021  ° HDL 47 05/03/2021  ° CHOLHDL 4.1 05/03/2021  ° VLDL 32 (H) 01/24/2017  ° LDLCALC 118 (H) 05/03/2021  ° ° °TARGET DATE: °The 11th of the month ° °Assessment: °Casey Hurley presents today for their maintenance Cabenuva injections. Initial set of injections were tolerated well without issues. He did experience some mild pain and intermittent dizziness for a few days post injections.  ° °Administered cabotegravir 600mg/3mL in left upper outer quadrant of the gluteal muscle. Administered rilpivirine 900 mg/3mL in the right upper outer quadrant of the gluteal muscle. Monitored patient for 10 minutes after injection. Injections were tolerated well without issue. Patient will follow up in 2 months for next set of injections.  ° °Of note, SCr still elevated at last visit. Will check again today. Will check a viral load and RPR today as well.  ° °Plan: °- Cabenuva injections administered °- HIV RNA, RPR, BMP today °- Next injections scheduled for 4/6 and 6/6 with me °- Routine follow up on 5/18 with Dr. Hatcher °- Call with any issues or questions ° °Cassie L. Kuppelweiser, PharmD, BCIDP, AAHIVP, CPP °Clinical Pharmacist Practitioner °Infectious Diseases Clinical Pharmacist °[facility] ° °

## 2021-08-03 ENCOUNTER — Encounter: Payer: Self-pay | Admitting: Pharmacist

## 2021-08-04 LAB — HIV-1 RNA QUANT-NO REFLEX-BLD
HIV 1 RNA Quant: NOT DETECTED Copies/mL
HIV-1 RNA Quant, Log: NOT DETECTED Log cps/mL

## 2021-08-04 LAB — RPR: RPR Ser Ql: REACTIVE — AB

## 2021-08-04 LAB — BASIC METABOLIC PANEL
BUN: 14 mg/dL (ref 7–25)
CO2: 28 mmol/L (ref 20–32)
Calcium: 9.7 mg/dL (ref 8.6–10.3)
Chloride: 102 mmol/L (ref 98–110)
Creat: 0.99 mg/dL (ref 0.60–1.29)
Glucose, Bld: 84 mg/dL (ref 65–99)
Potassium: 4.5 mmol/L (ref 3.5–5.3)
Sodium: 138 mmol/L (ref 135–146)

## 2021-08-04 LAB — RPR TITER: RPR Titer: 1:1 {titer} — ABNORMAL HIGH

## 2021-08-04 LAB — FLUORESCENT TREPONEMAL AB(FTA)-IGG-BLD: Fluorescent Treponemal ABS: REACTIVE — AB

## 2021-09-20 ENCOUNTER — Other Ambulatory Visit (HOSPITAL_COMMUNITY): Payer: Self-pay

## 2021-09-27 ENCOUNTER — Other Ambulatory Visit (HOSPITAL_COMMUNITY): Payer: Self-pay

## 2021-09-27 ENCOUNTER — Telehealth: Payer: Self-pay

## 2021-09-27 NOTE — Telephone Encounter (Signed)
[facility] Patient Advocate Encounter ? ?Casey Hurley is covered under patient medical benefits.  ?J-Code Q3335 and CPT-Code Z941386 do not require a PA .  ?Ref # [id number] ? ?Patient will need to call insurance to see what is his office copay and admin fee.  ? ?Patient is enrolled in [employer] Portal Claims  ? ? ? ? ? ? ?Clearance Coots, CPhT ?Specialty Pharmacy Patient Advocate ?[facility] ?Phone: [phone] ?Fax:  [phone]  ?

## 2021-09-29 ENCOUNTER — Other Ambulatory Visit: Payer: Self-pay

## 2021-09-29 ENCOUNTER — Ambulatory Visit (INDEPENDENT_AMBULATORY_CARE_PROVIDER_SITE_OTHER): Payer: BC Managed Care – PPO | Admitting: Pharmacist

## 2021-09-29 DIAGNOSIS — B20 Human immunodeficiency virus [HIV] disease: Secondary | ICD-10-CM | POA: Diagnosis not present

## 2021-09-29 MED ORDER — CABOTEGRAVIR & RILPIVIRINE ER 600 & 900 MG/3ML IM SUER
1.0000 | Freq: Once | INTRAMUSCULAR | Status: AC
  Administered 2021-09-29: 1 via INTRAMUSCULAR

## 2021-09-29 NOTE — Progress Notes (Signed)
? ?HPI: Casey Hurley is a 51 y.o. male who presents to the [facility] for [place] administration. ? ?Patient Active Problem List  ? Diagnosis Date Noted  ? Insomnia 01/16/2019  ? Anxiety 01/16/2019  ? Impotence due to erectile dysfunction 11/12/2018  ? Alcoholism ([place]) 01/31/2016  ? Tobacco use disorder 02/22/2015  ? Molluscum contagiosum infection 10/22/2014  ? Hyperglycemia 09/11/2014  ? AIDS ([place]) 08/25/2014  ? Tachycardia   ? Essential hypertension 08/24/2014  ? Genital herpes 08/24/2014  ? Recurrent pneumonia 08/23/2014  ? Post-operative state 12/11/2013  ? Anal condyloma 11/10/2013  ? Breast abscess in male 07/08/2012  ? ? ?Patient's Medications  ?New Prescriptions  ? No medications on file  ?Previous Medications  ? ALBUTEROL (VENTOLIN HFA) 108 (90 BASE) MCG/ACT INHALER    INHALE TWO PUFFS PO Q 6 H PRN  ? ALPRAZOLAM (XANAX) 0.25 MG TABLET    Take 1 tablet (0.25 mg total) by mouth 2 (two) times daily as needed for anxiety.  ? CABOTEGRAVIR & RILPIVIRINE ER (CABENUVA) 600 & 900 MG/3ML INJECTION    Inject 1 kit into the muscle every 2 (two) months.  ? DRONABINOL (MARINOL) 5 MG CAPSULE    TAKE ONE CAPSULE BY MOUTH TWICE DAILY BEFORE A MEAL  ? FAMOTIDINE (PEPCID) 40 MG TABLET    Take 1 tablet (40 mg total) by mouth daily for 10 days.  ? FLUCONAZOLE (DIFLUCAN) 200 MG TABLET    TAKE 1 TABLET(200 MG) BY MOUTH DAILY  ? IMIQUIMOD (ALDARA) 5 % CREAM    Apply topically 3 (three) times a week.  ? METHYLPREDNISOLONE (MEDROL DOSEPAK) 4 MG TBPK TABLET    5 tab po no day 1, 4 on day 2, 3 on day 3-5  ? TADALAFIL (CIALIS) 20 MG TABLET    1 tablet  ? TRAZODONE (DESYREL) 50 MG TABLET    Take 0.5 tablets (25 mg total) by mouth at bedtime for 30 doses.  ? VALACYCLOVIR (VALTREX) 500 MG TABLET    Take 1 tablet by mouth daily.  ?Modified Medications  ? No medications on file  ?Discontinued Medications  ? No medications on file  ? ? ?Allergies: ?Allergies  ?Allergen Reactions  ? Restoril [Temazepam] Other (See Comments)   ?  Violent nightmares  ? ? ?Past Medical History: ?Past Medical History:  ?Diagnosis Date  ? Acquired immune deficiency syndrome ([place])   ? Genital herpes   ? HIV (human immunodeficiency virus infection) ([place])   ? Hypertension   ? PCP (pneumocystis carinii pneumonia) ([place])   ? ? ?Social History: ?Social History  ? ?Socioeconomic History  ? Marital status: Married  ?  Spouse name: Not on file  ? Number of children: Not on file  ? Years of education: Not on file  ? Highest education level: Not on file  ?Occupational History  ? Not on file  ?Tobacco Use  ? Smoking status: Every Day  ?  Packs/day: 1.00  ?  Types: Cigarettes  ?  Start date: 06/02/2014  ? Smokeless tobacco: Never  ?Substance and Sexual Activity  ? Alcohol use: Yes  ?  Alcohol/week: 6.0 standard drinks  ?  Types: 6 Cans of beer per week  ? Drug use: Yes  ?  Types: Marijuana  ? Sexual activity: Yes  ?  Partners: Female  ?  Comment: declined condoms  ?Other Topics Concern  ? Not on file  ?Social History Narrative  ? Not on file  ? ?Social Determinants of Health  ? ?  Financial Resource Strain: Not on file  ?Food Insecurity: Not on file  ?Transportation Needs: Not on file  ?Physical Activity: Not on file  ?Stress: Not on file  ?Social Connections: Not on file  ? ? ?Labs: ?Lab Results  ?Component Value Date  ? HIV1RNAQUANT Not Detected 08/02/2021  ? HIV1RNAQUANT Not Detected 05/03/2021  ? HIV1RNAQUANT <20 07/16/2020  ? CD4TABS 193 (L) 05/03/2021  ? CD4TABS 245 (L) 08/13/2019  ? CD4TABS 158 (L) 10/29/2018  ? ? ?RPR and STI ?Lab Results  ?Component Value Date  ? LABRPR REACTIVE (A) 08/02/2021  ? LABRPR REACTIVE (A) 05/03/2021  ? LABRPR REACTIVE (A) 07/27/2020  ? LABRPR NON-REACTIVE 08/13/2019  ? LABRPR NON-REACTIVE 10/29/2018  ? RPRTITER 1:1 (H) 08/02/2021  ? RPRTITER 1:1 (H) 05/03/2021  ? RPRTITER 1:16 (H) 07/27/2020  ? ? ?STI Results GC CT  ?05/03/2021 ? 4:16 PM Negative   Negative    ?07/27/2020 ? 9:23 AM Negative   Negative    ?08/13/2019 ?12:00 AM Negative    Negative    ?10/29/2018 ?12:00 AM Negative   Negative    ?07/30/2017 ?12:00 AM Negative   Negative    ?01/24/2017 ?12:00 AM Negative   Negative    ?08/09/2016 ?12:00 AM Negative   Negative    ?01/20/2016 ?12:00 AM Negative   Negative    ?08/23/2015 ?12:00 AM Negative   Negative    ?08/25/2014 ?12:00 AM NG: Negative   CT: Negative    ? ? ?Hepatitis B ?Lab Results  ?Component Value Date  ? HEPBSAB NON-REACTIVE 02/07/2017  ? HEPBSAG NEGATIVE 08/25/2014  ? ?Hepatitis C ?No results found for: [place], HCVRNAPCRQN ?Hepatitis A ?Lab Results  ?Component Value Date  ? HAV NON REACTIVE 08/25/2014  ? ?Lipids: ?Lab Results  ?Component Value Date  ? CHOL 194 05/03/2021  ? TRIG 173 (H) 05/03/2021  ? HDL 47 05/03/2021  ? CHOLHDL 4.1 05/03/2021  ? VLDL 32 (H) 01/24/2017  ? LDLCALC 118 (H) 05/03/2021  ? ? ?TARGET DATE: ?The 11th ? ?Assessment: ?Lonzell presents today for their maintenance Cabenuva injections. Past injections were tolerated well without issues. He did experience mild pain for a few days after the injections. ? ?Administered cabotegravir 66m/3mL in left upper outer quadrant of the gluteal muscle. Administered rilpivirine 900 mg/357min the right upper outer quadrant of the gluteal muscle. Injections were tolerated well. Patient will follow up in 2 months for next set of injections. ? ?Plan: ?- Cabenuva injections administered ?- HIV RNA today ?-  F/up for lab work 10/27/21 and with Dr. HaJohnnye Siman 11/10/21 ?- Next injections scheduled for 11/29/21 with me ?- Call with any issues or questions ? ?Luzmaria Devaux L. Alijah Akram, PharmD, BCIDP, AAHIVP, CPP ?Clinical Pharmacist Practitioner ?Infectious Diseases Clinical Pharmacist ?[facility]  ?

## 2021-10-01 LAB — HIV-1 RNA QUANT-NO REFLEX-BLD
HIV 1 RNA Quant: NOT DETECTED copies/mL
HIV-1 RNA Quant, Log: NOT DETECTED Log copies/mL

## 2021-10-27 ENCOUNTER — Other Ambulatory Visit: Payer: Self-pay

## 2021-10-27 ENCOUNTER — Other Ambulatory Visit (HOSPITAL_COMMUNITY)
Admission: RE | Admit: 2021-10-27 | Discharge: 2021-10-27 | Disposition: A | Discharge status: Home / Self Care | Payer: BC Managed Care – PPO | Source: Ambulatory Visit | Attending: Infectious Diseases | Admitting: Infectious Diseases

## 2021-10-27 ENCOUNTER — Other Ambulatory Visit: Payer: BC Managed Care – PPO

## 2021-10-27 DIAGNOSIS — Z113 Encounter for screening for infections with a predominantly sexual mode of transmission: Secondary | ICD-10-CM | POA: Insufficient documentation

## 2021-10-27 DIAGNOSIS — Z79899 Other long term (current) drug therapy: Secondary | ICD-10-CM

## 2021-10-27 DIAGNOSIS — B20 Human immunodeficiency virus [HIV] disease: Secondary | ICD-10-CM | POA: Diagnosis not present

## 2021-10-28 LAB — URINE CYTOLOGY ANCILLARY ONLY
Chlamydia: NEGATIVE
Comment: NEGATIVE
Comment: NORMAL
Neisseria Gonorrhea: NEGATIVE

## 2021-10-28 LAB — T-HELPER CELL (CD4) - (RCID CLINIC ONLY)
CD4 % Helper T Cell: 16 % — ABNORMAL LOW (ref 33–65)
CD4 T Cell Abs: 228 /uL — ABNORMAL LOW (ref 400–1790)

## 2021-11-01 LAB — CBC
HCT: 40.8 % (ref 38.5–50.0)
Hemoglobin: 14.1 g/dL (ref 13.2–17.1)
MCH: 34.5 pg — ABNORMAL HIGH (ref 27.0–33.0)
MCHC: 34.6 g/dL (ref 32.0–36.0)
MCV: 99.8 fL (ref 80.0–100.0)
MPV: 10.1 fL (ref 7.5–12.5)
Platelets: 234 10*3/uL (ref 140–400)
RBC: 4.09 10*6/uL — ABNORMAL LOW (ref 4.20–5.80)
RDW: 12.5 % (ref 11.0–15.0)
WBC: 5 10*3/uL (ref 3.8–10.8)

## 2021-11-01 LAB — LIPID PANEL
Cholesterol: 168 mg/dL (ref <200)
HDL: 39 mg/dL — ABNORMAL LOW (ref >=40)
LDL Cholesterol (Calc): 99 mg/dL (calc)
Non-HDL Cholesterol (Calc): 129 mg/dL (calc) (ref <130)
Total CHOL/HDL Ratio: 4.3 (calc) (ref <5.0)
Triglycerides: 204 mg/dL — ABNORMAL HIGH (ref <150)

## 2021-11-01 LAB — COMPREHENSIVE METABOLIC PANEL
AG Ratio: 1.8 (calc) (ref 1.0–2.5)
ALT: 12 U/L (ref 9–46)
AST: 18 U/L (ref 10–40)
Albumin: 4.1 g/dL (ref 3.6–5.1)
Alkaline phosphatase (APISO): 79 U/L (ref 36–130)
BUN: 12 mg/dL (ref 7–25)
CO2: 29 mmol/L (ref 20–32)
Calcium: 8.8 mg/dL (ref 8.6–10.3)
Chloride: 106 mmol/L (ref 98–110)
Creat: 1.14 mg/dL (ref 0.60–1.29)
Globulin: 2.3 g/dL (calc) (ref 1.9–3.7)
Glucose, Bld: 122 mg/dL — ABNORMAL HIGH (ref 65–99)
Potassium: 4.3 mmol/L (ref 3.5–5.3)
Sodium: 141 mmol/L (ref 135–146)
Total Bilirubin: 0.5 mg/dL (ref 0.2–1.2)
Total Protein: 6.4 g/dL (ref 6.1–8.1)

## 2021-11-01 LAB — HIV-1 RNA QUANT-NO REFLEX-BLD
HIV 1 RNA Quant: NOT DETECTED copies/mL
HIV-1 RNA Quant, Log: NOT DETECTED Log copies/mL

## 2021-11-01 LAB — RPR: RPR Ser Ql: NONREACTIVE

## 2021-11-10 ENCOUNTER — Other Ambulatory Visit: Payer: Self-pay

## 2021-11-10 ENCOUNTER — Encounter: Payer: Self-pay | Admitting: Infectious Diseases

## 2021-11-10 ENCOUNTER — Ambulatory Visit (INDEPENDENT_AMBULATORY_CARE_PROVIDER_SITE_OTHER): Payer: BC Managed Care – PPO | Admitting: Infectious Diseases

## 2021-11-10 DIAGNOSIS — F17200 Nicotine dependence, unspecified, uncomplicated: Secondary | ICD-10-CM | POA: Diagnosis not present

## 2021-11-10 DIAGNOSIS — I10 Essential (primary) hypertension: Secondary | ICD-10-CM

## 2021-11-10 DIAGNOSIS — B20 Human immunodeficiency virus [HIV] disease: Secondary | ICD-10-CM | POA: Diagnosis not present

## 2021-11-10 NOTE — Progress Notes (Signed)
   Subjective:    Patient ID: Casey Hurley, male  DOB: 07/28/1970, 51 y.o.        MRN: [id number]   HPI 51 yo M dx AIDS/PCP when adm to [facility] 2-28 to 08-28-14. Was started on genvoya. His father died of lung cancer June 11, 2020.    At visit 2022 c/o diffuse rash. 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  He was changed to cabaneuva 04-2021.  Doing well.  No complaints today. Here with wife.  Has had all his upper teeth pulled. Going to new dentist, oral surgeon Had perf into sinus, has f/u procedures. On amox for this.  Continues to smoke.    HIV 1 RNA Quant  Date Value  10/27/2021 NOT DETECTED copies/mL  09/29/2021 NOT DETECTED copies/mL  08/02/2021 Not Detected Copies/mL   CD4 T Cell Abs (/uL)  Date Value  10/27/2021 228 (L)  05/03/2021 193 (L)  08/13/2019 245 (L)     Health Maintenance  Topic Date Due  . COLONOSCOPY (Pts 45-35yrs Insurance coverage will need to be confirmed)  Never done  . COVID-19 Vaccine (3 - Pfizer risk series) 03/10/2020  . INFLUENZA VACCINE  01/24/2022  . TETANUS/TDAP  11/11/2028  . Hepatitis C Screening  Completed  . HIV Screening  Completed  . HPV VACCINES  Aged Out      Review of Systems  Constitutional:  Negative for chills, fever and weight loss.  Respiratory:  Negative for cough and shortness of breath.   Gastrointestinal:  Negative for constipation and diarrhea.  Genitourinary:  Negative for dysuria.   Please see HPI. All other systems reviewed and negative.     Objective:  Physical Exam Vitals reviewed.  Constitutional:      Appearance: Normal appearance.  HENT:     Mouth/Throat:     Mouth: Mucous membranes are moist.     Pharynx: No oropharyngeal exudate.  Eyes:     Extraocular Movements: Extraocular movements intact.     Pupils: Pupils are equal, round, and reactive to light.  Cardiovascular:     Rate and Rhythm: Normal rate and regular rhythm.  Pulmonary:     Effort: Pulmonary effort is normal.     Breath  sounds: Normal breath sounds.  Abdominal:     General: Bowel sounds are normal. There is no distension.     Palpations: Abdomen is soft.     Tenderness: There is no abdominal tenderness.  Musculoskeletal:        General: Normal range of motion.     Cervical back: Normal range of motion and neck supple.     Right lower leg: No edema.     Left lower leg: No edema.  Neurological:     General: No focal deficit present.     Mental Status: He is alert.          Assessment & Plan:

## 2021-11-10 NOTE — Assessment & Plan Note (Signed)
Encouraged to quit. 

## 2021-11-10 NOTE — Assessment & Plan Note (Signed)
He is doing well on cabaneuva My great appreciation to pharmacy I am grateful that he is here with his wife.  We reviewed his labs.  Will plan on seeing him back in 3 months.

## 2021-11-10 NOTE — Assessment & Plan Note (Signed)
Normotensive in clinic today On no anti-htn rx.

## 2021-11-17 ENCOUNTER — Encounter: Payer: Self-pay | Admitting: Infectious Diseases

## 2021-11-29 ENCOUNTER — Other Ambulatory Visit: Payer: Self-pay

## 2021-11-29 ENCOUNTER — Ambulatory Visit (INDEPENDENT_AMBULATORY_CARE_PROVIDER_SITE_OTHER): Payer: BC Managed Care – PPO | Admitting: Pharmacist

## 2021-11-29 DIAGNOSIS — B20 Human immunodeficiency virus [HIV] disease: Secondary | ICD-10-CM | POA: Diagnosis not present

## 2021-11-29 MED ORDER — CABOTEGRAVIR & RILPIVIRINE ER 600 & 900 MG/3ML IM SUER
1.0000 | Freq: Once | INTRAMUSCULAR | Status: AC
  Administered 2021-11-29: 1 via INTRAMUSCULAR

## 2021-11-29 NOTE — Progress Notes (Signed)
HPI: Casey Hurley is a 51 y.o. male who presents to the [facility] for [place] administration.  Patient Active Problem List   Diagnosis Date Noted   Insomnia 01/16/2019   Anxiety 01/16/2019   Impotence due to erectile dysfunction 11/12/2018   Alcoholism ([place]) 01/31/2016   Tobacco use disorder 02/22/2015   Molluscum contagiosum infection 10/22/2014   Hyperglycemia 09/11/2014   AIDS ([place]) 08/25/2014   Tachycardia    Essential hypertension 08/24/2014   Genital herpes 08/24/2014   Recurrent pneumonia 08/23/2014   Post-operative state 12/11/2013   Anal condyloma 11/10/2013   Breast abscess in male 07/08/2012    Patient's Medications  New Prescriptions   No medications on file  Previous Medications   ALBUTEROL (VENTOLIN HFA) 108 (90 BASE) MCG/ACT INHALER       ALPRAZOLAM (XANAX) 0.25 MG TABLET    Take 1 tablet (0.25 mg total) by mouth 2 (two) times daily as needed for anxiety.   CABOTEGRAVIR & RILPIVIRINE ER (CABENUVA) 600 & 900 MG/3ML INJECTION    Inject 1 kit into the muscle every 2 (two) months.   DRONABINOL (MARINOL) 5 MG CAPSULE    TAKE ONE CAPSULE BY MOUTH TWICE DAILY BEFORE A MEAL   FAMOTIDINE (PEPCID) 40 MG TABLET    Take 1 tablet (40 mg total) by mouth daily for 10 days.   FLUCONAZOLE (DIFLUCAN) 200 MG TABLET    TAKE 1 TABLET(200 MG) BY MOUTH DAILY   IMIQUIMOD (ALDARA) 5 % CREAM    Apply topically 3 (three) times a week.   TADALAFIL (CIALIS) 20 MG TABLET    1 tablet   TRAZODONE (DESYREL) 50 MG TABLET    Take 0.5 tablets (25 mg total) by mouth at bedtime for 30 doses.   VALACYCLOVIR (VALTREX) 500 MG TABLET    Take 1 tablet by mouth daily.  Modified Medications   No medications on file  Discontinued Medications   No medications on file    Allergies: Allergies  Allergen Reactions   Restoril [Temazepam] Other (See Comments)    Violent nightmares    Past Medical History: Past Medical History:  Diagnosis Date   Acquired immune deficiency syndrome  ([place])    Genital herpes    HIV (human immunodeficiency virus infection) ([place])    Hypertension    PCP (pneumocystis carinii pneumonia) ([place])     Social History: Social History   Socioeconomic History   Marital status: Married    Spouse name: Not on file   Number of children: Not on file   Years of education: Not on file   Highest education level: Not on file  Occupational History   Not on file  Tobacco Use   Smoking status: Every Day    Packs/day: 1.00    Types: Cigarettes    Start date: 06/02/2014   Smokeless tobacco: Never  Substance and Sexual Activity   Alcohol use: Yes    Alcohol/week: 6.0 standard drinks    Types: 6 Cans of beer per week   Drug use: Yes    Types: Marijuana   Sexual activity: Yes    Partners: Female    Comment: declined condoms  Other Topics Concern   Not on file  Social History Narrative   Not on file   Social Determinants of Health   Financial Resource Strain: Not on file  Food Insecurity: Not on file  Transportation Needs: Not on file  Physical Activity: Not on file  Stress: Not on file  Social Connections: Not  on file    Labs: Lab Results  Component Value Date   HIV1RNAQUANT NOT DETECTED 10/27/2021   HIV1RNAQUANT NOT DETECTED 09/29/2021   HIV1RNAQUANT Not Detected 08/02/2021   CD4TABS 228 (L) 10/27/2021   CD4TABS 193 (L) 05/03/2021   CD4TABS 245 (L) 08/13/2019    RPR and STI Lab Results  Component Value Date   LABRPR NON-REACTIVE 10/27/2021   LABRPR REACTIVE (A) 08/02/2021   LABRPR REACTIVE (A) 05/03/2021   LABRPR REACTIVE (A) 07/27/2020   LABRPR NON-REACTIVE 08/13/2019   RPRTITER 1:1 (H) 08/02/2021   RPRTITER 1:1 (H) 05/03/2021   RPRTITER 1:16 (H) 07/27/2020    STI Results GC CT  10/27/2021  3:44 PM Negative   Negative    05/03/2021  4:16 PM Negative   Negative    07/27/2020  9:23 AM Negative   Negative    08/13/2019 12:00 AM Negative   Negative    10/29/2018 12:00 AM Negative   Negative    07/30/2017 12:00 AM Negative    Negative    01/24/2017 12:00 AM Negative   Negative    08/09/2016 12:00 AM Negative   Negative    01/20/2016 12:00 AM Negative   Negative    08/23/2015 12:00 AM Negative   Negative    08/25/2014 12:00 AM NG: Negative   CT: Negative      Hepatitis B Lab Results  Component Value Date   HEPBSAB NON-REACTIVE 02/07/2017   HEPBSAG NEGATIVE 08/25/2014   Hepatitis C No results found for: HEPCAB, HCVRNAPCRQN Hepatitis A Lab Results  Component Value Date   HAV NON REACTIVE 08/25/2014   Lipids: Lab Results  Component Value Date   CHOL 168 10/27/2021   TRIG 204 (H) 10/27/2021   HDL 39 (L) 10/27/2021   CHOLHDL 4.3 10/27/2021   VLDL 32 (H) 01/24/2017   LDLCALC 99 10/27/2021    TARGET DATE: The 11th  Assessment: Casey Hurley presents today for his maintenance Cabenuva injections. Past injections were tolerated well without issues. Recently saw Dr. Johnnye Sima and plans to follow him to his new clinic after this visit. I advised him that he would no longer be seen here for his injections but scheduled him for his August injection just to make sure it is taken care of. He does know that we cannot do this long term if he leaves the [facility].  Administered cabotegravir 650m/3mL in left upper outer quadrant of the gluteal muscle. Administered rilpivirine 900 mg/374min the right upper outer quadrant of the gluteal muscle. Injections were tolerated well. Patient will follow up in 2 months for next set of injections.  Plan: - Cabenuva injections administered - Next injections scheduled for 01/31/22 with me (as long as he doesn't get established with Dr. HaJohnnye Sima- Call with any issues or questions  Eileene Kisling L. Christorpher Hisaw, PharmD, BCIDP, AAHIVP, CPStandardlinical Pharmacist Practitioner [facility]

## 2021-12-30 DIAGNOSIS — N611 Abscess of the breast and nipple: Secondary | ICD-10-CM | POA: Diagnosis not present

## 2022-01-23 ENCOUNTER — Encounter: Payer: Self-pay | Admitting: Pharmacist

## 2022-01-24 ENCOUNTER — Ambulatory Visit: Payer: BC Managed Care – PPO | Admitting: Family

## 2022-01-31 ENCOUNTER — Other Ambulatory Visit: Payer: Self-pay

## 2022-01-31 ENCOUNTER — Encounter: Payer: BC Managed Care – PPO | Admitting: Pharmacist

## 2022-01-31 ENCOUNTER — Encounter: Payer: Self-pay | Admitting: Family

## 2022-01-31 ENCOUNTER — Ambulatory Visit (INDEPENDENT_AMBULATORY_CARE_PROVIDER_SITE_OTHER): Payer: BC Managed Care – PPO | Admitting: Family

## 2022-01-31 VITALS — BP 125/74 | HR 109 | Temp 98.3°F | Ht 71.0 in | Wt 134.0 lb

## 2022-01-31 DIAGNOSIS — B20 Human immunodeficiency virus [HIV] disease: Secondary | ICD-10-CM

## 2022-01-31 DIAGNOSIS — F17200 Nicotine dependence, unspecified, uncomplicated: Secondary | ICD-10-CM

## 2022-01-31 DIAGNOSIS — Z0000 Encounter for general adult medical examination without abnormal findings: Secondary | ICD-10-CM | POA: Diagnosis not present

## 2022-01-31 DIAGNOSIS — I10 Essential (primary) hypertension: Secondary | ICD-10-CM | POA: Diagnosis not present

## 2022-01-31 MED ORDER — CABOTEGRAVIR & RILPIVIRINE ER 600 & 900 MG/3ML IM SUER
1.0000 | Freq: Once | INTRAMUSCULAR | Status: AC
  Administered 2022-01-31: 1 via INTRAMUSCULAR

## 2022-01-31 NOTE — Assessment & Plan Note (Signed)
Blood pressure well controlled and not currently on medication. Continue to monitor.

## 2022-01-31 NOTE — Patient Instructions (Addendum)
Nice to see you.  Plan for follow up in 2 months for your next injection and 6 months for routine follow up.  Have a great day and stay safe!

## 2022-01-31 NOTE — Assessment & Plan Note (Signed)
Casey Hurley continues to have well controlled virus with good adherence and tolerance to [place]. Reviewed previous lab work and discussed plan of care. Discussed availability to go back on oral medication as an option if he chooses and wishes to stay with injectables at this time. Cabenuva administered without complication. Next injection in 2 months and follow up with provider in 6 months or sooner if needed.

## 2022-01-31 NOTE — Assessment & Plan Note (Signed)
   Discussed importance of safe sexual practice and condom use. Condoms offered.   Prenvar20 in 1 year.   Routine dental care up to date per recommendations.   Due for colon cancer screening and will schedule.

## 2022-01-31 NOTE — Progress Notes (Signed)
Brief Narrative   Patient ID: Casey Hurley, male    DOB: [birth year], 51 y.o.   MRN: [id number]  Casey Hurley is a 51 y/o caucasian male diagnosed with HIV disease in February 2016 with risk factor of sexual contact. Initial viral load of 80,520 with CD4 count of 20. No Genosure available for review. [id number] negative. No history of opportunistic infection. Initial ART of Genvoya and transitioned to [place]. Entered care at [facility] Stage 3.   Subjective:    Chief Complaint  Patient presents with   Follow-up    HPI:  Casey Hurley is a 51 y.o. male with HIV/AIDS last seen on 11/10/21 by Casey Hurley with well controlled virus and good adherence and tolerance to his ART regimen of Cabenuva. Previous injection was 09/29/21. Viral load was undetectable and CD4 count 228. Kidney function, liver function and electrolytes within normal ranges. Lipid profile with triglycerides 204, LDL 99 and HDL 39. In the interim was seen by General Surgery for nipple abscess s/p I&D and treated with Bactrim. Here today for follow up and Cabenuva injection.   Casey Hurley has been doing well since his last office visit. Soreness following injection lasting about 1 week at a time with no other adverse side effects. No new concerns/complaints. Denies fevers, chills, night sweats, headaches, changes in vision, neck pain/stiffness, nausea, diarrhea, vomiting, lesions or rashes. Drinks alcohol and smokes marijuana on occasion with daily tobacco use. Condoms offered. Routine dental care up to date per recommendations. Due for colon cancer screening.   Casey Hurley remains covered by [facility]. Denies feelings of being down, depressed or hopeless recently.   Allergies  Allergen Reactions   Restoril [Temazepam] Other (See Comments)    Violent nightmares      Outpatient Medications Prior to Visit  Medication Sig Dispense Refill   albuterol (VENTOLIN HFA) 108 (90 Base) MCG/ACT inhaler      ALPRAZolam (XANAX)  0.25 MG tablet Take 1 tablet (0.25 mg total) by mouth 2 (two) times daily as needed for anxiety. 10 tablet 0   cabotegravir & rilpivirine ER (CABENUVA) 600 & 900 MG/3ML injection Inject 1 kit into the muscle every 2 (two) months. 6 mL 5   tadalafil (CIALIS) 20 MG tablet      valACYclovir (VALTREX) 500 MG tablet Take 1 tablet by mouth daily. 90 tablet 1   dronabinol (MARINOL) 5 MG capsule TAKE ONE CAPSULE BY MOUTH TWICE DAILY BEFORE A MEAL (Patient not taking: Reported on 01/31/2022) 180 capsule 0   famotidine (PEPCID) 40 MG tablet Take 1 tablet (40 mg total) by mouth daily for 10 days. 10 tablet 0   fluconazole (DIFLUCAN) 200 MG tablet TAKE 1 TABLET(200 MG) BY MOUTH DAILY (Patient not taking: Reported on 01/31/2022) 14 tablet 2   imiquimod (ALDARA) 5 % cream Apply topically 3 (three) times a week. (Patient not taking: Reported on 08/26/2019) 12 each 1   traZODone (DESYREL) 50 MG tablet Take 0.5 tablets (25 mg total) by mouth at bedtime for 30 doses. (Patient not taking: Reported on 05/24/2021) 15 tablet 0   No facility-administered medications prior to visit.     Past Medical History:  Diagnosis Date   Acquired immune deficiency syndrome ([place])    Genital herpes    HIV (human immunodeficiency virus infection) ([place])    Hypertension    PCP (pneumocystis carinii pneumonia) ([place])      Past Surgical History:  Procedure Laterality Date   ANAL EXAMINATION UNDER ANESTHESIA  anal condylomas   VIDEO BRONCHOSCOPY Bilateral 08/25/2014   Procedure: VIDEO BRONCHOSCOPY WITHOUT FLUORO;  Surgeon: Collene Gobble, MD;  Location: [facility];  Service: Cardiopulmonary;  Laterality: Bilateral;      Review of Systems  Constitutional:  Negative for appetite change, chills, fatigue, fever and unexpected weight change.  Eyes:  Negative for visual disturbance.  Respiratory:  Negative for cough, chest tightness, shortness of breath and wheezing.   Cardiovascular:  Negative for chest pain and leg swelling.   Gastrointestinal:  Negative for abdominal pain, constipation, diarrhea, nausea and vomiting.  Genitourinary:  Negative for dysuria, flank pain, frequency, genital sores, hematuria and urgency.  Skin:  Negative for rash.  Allergic/Immunologic: Negative for immunocompromised state.  Neurological:  Negative for dizziness and headaches.      Objective:    BP 125/74   Pulse (!) 109   Temp 98.3 F (36.8 C) (Oral)   Ht 5' 11"  (1.803 m)   Wt 134 lb (60.8 kg)   SpO2 97%   BMI 18.69 kg/m  Nursing note and vital signs reviewed.  Physical Exam Constitutional:      General: He is not in acute distress.    Appearance: He is well-developed.  Eyes:     Conjunctiva/sclera: Conjunctivae normal.  Cardiovascular:     Rate and Rhythm: Normal rate and regular rhythm.     Heart sounds: Normal heart sounds. No murmur heard.    No friction rub. No gallop.  Pulmonary:     Effort: Pulmonary effort is normal. No respiratory distress.     Breath sounds: Normal breath sounds. No wheezing or rales.  Chest:     Chest wall: No tenderness.  Abdominal:     General: Bowel sounds are normal.     Palpations: Abdomen is soft.     Tenderness: There is no abdominal tenderness.  Musculoskeletal:     Cervical back: Neck supple.  Lymphadenopathy:     Cervical: No cervical adenopathy.  Skin:    General: Skin is warm and dry.     Findings: No rash.  Neurological:     Mental Status: He is alert and oriented to person, place, and time.  Psychiatric:        Behavior: Behavior normal.        Thought Content: Thought content normal.        Judgment: Judgment normal.         01/31/2022    3:57 PM 05/24/2021    4:03 PM 07/27/2020    8:56 AM 08/26/2019    3:55 PM 01/16/2019    4:03 PM  Depression screen PHQ 2/9  Decreased Interest 0 0 0 0 0  Down, Depressed, Hopeless 0 0 0 1 0  PHQ - 2 Score 0 0 0 1 0       Assessment & Plan:    Patient Active Problem List   Diagnosis Date Noted   Healthcare  maintenance 01/31/2022   Insomnia 01/16/2019   Anxiety 01/16/2019   Impotence due to erectile dysfunction 11/12/2018   Alcoholism ([place]) 01/31/2016   Tobacco use disorder 02/22/2015   Molluscum contagiosum infection 10/22/2014   Hyperglycemia 09/11/2014   AIDS ([place]) 08/25/2014   Tachycardia    Essential hypertension 08/24/2014   Genital herpes 08/24/2014   Recurrent pneumonia 08/23/2014   Post-operative state 12/11/2013   Anal condyloma 11/10/2013   Breast abscess in male 07/08/2012     Problem List Items Addressed This Visit       Cardiovascular and  Mediastinum   Essential hypertension    Blood pressure well controlled and not currently on medication. Continue to monitor.         Other   AIDS [facility]) - Primary    Casey Hurley continues to have well controlled virus with good adherence and tolerance to [place]. Reviewed previous lab work and discussed plan of care. Discussed availability to go back on oral medication as an option if he chooses and wishes to stay with injectables at this time. Cabenuva administered without complication. Next injection in 2 months and follow up with provider in 6 months or sooner if needed.      Relevant Medications   cabotegravir & rilpivirine ER (CABENUVA) 600 & 900 MG/3ML injection 1 kit   Tobacco use disorder    Casey Hurley continues to smoke tobacco daily. Discussed importance of reducing and quitting tobacco use to reduce risk of cardiovascular, respiratory and malignant disease in the future. In the precontemplation stage of change and not ready to quit.       Healthcare maintenance    Discussed importance of safe sexual practice and condom use. Condoms offered.  Prenvar20 in 1 year.  Routine dental care up to date per recommendations.  Due for colon cancer screening and will schedule.         I have discontinued Marland Kitchen E. Holeman's imiquimod, traZODone, famotidine, and fluconazole. I am also having him maintain his albuterol,  ALPRAZolam, dronabinol, tadalafil, cabotegravir & rilpivirine ER, and valACYclovir. We will continue to administer cabotegravir & rilpivirine ER.   Meds ordered this encounter  Medications   cabotegravir & rilpivirine ER (CABENUVA) 600 & 900 MG/3ML injection 1 kit     Follow-up: Return in about 2 months (around 04/02/2022), or if symptoms worsen or fail to improve.   Terri Piedra, MSN, FNP-C Nurse Practitioner [facility] for Infectious Disease [place] number: [phone]

## 2022-01-31 NOTE — Assessment & Plan Note (Signed)
Mr. Mostafa continues to smoke tobacco daily. Discussed importance of reducing and quitting tobacco use to reduce risk of cardiovascular, respiratory and malignant disease in the future. In the precontemplation stage of change and not ready to quit.

## 2022-04-04 ENCOUNTER — Other Ambulatory Visit (HOSPITAL_COMMUNITY): Payer: Self-pay

## 2022-04-04 ENCOUNTER — Telehealth: Payer: Self-pay

## 2022-04-04 ENCOUNTER — Ambulatory Visit (INDEPENDENT_AMBULATORY_CARE_PROVIDER_SITE_OTHER): Payer: Managed Care, Other (non HMO) | Admitting: Pharmacist

## 2022-04-04 ENCOUNTER — Other Ambulatory Visit: Payer: Self-pay

## 2022-04-04 DIAGNOSIS — B20 Human immunodeficiency virus [HIV] disease: Secondary | ICD-10-CM | POA: Diagnosis not present

## 2022-04-04 MED ORDER — CABOTEGRAVIR & RILPIVIRINE ER 600 & 900 MG/3ML IM SUER
1.0000 | INTRAMUSCULAR | 5 refills | Status: AC
  Filled 2022-04-04: qty 6, 60d supply, fill #0
  Filled 2022-04-05: qty 6, 56d supply, fill #0
  Filled 2022-05-29: qty 6, 56d supply, fill #1

## 2022-04-04 MED ORDER — CABOTEGRAVIR & RILPIVIRINE ER 600 & 900 MG/3ML IM SUER
1.0000 | Freq: Once | INTRAMUSCULAR | Status: AC
  Administered 2022-04-04: 1 via INTRAMUSCULAR

## 2022-04-04 NOTE — Progress Notes (Unsigned)
HPI: Casey Hurley is a 51 y.o. male who presents to the [place] clinic for [place] administration.  Patient Active Problem List   Diagnosis Date Noted   Healthcare maintenance 01/31/2022   Insomnia 01/16/2019   Anxiety 01/16/2019   Impotence due to erectile dysfunction 11/12/2018   Alcoholism ([place]) 01/31/2016   Tobacco use disorder 02/22/2015   Molluscum contagiosum infection 10/22/2014   Hyperglycemia 09/11/2014   AIDS ([place]) 08/25/2014   Tachycardia    Essential hypertension 08/24/2014   Genital herpes 08/24/2014   Recurrent pneumonia 08/23/2014   Post-operative state 12/11/2013   Anal condyloma 11/10/2013   Breast abscess in male 07/08/2012    Patient's Medications  New Prescriptions   CABOTEGRAVIR & RILPIVIRINE ER (CABENUVA) 600 & 900 MG/3ML INJECTION    Inject 1 kit into the muscle every 2 (two) months.  Previous Medications   ALBUTEROL (VENTOLIN HFA) 108 (90 BASE) MCG/ACT INHALER       ALPRAZOLAM (XANAX) 0.25 MG TABLET    Take 1 tablet (0.25 mg total) by mouth 2 (two) times daily as needed for anxiety.   DRONABINOL (MARINOL) 5 MG CAPSULE    TAKE ONE CAPSULE BY MOUTH TWICE DAILY BEFORE A MEAL   TADALAFIL (CIALIS) 20 MG TABLET       VALACYCLOVIR (VALTREX) 500 MG TABLET    Take 1 tablet by mouth daily.  Modified Medications   No medications on file  Discontinued Medications   CABOTEGRAVIR & RILPIVIRINE ER (CABENUVA) 600 & 900 MG/3ML INJECTION    Inject 1 kit into the muscle every 2 (two) months.    Allergies: Allergies  Allergen Reactions   Restoril [Temazepam] Other (See Comments)    Violent nightmares    Past Medical History: Past Medical History:  Diagnosis Date   Acquired immune deficiency syndrome ([place])    Genital herpes    HIV (human immunodeficiency virus infection) ([place])    Hypertension    PCP (pneumocystis carinii pneumonia) ([place])     Social History: Social History   Socioeconomic History   Marital status: Married    Spouse name:  Not on file   Number of children: Not on file   Years of education: Not on file   Highest education level: Not on file  Occupational History   Not on file  Tobacco Use   Smoking status: Every Day    Packs/day: 1.00    Types: Cigarettes    Start date: 06/02/2014   Smokeless tobacco: Never  Substance and Sexual Activity   Alcohol use: Yes    Alcohol/week: 6.0 standard drinks of alcohol    Types: 6 Cans of beer per week   Drug use: Yes    Types: Marijuana   Sexual activity: Yes    Partners: Female    Comment: declined condoms  Other Topics Concern   Not on file  Social History Narrative   Not on file   Social Determinants of Health   Financial Resource Strain: Not on file  Food Insecurity: Not on file  Transportation Needs: Not on file  Physical Activity: Not on file  Stress: Not on file  Social Connections: Not on file    Labs: Lab Results  Component Value Date   HIV1RNAQUANT NOT DETECTED 10/27/2021   HIV1RNAQUANT NOT DETECTED 09/29/2021   HIV1RNAQUANT Not Detected 08/02/2021   CD4TABS 228 (L) 10/27/2021   CD4TABS 193 (L) 05/03/2021   CD4TABS 245 (L) 08/13/2019    RPR and STI Lab Results  Component Value Date  LABRPR NON-REACTIVE 10/27/2021   LABRPR REACTIVE (A) 08/02/2021   LABRPR REACTIVE (A) 05/03/2021   LABRPR REACTIVE (A) 07/27/2020   LABRPR NON-REACTIVE 08/13/2019   RPRTITER 1:1 (H) 08/02/2021   RPRTITER 1:1 (H) 05/03/2021   RPRTITER 1:16 (H) 07/27/2020    STI Results GC CT  10/27/2021  3:44 PM Negative  Negative   05/03/2021  4:16 PM Negative  Negative   07/27/2020  9:23 AM Negative  Negative   08/13/2019 12:00 AM Negative  Negative   10/29/2018 12:00 AM Negative  Negative   07/30/2017 12:00 AM Negative  Negative   01/24/2017 12:00 AM Negative  Negative   08/09/2016 12:00 AM Negative  Negative   01/20/2016 12:00 AM Negative  Negative   08/23/2015 12:00 AM Negative  Negative   08/25/2014 12:00 AM NG: Negative  CT: Negative     Hepatitis B Lab  Results  Component Value Date   HEPBSAB NON-REACTIVE 02/07/2017   HEPBSAG NEGATIVE 08/25/2014   Hepatitis C No results found for: "HEPCAB", "HCVRNAPCRQN" Hepatitis A Lab Results  Component Value Date   HAV NON REACTIVE 08/25/2014   Lipids: Lab Results  Component Value Date   CHOL 168 10/27/2021   TRIG 204 (H) 10/27/2021   HDL 39 (L) 10/27/2021   CHOLHDL 4.3 10/27/2021   VLDL 32 (H) 01/24/2017   LDLCALC 99 10/27/2021    TARGET DATE: 11th of each month   Assessment: Mr. Schaible presents today for his maintenance Cabenuva injections. Past injections were tolerated well without issues.  Administered cabotegravir 622m/3mL in left upper outer quadrant of the gluteal muscle. Administered rilpivirine 900 mg/339min the right upper outer quadrant of the gluteal muscle. No issues with injections. He will follow up in 2 months for next set of injections.  Pt declined STI testing at today's visit. Wished to defer vaccinations, including flu to a future visit.   Plan: - Cabenuva injections administered - Consider starting Hepatitis B vaccination series at next visit - F/U Hep A Ab, RPR, CD4, HIV RNA  - Next injections scheduled for 06/07/22 with AmAlfonse SprucePharmD  - Call with any issues or questions  AuAdria DillPharmD PGY-2 Infectious Diseases Resident  04/04/2022 4:26 PM

## 2022-04-04 NOTE — Telephone Encounter (Addendum)
[facility] Patient Advocate Encounter  Prior Authorization for [place] has been approved.    PA# [id number] Effective dates: 04/04/22 through 04/04/23  Patients co-pay is $0.00.   Prescription can be filled with [facility].  [facility] will continue to follow.  Ileene Patrick, [place] [facility] Patient [facility] for Infectious Disease Phone: [phone] Fax:  [phone]

## 2022-04-05 ENCOUNTER — Other Ambulatory Visit (HOSPITAL_COMMUNITY): Payer: Self-pay

## 2022-04-05 LAB — T-HELPER CELLS (CD4) COUNT (NOT AT ARMC)
CD4 % Helper T Cell: 14 % — ABNORMAL LOW (ref 33–65)
CD4 T Cell Abs: 294 /uL — ABNORMAL LOW (ref 400–1790)

## 2022-04-06 ENCOUNTER — Telehealth: Payer: Self-pay

## 2022-04-06 LAB — HEPATITIS A ANTIBODY, TOTAL: Hepatitis A AB,Total: NONREACTIVE

## 2022-04-06 LAB — HIV-1 RNA QUANT-NO REFLEX-BLD
HIV 1 RNA Quant: NOT DETECTED Copies/mL
HIV-1 RNA Quant, Log: NOT DETECTED Log cps/mL

## 2022-04-06 LAB — RPR: RPR Ser Ql: NONREACTIVE

## 2022-04-06 NOTE — Telephone Encounter (Signed)
[facility] Patient Advocate Encounter  Patient's medication (Cabenuva) have been couriered to [facility] from [facility] and will be administered on the patient next office visit on 04/04/22.  Darnetta Kesselman , CPhT Specialty Pharmacy Patient [facility] Phone: [phone] Fax:  [phone]  

## 2022-05-29 ENCOUNTER — Other Ambulatory Visit (HOSPITAL_COMMUNITY): Payer: Self-pay

## 2022-05-30 ENCOUNTER — Telehealth: Payer: Self-pay

## 2022-05-30 NOTE — Telephone Encounter (Signed)
[facility] Patient Advocate Encounter  Patient's medication Casey Hurley) have been couriered to [facility] from [facility] and will be administered on the patient next office visit on 06/07/22.  Clearance Coots , CPhT Specialty Pharmacy Patient [facility] for Infectious Disease Phone: [phone] Fax:  [phone]

## 2022-06-01 ENCOUNTER — Other Ambulatory Visit (HOSPITAL_COMMUNITY): Payer: Self-pay

## 2022-06-02 ENCOUNTER — Other Ambulatory Visit (HOSPITAL_COMMUNITY): Payer: Self-pay

## 2022-06-07 ENCOUNTER — Other Ambulatory Visit: Payer: Self-pay

## 2022-06-07 ENCOUNTER — Ambulatory Visit: Payer: Managed Care, Other (non HMO) | Admitting: Pharmacist

## 2022-06-07 DIAGNOSIS — B20 Human immunodeficiency virus [HIV] disease: Secondary | ICD-10-CM | POA: Diagnosis not present

## 2022-06-07 MED ORDER — CABOTEGRAVIR & RILPIVIRINE ER 600 & 900 MG/3ML IM SUER
1.0000 | Freq: Once | INTRAMUSCULAR | Status: AC
  Administered 2022-06-07: 1 via INTRAMUSCULAR

## 2022-06-07 MED ORDER — VALACYCLOVIR HCL 500 MG PO TABS
500.0000 mg | ORAL_TABLET | Freq: Every day | ORAL | 1 refills | Status: DC
  Filled 2022-06-07: qty 34, 34d supply, fill #0
  Filled 2022-10-09: qty 90, 90d supply, fill #1
  Filled 2023-03-07 – 2023-03-15 (×2): qty 56, 56d supply, fill #2

## 2022-06-07 NOTE — Progress Notes (Signed)
HPI: Casey Hurley is a 51 y.o. male who presents to the [place] clinic for [place] administration.  Patient Active Problem List   Diagnosis Date Noted   Healthcare maintenance 01/31/2022   Insomnia 01/16/2019   Anxiety 01/16/2019   Impotence due to erectile dysfunction 11/12/2018   Alcoholism ([place]) 01/31/2016   Tobacco use disorder 02/22/2015   Molluscum contagiosum infection 10/22/2014   Hyperglycemia 09/11/2014   AIDS ([place]) 08/25/2014   Tachycardia    Essential hypertension 08/24/2014   Genital herpes 08/24/2014   Recurrent pneumonia 08/23/2014   Post-operative state 12/11/2013   Anal condyloma 11/10/2013   Breast abscess in male 07/08/2012    Patient's Medications  New Prescriptions   No medications on file  Previous Medications   ALBUTEROL (VENTOLIN HFA) 108 (90 BASE) MCG/ACT INHALER       ALPRAZOLAM (XANAX) 0.25 MG TABLET    Take 1 tablet (0.25 mg total) by mouth 2 (two) times daily as needed for anxiety.   CABOTEGRAVIR & RILPIVIRINE ER (CABENUVA) 600 & 900 MG/3ML INJECTION    Inject 1 kit into the muscle every 2 (two) months.   DRONABINOL (MARINOL) 5 MG CAPSULE    TAKE ONE CAPSULE BY MOUTH TWICE DAILY BEFORE A MEAL   TADALAFIL (CIALIS) 20 MG TABLET      Modified Medications   Modified Medication Previous Medication   VALACYCLOVIR (VALTREX) 500 MG TABLET valACYclovir (VALTREX) 500 MG tablet      Take 1 tablet (500 mg total) by mouth daily.    Take 1 tablet by mouth daily.  Discontinued Medications   No medications on file    Allergies: Allergies  Allergen Reactions   Restoril [Temazepam] Other (See Comments)    Violent nightmares    Past Medical History: Past Medical History:  Diagnosis Date   Acquired immune deficiency syndrome ([place])    Genital herpes    HIV (human immunodeficiency virus infection) ([place])    Hypertension    PCP (pneumocystis carinii pneumonia) ([place])     Social History: Social History   Socioeconomic History   Marital  status: Married    Spouse name: Not on file   Number of children: Not on file   Years of education: Not on file   Highest education level: Not on file  Occupational History   Not on file  Tobacco Use   Smoking status: Every Day    Packs/day: 1.00    Types: Cigarettes    Start date: 06/02/2014   Smokeless tobacco: Never  Substance and Sexual Activity   Alcohol use: Yes    Alcohol/week: 6.0 standard drinks of alcohol    Types: 6 Cans of beer per week   Drug use: Yes    Types: Marijuana   Sexual activity: Yes    Partners: Female    Comment: declined condoms  Other Topics Concern   Not on file  Social History Narrative   Not on file   Social Determinants of Health   Financial Resource Strain: Not on file  Food Insecurity: Not on file  Transportation Needs: Not on file  Physical Activity: Not on file  Stress: Not on file  Social Connections: Not on file    Labs: Lab Results  Component Value Date   HIV1RNAQUANT Not Detected 04/04/2022   HIV1RNAQUANT NOT DETECTED 10/27/2021   HIV1RNAQUANT NOT DETECTED 09/29/2021   CD4TABS 294 (L) 04/04/2022   CD4TABS 228 (L) 10/27/2021   CD4TABS 193 (L) 05/03/2021    RPR and STI  Lab Results  Component Value Date   LABRPR NON-REACTIVE 04/04/2022   LABRPR NON-REACTIVE 10/27/2021   LABRPR REACTIVE (A) 08/02/2021   LABRPR REACTIVE (A) 05/03/2021   LABRPR REACTIVE (A) 07/27/2020   RPRTITER 1:1 (H) 08/02/2021   RPRTITER 1:1 (H) 05/03/2021   RPRTITER 1:16 (H) 07/27/2020    STI Results GC CT  10/27/2021  3:44 PM Negative  Negative   05/03/2021  4:16 PM Negative  Negative   07/27/2020  9:23 AM Negative  Negative   08/13/2019 12:00 AM Negative  Negative   10/29/2018 12:00 AM Negative  Negative   07/30/2017 12:00 AM Negative  Negative   01/24/2017 12:00 AM Negative  Negative   08/09/2016 12:00 AM Negative  Negative   01/20/2016 12:00 AM Negative  Negative   08/23/2015 12:00 AM Negative  Negative   08/25/2014 12:00 AM NG: Negative  CT:  Negative     Hepatitis B Lab Results  Component Value Date   HEPBSAB NON-REACTIVE 02/07/2017   HEPBSAG NEGATIVE 08/25/2014   Hepatitis C No results found for: "HEPCAB", "HCVRNAPCRQN" Hepatitis A Lab Results  Component Value Date   HAV NON-REACTIVE 04/04/2022   Lipids: Lab Results  Component Value Date   CHOL 168 10/27/2021   TRIG 204 (H) 10/27/2021   HDL 39 (L) 10/27/2021   CHOLHDL 4.3 10/27/2021   VLDL 32 (H) 01/24/2017   LDLCALC 99 10/27/2021    TARGET DATE:  The 11th of the month  Current HIV Regimen: Cabenuva  Assessment: Casey Hurley presents today for their maintenance Cabenuva injections. Initial/past injections were tolerated well without issues. No problems with systemic effects of injections. Administered cabotegravir 623m/3mL in left upper outer quadrant of the gluteal muscle. Administered rilpivirine 900 mg/363min the right upper outer quadrant of the gluteal muscle. Monitored patient for 10 minutes after injection. Injections were tolerated well without issue. Patient will follow up in 2 months for next injection.  Patient politely declined all vaccines today; he is eligible for HAV, HBV, flu, and COVID.   Patient let me know that he will be starting on new insurance next January and then will likely change in March due to his wife's employer. Informed him to please send his new insurance card to [facility] he receives; let DoButch Pennynow what to expect as well.  Patient [facility]  Plan: - Cabenuva injections administered - Next injections scheduled for 2/6 with me, 4/15 with GrMarya Amslerand 6/4 with me  - [facility]rex [facility] with any issues or questions  AmAlfonse SprucePharmD, CPP, BCIDP, AAHIVP Clinical Pharmacist Practitioner Infectious Diseases [facility]

## 2022-07-21 ENCOUNTER — Telehealth: Payer: Self-pay

## 2022-07-21 ENCOUNTER — Other Ambulatory Visit (HOSPITAL_COMMUNITY): Payer: Self-pay

## 2022-07-21 NOTE — Telephone Encounter (Signed)
[facility] Patient Advocate Encounter  I called [employer] @ [phone] for [phone] & [phone] no pre-cert is required. ([employer]) Ref # [id number]  Patient is enrolled in [place], [place] Patient [facility] for Infectious Disease Phone: [phone] Fax:  [phone]

## 2022-07-21 NOTE — Telephone Encounter (Signed)
Thanks Butch Penny - he is Cabenuva, not [employer]!

## 2022-08-01 ENCOUNTER — Other Ambulatory Visit (HOSPITAL_COMMUNITY): Payer: Self-pay

## 2022-08-01 ENCOUNTER — Ambulatory Visit: Payer: BC Managed Care – PPO | Admitting: Pharmacist

## 2022-08-01 ENCOUNTER — Other Ambulatory Visit: Payer: Self-pay

## 2022-08-01 DIAGNOSIS — B20 Human immunodeficiency virus [HIV] disease: Secondary | ICD-10-CM

## 2022-08-01 DIAGNOSIS — Z0000 Encounter for general adult medical examination without abnormal findings: Secondary | ICD-10-CM

## 2022-08-01 MED ORDER — CABOTEGRAVIR & RILPIVIRINE ER 600 & 900 MG/3ML IM SUER
1.0000 | Freq: Once | INTRAMUSCULAR | Status: AC
  Administered 2022-08-01: 1 via INTRAMUSCULAR

## 2022-08-01 NOTE — Progress Notes (Signed)
HPI: Casey Hurley is a 52 y.o. male who presents to the [place] clinic for [place] administration.  Patient Active Problem List   Diagnosis Date Noted   Healthcare maintenance 01/31/2022   Insomnia 01/16/2019   Anxiety 01/16/2019   Impotence due to erectile dysfunction 11/12/2018   Alcoholism ([place]) 01/31/2016   Tobacco use disorder 02/22/2015   Molluscum contagiosum infection 10/22/2014   Hyperglycemia 09/11/2014   AIDS ([place]) 08/25/2014   Tachycardia    Essential hypertension 08/24/2014   Genital herpes 08/24/2014   Recurrent pneumonia 08/23/2014   Post-operative state 12/11/2013   Anal condyloma 11/10/2013   Breast abscess in male 07/08/2012    Patient's Medications  New Prescriptions   No medications on file  Previous Medications   ALBUTEROL (VENTOLIN HFA) 108 (90 BASE) MCG/ACT INHALER       ALPRAZOLAM (XANAX) 0.25 MG TABLET    Take 1 tablet (0.25 mg total) by mouth 2 (two) times daily as needed for anxiety.   CABOTEGRAVIR & RILPIVIRINE ER (CABENUVA) 600 & 900 MG/3ML INJECTION    Inject 1 kit into the muscle every 2 (two) months.   DRONABINOL (MARINOL) 5 MG CAPSULE    TAKE ONE CAPSULE BY MOUTH TWICE DAILY BEFORE A MEAL   TADALAFIL (CIALIS) 20 MG TABLET       VALACYCLOVIR (VALTREX) 500 MG TABLET    Take 1 tablet (500 mg total) by mouth daily.  Modified Medications   No medications on file  Discontinued Medications   No medications on file    Allergies: Allergies  Allergen Reactions   Restoril [Temazepam] Other (See Comments)    Violent nightmares    Past Medical History: Past Medical History:  Diagnosis Date   Acquired immune deficiency syndrome ([place])    Genital herpes    HIV (human immunodeficiency virus infection) ([place])    Hypertension    PCP (pneumocystis carinii pneumonia) ([place])     Social History: Social History   Socioeconomic History   Marital status: Married    Spouse name: Not on file   Number of children: Not on file   Years of  education: Not on file   Highest education level: Not on file  Occupational History   Not on file  Tobacco Use   Smoking status: Every Day    Packs/day: 1.00    Types: Cigarettes    Start date: 06/02/2014   Smokeless tobacco: Never  Substance and Sexual Activity   Alcohol use: Yes    Alcohol/week: 6.0 standard drinks of alcohol    Types: 6 Cans of beer per week   Drug use: Yes    Types: Marijuana   Sexual activity: Yes    Partners: Female    Comment: declined condoms  Other Topics Concern   Not on file  Social History Narrative   Not on file   Social Determinants of Health   Financial Resource Strain: Not on file  Food Insecurity: Not on file  Transportation Needs: Not on file  Physical Activity: Not on file  Stress: Not on file  Social Connections: Not on file    Labs: Lab Results  Component Value Date   HIV1RNAQUANT Not Detected 04/04/2022   HIV1RNAQUANT NOT DETECTED 10/27/2021   HIV1RNAQUANT NOT DETECTED 09/29/2021   CD4TABS 294 (L) 04/04/2022   CD4TABS 228 (L) 10/27/2021   CD4TABS 193 (L) 05/03/2021    RPR and STI Lab Results  Component Value Date   LABRPR NON-REACTIVE 04/04/2022   LABRPR NON-REACTIVE 10/27/2021  LABRPR REACTIVE (A) 08/02/2021   LABRPR REACTIVE (A) 05/03/2021   LABRPR REACTIVE (A) 07/27/2020   RPRTITER 1:1 (H) 08/02/2021   RPRTITER 1:1 (H) 05/03/2021   RPRTITER 1:16 (H) 07/27/2020    STI Results GC CT  10/27/2021  3:44 PM Negative  Negative   05/03/2021  4:16 PM Negative  Negative   07/27/2020  9:23 AM Negative  Negative   08/13/2019 12:00 AM Negative  Negative   10/29/2018 12:00 AM Negative  Negative   07/30/2017 12:00 AM Negative  Negative   01/24/2017 12:00 AM Negative  Negative   08/09/2016 12:00 AM Negative  Negative   01/20/2016 12:00 AM Negative  Negative   08/23/2015 12:00 AM Negative  Negative   08/25/2014 12:00 AM NG: Negative  CT: Negative     Hepatitis B Lab Results  Component Value Date   HEPBSAB NON-REACTIVE  02/07/2017   HEPBSAG NEGATIVE 08/25/2014   Hepatitis C No results found for: "HEPCAB", "HCVRNAPCRQN" Hepatitis A Lab Results  Component Value Date   HAV NON-REACTIVE 04/04/2022   Lipids: Lab Results  Component Value Date   CHOL 168 10/27/2021   TRIG 204 (H) 10/27/2021   HDL 39 (L) 10/27/2021   CHOLHDL 4.3 10/27/2021   VLDL 32 (H) 01/24/2017   LDLCALC 99 10/27/2021    TARGET DATE: 11th  Assessment: Casey Hurley presents today for maintenance Cabenuva injections. Past injections were tolerated well without issues.   Administered cabotegravir 600mg /63mL in left upper outer quadrant of the gluteal muscle. Administered rilpivirine 900 mg/62mL in the right upper outer quadrant of the gluteal muscle. No issues with injections. He will follow up in 2 months for next set of injections.  Pt declined STI testing at todays visit. Not interested in vaccinations for HAV/HBV, flu and COVID at this time.   Plan: - Cabenuva injections administered - F/u HIV RNA, CD4, RPR, Hep A ab at next visit - Next injections scheduled for 10/08/21 with Dr. Elna Breslow - Call with any issues or questions  Titus Dubin, PharmD PGY1 Pharmacy Resident 08/01/2022 4:22 PM

## 2022-08-08 ENCOUNTER — Other Ambulatory Visit (HOSPITAL_COMMUNITY): Payer: Self-pay

## 2022-09-03 DIAGNOSIS — J0110 Acute frontal sinusitis, unspecified: Secondary | ICD-10-CM | POA: Diagnosis not present

## 2022-09-03 DIAGNOSIS — J209 Acute bronchitis, unspecified: Secondary | ICD-10-CM | POA: Diagnosis not present

## 2022-09-03 DIAGNOSIS — R070 Pain in throat: Secondary | ICD-10-CM | POA: Diagnosis not present

## 2022-09-03 DIAGNOSIS — R519 Headache, unspecified: Secondary | ICD-10-CM | POA: Diagnosis not present

## 2022-10-09 ENCOUNTER — Ambulatory Visit (INDEPENDENT_AMBULATORY_CARE_PROVIDER_SITE_OTHER): Payer: BC Managed Care – PPO | Admitting: Family

## 2022-10-09 ENCOUNTER — Other Ambulatory Visit: Payer: Self-pay

## 2022-10-09 ENCOUNTER — Encounter: Payer: Self-pay | Admitting: Family

## 2022-10-09 VITALS — BP 133/89 | HR 106 | Temp 98.8°F | Ht 70.0 in | Wt 136.0 lb

## 2022-10-09 DIAGNOSIS — Z113 Encounter for screening for infections with a predominantly sexual mode of transmission: Secondary | ICD-10-CM | POA: Diagnosis not present

## 2022-10-09 DIAGNOSIS — B20 Human immunodeficiency virus [HIV] disease: Secondary | ICD-10-CM | POA: Diagnosis not present

## 2022-10-09 DIAGNOSIS — Z79899 Other long term (current) drug therapy: Secondary | ICD-10-CM

## 2022-10-09 DIAGNOSIS — F17210 Nicotine dependence, cigarettes, uncomplicated: Secondary | ICD-10-CM | POA: Diagnosis not present

## 2022-10-09 DIAGNOSIS — F17200 Nicotine dependence, unspecified, uncomplicated: Secondary | ICD-10-CM

## 2022-10-09 DIAGNOSIS — Z0000 Encounter for general adult medical examination without abnormal findings: Secondary | ICD-10-CM

## 2022-10-09 MED ORDER — CABOTEGRAVIR & RILPIVIRINE ER 600 & 900 MG/3ML IM SUER
1.0000 | Freq: Once | INTRAMUSCULAR | Status: AC
  Administered 2022-10-09: 1 via INTRAMUSCULAR

## 2022-10-09 NOTE — Progress Notes (Signed)
Brief Narrative   Patient ID: Casey Hurley, male    DOB: [birth year], 52 y.o.   MRN: [id number]  Casey Hurley is a 52 y/o male with HIV disease diagnosed in February 2016 with risk factor of heterosexual contact. Initial viral load was 80,520 with CD4 count 20. History of PCP. No genotype available for review. [id number] negative. Entered care at [facility] Stage 3. ART experienced with Genvoya and now Guinea.   Subjective:    Chief Complaint  Patient presents with   Follow-up   HIV Positive/AIDS    HPI:  Casey Hurley is a 52 y.o. male with HIV/AIDS last seen on 08/01/22 by Margarite Gouge, PharmD, CPP with well controlled virus and good adherence and tolerance to Cabenuva. Last lab work in October 2023 with undetectable viral load and CD4 count 294. Received Cabenuva with no adverse side effects and here today for follow up.  Casey Hurley has been doing well since his last office visit. Tolerating Cabenuva without problems. Does have an EOB indicating denial of coverage for his last Cabenuva injection. No new concerns/complaints. Condoms and STD testing offered. Routine dental care up to date. Declines vaccines. Colonoscopy scheduled.   Denies fevers, chills, night sweats, headaches, changes in vision, neck pain/stiffness, nausea, diarrhea, vomiting, lesions or rashes.  Allergies  Allergen Reactions   Restoril [Temazepam] Other (See Comments)    Violent nightmares      Outpatient Medications Prior to Visit  Medication Sig Dispense Refill   albuterol (VENTOLIN HFA) 108 (90 Base) MCG/ACT inhaler      cabotegravir & rilpivirine ER (CABENUVA) 600 & 900 MG/3ML injection Inject 1 kit into the muscle every 2 (two) months. 6 mL 5   dronabinol (MARINOL) 5 MG capsule TAKE ONE CAPSULE BY MOUTH TWICE DAILY BEFORE A MEAL 180 capsule 0   tadalafil (CIALIS) 20 MG tablet      valACYclovir (VALTREX) 500 MG tablet Take 1 tablet (500 mg total) by mouth daily. 90 tablet 1   ALPRAZolam (XANAX) 0.25 MG  tablet Take 1 tablet (0.25 mg total) by mouth 2 (two) times daily as needed for anxiety. (Patient not taking: Reported on 10/09/2022) 10 tablet 0   No facility-administered medications prior to visit.     Past Medical History:  Diagnosis Date   Acquired immune deficiency syndrome    Genital herpes    HIV (human immunodeficiency virus infection)    Hypertension    PCP (pneumocystis carinii pneumonia)      Past Surgical History:  Procedure Laterality Date   ANAL EXAMINATION UNDER ANESTHESIA     anal condylomas   VIDEO BRONCHOSCOPY Bilateral 08/25/2014   Procedure: VIDEO BRONCHOSCOPY WITHOUT FLUORO;  Surgeon: Leslye Peer, MD;  Location: [facility];  Service: Cardiopulmonary;  Laterality: Bilateral;      Review of Systems  Constitutional:  Negative for appetite change, chills, fatigue, fever and unexpected weight change.  Eyes:  Negative for visual disturbance.  Respiratory:  Negative for cough, chest tightness, shortness of breath and wheezing.   Cardiovascular:  Negative for chest pain and leg swelling.  Gastrointestinal:  Negative for abdominal pain, constipation, diarrhea, nausea and vomiting.  Genitourinary:  Negative for dysuria, flank pain, frequency, genital sores, hematuria and urgency.  Skin:  Negative for rash.  Allergic/Immunologic: Negative for immunocompromised state.  Neurological:  Negative for dizziness and headaches.      Objective:    BP 133/89   Pulse (!) 106   Temp 98.8 F (37.1 C) (Oral)  Ht  (1.778 m)   Wt 136 lb (61.7 kg)   SpO2 99%   BMI 19.51 kg/m  Nursing note and vital signs reviewed.  Physical Exam Constitutional:      General: He is not in acute distress.    Appearance: He is well-developed.  Eyes:     Conjunctiva/sclera: Conjunctivae normal.  Cardiovascular:     Rate and Rhythm: Normal rate and regular rhythm.     Heart sounds: Normal heart sounds. No murmur heard.    No friction rub. No gallop.  Pulmonary:     Effort:  Pulmonary effort is normal. No respiratory distress.     Breath sounds: Normal breath sounds. No wheezing or rales.  Chest:     Chest wall: No tenderness.  Abdominal:     General: Bowel sounds are normal.     Palpations: Abdomen is soft.     Tenderness: There is no abdominal tenderness.  Musculoskeletal:     Cervical back: Neck supple.  Lymphadenopathy:     Cervical: No cervical adenopathy.  Skin:    General: Skin is warm and dry.     Findings: No rash.  Neurological:     Mental Status: He is alert and oriented to person, place, and time.  Psychiatric:        Behavior: Behavior normal.        Thought Content: Thought content normal.        Judgment: Judgment normal.         10/09/2022    4:02 PM 01/31/2022    3:57 PM 05/24/2021    4:03 PM 07/27/2020    8:56 AM 08/26/2019    3:55 PM  Depression screen PHQ 2/9  Decreased Interest 0 0 0 0 0  Down, Depressed, Hopeless 0 0 0 0 1  PHQ - 2 Score 0 0 0 0 1       Assessment & Plan:    Patient Active Problem List   Diagnosis Date Noted   Healthcare maintenance 01/31/2022   Insomnia 01/16/2019   Anxiety 01/16/2019   Impotence due to erectile dysfunction 11/12/2018   Alcoholism 01/31/2016   Tobacco use disorder 02/22/2015   Molluscum contagiosum infection 10/22/2014   Hyperglycemia 09/11/2014   AIDS 08/25/2014   Tachycardia    Essential hypertension 08/24/2014   Genital herpes 08/24/2014   Recurrent pneumonia 08/23/2014   Post-operative state 12/11/2013   Anal condyloma 11/10/2013   Breast abscess in male 07/08/2012     Problem List Items Addressed This Visit       Other   AIDS - Primary    Casey Hurley continues to have well controlled virus with good adherence and tolerance to Guinea. Reviewed previous lab work and discussed plan of care. Check lab work. Appears he met criteria set forth by insurance for coverage of Cabenuva and will defer to pharmacy/fianancial team(s). Cabenuva injection provided with no adverse  side effects. Plan for follow up with pharmacy team in 2 months and NP/MD in 6 months or sooner if needed.       Relevant Orders   COMPLETE METABOLIC PANEL WITH GFR   T-helper cell (CD4)- ([facility] clinic only)   HIV-1 RNA quant-no reflex-bld   Tobacco use disorder    Casey Hurley continues to smoke tobacco daily. Discussed continued risk of tobacco use including cardiovascular, respiratory and malignant disease. In the pre-contemplation stage and not ready to quit.       Healthcare maintenance    Discussed importance of  safe sexual practice and condom use. Condoms and STD testing offered.  Declines Menveo Colonoscopy scheduled Dental care up to date.       Other Visit Diagnoses     Screening for STDs (sexually transmitted diseases)       Relevant Orders   RPR   Pharmacologic therapy       Relevant Orders   Lipid panel        I am having Helayne Seminole maintain his albuterol, ALPRAZolam, dronabinol, tadalafil, cabotegravir & rilpivirine ER, and valACYclovir. We administered cabotegravir & rilpivirine ER.   Meds ordered this encounter  Medications   cabotegravir & rilpivirine ER (CABENUVA) 600 & 900 MG/3ML injection 1 kit     Follow-up: Return in about 6 months (around 04/10/2023), or if symptoms worsen or fail to improve.   Marcos Eke, MSN, FNP-C Nurse Practitioner [facility] Main number: [phone]

## 2022-10-09 NOTE — Assessment & Plan Note (Signed)
Casey Hurley continues to have well controlled virus with good adherence and tolerance to Guinea. Reviewed previous lab work and discussed plan of care. Check lab work. Appears he met criteria set forth by insurance for coverage of Cabenuva and will defer to pharmacy/fianancial team(s). Cabenuva injection provided with no adverse side effects. Plan for follow up with pharmacy team in 2 months and NP/MD in 6 months or sooner if needed.

## 2022-10-09 NOTE — Patient Instructions (Addendum)
Nice to see you.  We will check your lab work today.  Plan for follow up in 2 months or sooner if needed with pharmacy team and with NP/MD in 6 months.   Have a great day and stay safe!

## 2022-10-09 NOTE — Assessment & Plan Note (Signed)
Discussed importance of safe sexual practice and condom use. Condoms and STD testing offered.  Declines Menveo Colonoscopy scheduled Dental care up to date.

## 2022-10-09 NOTE — Assessment & Plan Note (Signed)
Casey Hurley continues to smoke tobacco daily. Discussed continued risk of tobacco use including cardiovascular, respiratory and malignant disease. In the pre-contemplation stage and not ready to quit.

## 2022-10-10 ENCOUNTER — Other Ambulatory Visit (HOSPITAL_COMMUNITY): Payer: Self-pay

## 2022-10-10 LAB — T-HELPER CELL (CD4) - (RCID CLINIC ONLY)
CD4 % Helper T Cell: 14 % — ABNORMAL LOW (ref 33–65)
CD4 T Cell Abs: 257 /uL — ABNORMAL LOW (ref 400–1790)

## 2022-10-10 NOTE — Telephone Encounter (Signed)
Thank You.

## 2022-10-12 LAB — LIPID PANEL
Cholesterol: 220 mg/dL — ABNORMAL HIGH
HDL: 54 mg/dL
LDL Cholesterol (Calc): 136 mg/dL — ABNORMAL HIGH
Non-HDL Cholesterol (Calc): 166 mg/dL — ABNORMAL HIGH
Total CHOL/HDL Ratio: 4.1 (calc)
Triglycerides: 166 mg/dL — ABNORMAL HIGH

## 2022-10-12 LAB — COMPLETE METABOLIC PANEL WITHOUT GFR
AG Ratio: 1.7 (calc) (ref 1.0–2.5)
ALT: 18 U/L (ref 9–46)
AST: 20 U/L (ref 10–40)
Albumin: 4.8 g/dL (ref 3.6–5.1)
Alkaline phosphatase (APISO): 90 U/L (ref 36–130)
BUN: 14 mg/dL (ref 7–25)
CO2: 25 mmol/L (ref 20–32)
Calcium: 9.8 mg/dL (ref 8.6–10.3)
Chloride: 105 mmol/L (ref 98–110)
Creat: 0.96 mg/dL (ref 0.60–1.29)
Globulin: 2.8 g/dL (ref 1.9–3.7)
Glucose, Bld: 87 mg/dL (ref 65–99)
Potassium: 4.3 mmol/L (ref 3.5–5.3)
Sodium: 139 mmol/L (ref 135–146)
Total Bilirubin: 0.9 mg/dL (ref 0.2–1.2)
Total Protein: 7.6 g/dL (ref 6.1–8.1)
eGFR: 97 mL/min/{1.73_m2}

## 2022-10-12 LAB — HIV-1 RNA QUANT-NO REFLEX-BLD
HIV 1 RNA Quant: NOT DETECTED {copies}/mL
HIV-1 RNA Quant, Log: NOT DETECTED {Log_copies}/mL

## 2022-10-12 LAB — RPR: RPR Ser Ql: NONREACTIVE

## 2022-10-17 ENCOUNTER — Telehealth: Payer: Self-pay

## 2022-10-17 NOTE — Telephone Encounter (Signed)
Pharmacy Patient Advocate Encounter- Casey Hurley BIV-Medical Benefit:  J code: [id number]  CPT code: 60454  Dx Code: B20  PA was submitted to 10/17/22 and has been approved through: 10/17/23 Authorization# [id number].   [employer] BC[employer]  Notification will be in Media.

## 2022-11-03 DIAGNOSIS — Z1211 Encounter for screening for malignant neoplasm of colon: Secondary | ICD-10-CM | POA: Diagnosis not present

## 2022-11-03 DIAGNOSIS — Q438 Other specified congenital malformations of intestine: Secondary | ICD-10-CM | POA: Diagnosis not present

## 2022-11-28 ENCOUNTER — Ambulatory Visit (INDEPENDENT_AMBULATORY_CARE_PROVIDER_SITE_OTHER): Payer: BC Managed Care – PPO | Admitting: Pharmacist

## 2022-11-28 ENCOUNTER — Other Ambulatory Visit: Payer: Self-pay

## 2022-11-28 DIAGNOSIS — B20 Human immunodeficiency virus [HIV] disease: Secondary | ICD-10-CM | POA: Diagnosis not present

## 2022-11-28 MED ORDER — CABOTEGRAVIR & RILPIVIRINE ER 600 & 900 MG/3ML IM SUER
1.0000 | Freq: Once | INTRAMUSCULAR | Status: AC
  Administered 2022-11-28: 1 via INTRAMUSCULAR

## 2022-11-28 NOTE — Progress Notes (Signed)
HPI: Casey Hurley is a 52 y.o. male who presents to the [facility] pharmacy clinic for [place] administration.  Patient Active Problem List   Diagnosis Date Noted   Healthcare maintenance 01/31/2022   Insomnia 01/16/2019   Anxiety 01/16/2019   Impotence due to erectile dysfunction 11/12/2018   Alcoholism (HCC) 01/31/2016   Tobacco use disorder 02/22/2015   Molluscum contagiosum infection 10/22/2014   Hyperglycemia 09/11/2014   AIDS (HCC) 08/25/2014   Tachycardia    Essential hypertension 08/24/2014   Genital herpes 08/24/2014   Recurrent pneumonia 08/23/2014   Post-operative state 12/11/2013   Anal condyloma 11/10/2013   Breast abscess in male 07/08/2012    Patient's Medications  New Prescriptions   No medications on file  Previous Medications   ALBUTEROL (VENTOLIN HFA) 108 (90 BASE) MCG/ACT INHALER       ALPRAZOLAM (XANAX) 0.25 MG TABLET    Take 1 tablet (0.25 mg total) by mouth 2 (two) times daily as needed for anxiety.   CABOTEGRAVIR & RILPIVIRINE ER (CABENUVA) 600 & 900 MG/3ML INJECTION    Inject 1 kit into the muscle every 2 (two) months.   DRONABINOL (MARINOL) 5 MG CAPSULE    TAKE ONE CAPSULE BY MOUTH TWICE DAILY BEFORE A MEAL   TADALAFIL (CIALIS) 20 MG TABLET       VALACYCLOVIR (VALTREX) 500 MG TABLET    Take 1 tablet (500 mg total) by mouth daily.  Modified Medications   No medications on file  Discontinued Medications   No medications on file    Allergies: Allergies  Allergen Reactions   Restoril [Temazepam] Other (See Comments)    Violent nightmares    Past Medical History: Past Medical History:  Diagnosis Date   Acquired immune deficiency syndrome (HCC)    Genital herpes    HIV (human immunodeficiency virus infection) (HCC)    Hypertension    PCP (pneumocystis carinii pneumonia) (HCC)     Social History: Social History   Socioeconomic History   Marital status: Married    Spouse name: Not on file   Number of children: Not on file   Years of  education: Not on file   Highest education level: Not on file  Occupational History   Not on file  Tobacco Use   Smoking status: Every Day    Packs/day: 1    Types: Cigarettes    Start date: 06/02/2014   Smokeless tobacco: Never  Substance and Sexual Activity   Alcohol use: Yes    Alcohol/week: 6.0 standard drinks of alcohol    Types: 6 Cans of beer per week   Drug use: Yes    Types: Marijuana   Sexual activity: Yes    Partners: Female    Comment: declined condoms  Other Topics Concern   Not on file  Social History Narrative   Not on file   Social Determinants of Health   Financial Resource Strain: Not on file  Food Insecurity: Not on file  Transportation Needs: Not on file  Physical Activity: Not on file  Stress: Not on file  Social Connections: Not on file    Labs: Lab Results  Component Value Date   HIV1RNAQUANT Not Detected 10/09/2022   HIV1RNAQUANT Not Detected 04/04/2022   HIV1RNAQUANT NOT DETECTED 10/27/2021   CD4TABS 257 (L) 10/09/2022   CD4TABS 294 (L) 04/04/2022   CD4TABS 228 (L) 10/27/2021    RPR and STI Lab Results  Component Value Date   LABRPR NON-REACTIVE 10/09/2022   LABRPR NON-REACTIVE 04/04/2022  LABRPR NON-REACTIVE 10/27/2021   LABRPR REACTIVE (A) 08/02/2021   LABRPR REACTIVE (A) 05/03/2021   RPRTITER 1:1 (H) 08/02/2021   RPRTITER 1:1 (H) 05/03/2021   RPRTITER 1:16 (H) 07/27/2020    STI Results GC CT  10/27/2021  3:44 PM Negative  Negative   05/03/2021  4:16 PM Negative  Negative   07/27/2020  9:23 AM Negative  Negative   08/13/2019 12:00 AM Negative  Negative   10/29/2018 12:00 AM Negative  Negative   07/30/2017 12:00 AM Negative  Negative   01/24/2017 12:00 AM Negative  Negative   08/09/2016 12:00 AM Negative  Negative   01/20/2016 12:00 AM Negative  Negative   08/23/2015 12:00 AM Negative  Negative   08/25/2014 12:00 AM NG: Negative  CT: Negative     Hepatitis B Lab Results  Component Value Date   HEPBSAB NON-REACTIVE  02/07/2017   HEPBSAG NEGATIVE 08/25/2014   Hepatitis C No results found for: "HEPCAB", "HCVRNAPCRQN" Hepatitis A Lab Results  Component Value Date   HAV NON-REACTIVE 04/04/2022   Lipids: Lab Results  Component Value Date   CHOL 220 (H) 10/09/2022   TRIG 166 (H) 10/09/2022   HDL 54 10/09/2022   CHOLHDL 4.1 10/09/2022   VLDL 32 (H) 01/24/2017   LDLCALC 136 (H) 10/09/2022    TARGET DATE:  The 11th of the month  Current HIV Regimen: Cabenuva  Assessment: Williiam presents today for their maintenance Cabenuva injections. Initial/past injections were tolerated well without issues. No problems with systemic effects of injections.   Administered cabotegravir 600mg /78mL in left upper outer quadrant of the gluteal muscle. Administered rilpivirine 900 mg/56mL in the right upper outer quadrant of the gluteal muscle. Monitored patient for 10 minutes after injection. Injections were tolerated well without issue. Patient will follow up in 2 months for next injection. Will defer HIV RNA today as was recently checked in April. Patient politely declines STI testing and refuses HAV, HBV, and COVID vaccines today.   Plan: - Cabenuva injections administered - Next injections scheduled for 8/13 with Cassie and 10/7 with Tammy Sours  - Call with any issues or questions  Margarite Gouge, PharmD, CPP, BCIDP, AAHIVP Clinical Pharmacist Practitioner Infectious Diseases Clinical Pharmacist [facility]

## 2022-12-01 DIAGNOSIS — J101 Influenza due to other identified influenza virus with other respiratory manifestations: Secondary | ICD-10-CM | POA: Diagnosis not present

## 2022-12-01 DIAGNOSIS — R0789 Other chest pain: Secondary | ICD-10-CM | POA: Diagnosis not present

## 2023-01-30 NOTE — Progress Notes (Signed)
The 10-year ASCVD risk score (Arnett DK, et al., 2019) is: 7.3%   Values used to calculate the score:     Age: 52 years     Sex: Male     Is Non-Hispanic African American: No     Diabetic: No     Tobacco smoker: Yes     Systolic Blood Pressure: 125 mmHg     Is BP treated: No     HDL Cholesterol: 54 mg/dL     Total Cholesterol: 220 mg/dL  Sandie Ano, RN

## 2023-02-06 ENCOUNTER — Ambulatory Visit (INDEPENDENT_AMBULATORY_CARE_PROVIDER_SITE_OTHER): Payer: BC Managed Care – PPO | Admitting: Pharmacist

## 2023-02-06 ENCOUNTER — Other Ambulatory Visit: Payer: Self-pay

## 2023-02-06 DIAGNOSIS — B20 Human immunodeficiency virus [HIV] disease: Secondary | ICD-10-CM

## 2023-02-06 MED ORDER — CABOTEGRAVIR & RILPIVIRINE ER 600 & 900 MG/3ML IM SUER
1.0000 | Freq: Once | INTRAMUSCULAR | Status: AC
  Administered 2023-02-06: 1 via INTRAMUSCULAR

## 2023-02-06 NOTE — Progress Notes (Signed)
HPI: Casey Hurley is a 52 y.o. male who presents to the [facility] pharmacy clinic for [place] administration.  Patient Active Problem List   Diagnosis Date Noted   Healthcare maintenance 01/31/2022   Insomnia 01/16/2019   Anxiety 01/16/2019   Impotence due to erectile dysfunction 11/12/2018   Alcoholism (HCC) 01/31/2016   Tobacco use disorder 02/22/2015   Molluscum contagiosum infection 10/22/2014   Hyperglycemia 09/11/2014   AIDS (HCC) 08/25/2014   Tachycardia    Essential hypertension 08/24/2014   Genital herpes 08/24/2014   Recurrent pneumonia 08/23/2014   Post-operative state 12/11/2013   Anal condyloma 11/10/2013   Breast abscess in male 07/08/2012    Patient's Medications  New Prescriptions   No medications on file  Previous Medications   ALBUTEROL (VENTOLIN HFA) 108 (90 BASE) MCG/ACT INHALER       ALPRAZOLAM (XANAX) 0.25 MG TABLET    Take 1 tablet (0.25 mg total) by mouth 2 (two) times daily as needed for anxiety.   CABOTEGRAVIR & RILPIVIRINE ER (CABENUVA) 600 & 900 MG/3ML INJECTION    Inject 1 kit into the muscle every 2 (two) months.   DRONABINOL (MARINOL) 5 MG CAPSULE    TAKE ONE CAPSULE BY MOUTH TWICE DAILY BEFORE A MEAL   TADALAFIL (CIALIS) 20 MG TABLET       VALACYCLOVIR (VALTREX) 500 MG TABLET    Take 1 tablet (500 mg total) by mouth daily.  Modified Medications   No medications on file  Discontinued Medications   No medications on file    Allergies: Allergies  Allergen Reactions   Restoril [Temazepam] Other (See Comments)    Violent nightmares    Labs: Lab Results  Component Value Date   HIV1RNAQUANT Not Detected 10/09/2022   HIV1RNAQUANT Not Detected 04/04/2022   HIV1RNAQUANT NOT DETECTED 10/27/2021   CD4TABS 257 (L) 10/09/2022   CD4TABS 294 (L) 04/04/2022   CD4TABS 228 (L) 10/27/2021    RPR and STI Lab Results  Component Value Date   LABRPR NON-REACTIVE 10/09/2022   LABRPR NON-REACTIVE 04/04/2022   LABRPR NON-REACTIVE 10/27/2021    LABRPR REACTIVE (A) 08/02/2021   LABRPR REACTIVE (A) 05/03/2021   RPRTITER 1:1 (H) 08/02/2021   RPRTITER 1:1 (H) 05/03/2021   RPRTITER 1:16 (H) 07/27/2020    STI Results GC CT  10/27/2021  3:44 PM Negative  Negative   05/03/2021  4:16 PM Negative  Negative   07/27/2020  9:23 AM Negative  Negative   08/13/2019 12:00 AM Negative  Negative   10/29/2018 12:00 AM Negative  Negative   07/30/2017 12:00 AM Negative  Negative   01/24/2017 12:00 AM Negative  Negative   08/09/2016 12:00 AM Negative  Negative   01/20/2016 12:00 AM Negative  Negative   08/23/2015 12:00 AM Negative  Negative   08/25/2014 12:00 AM NG: Negative  CT: Negative     Hepatitis B Lab Results  Component Value Date   HEPBSAB NON-REACTIVE 02/07/2017   HEPBSAG NEGATIVE 08/25/2014   Hepatitis C No results found for: "HEPCAB", "HCVRNAPCRQN" Hepatitis A Lab Results  Component Value Date   HAV NON-REACTIVE 04/04/2022   Lipids: Lab Results  Component Value Date   CHOL 220 (H) 10/09/2022   TRIG 166 (H) 10/09/2022   HDL 54 10/09/2022   CHOLHDL 4.1 10/09/2022   VLDL 32 (H) 01/24/2017   LDLCALC 136 (H) 10/09/2022    TARGET DATE: The 11th of the month  Assessment: Casey Hurley presents today for  maintenance Cabenuva injections. Past injections were tolerated well without  issues.  Administered cabotegravir 600mg /58mL in left upper outer quadrant of the gluteal muscle. Administered rilpivirine 900 mg/56mL in the right upper outer quadrant of the gluteal muscle. No issues with injections. He will follow up in 2 months for next set of injections.  Plan: - Cabenuva injections administered - Next injections scheduled for 10/7 at 4pm with Casey Hurley - Call with any issues or questions  Lennie Muckle, PharmD PGY1 Pharmacy Resident 02/06/2023 3:50 PM

## 2023-02-13 ENCOUNTER — Other Ambulatory Visit: Payer: Self-pay | Admitting: Family

## 2023-02-13 MED ORDER — AMOXICILLIN-POT CLAVULANATE 875-125 MG PO TABS: 1.0000 | ORAL_TABLET | Freq: Two times a day (BID) | ORAL | 0 refills | Status: DC

## 2023-02-13 NOTE — Telephone Encounter (Signed)
Pt. message

## 2023-03-08 ENCOUNTER — Other Ambulatory Visit: Payer: Self-pay

## 2023-03-15 ENCOUNTER — Other Ambulatory Visit: Payer: Self-pay

## 2023-03-23 ENCOUNTER — Other Ambulatory Visit (HOSPITAL_COMMUNITY): Payer: Self-pay

## 2023-04-02 ENCOUNTER — Other Ambulatory Visit: Payer: Self-pay

## 2023-04-02 ENCOUNTER — Encounter: Payer: Self-pay | Admitting: Family

## 2023-04-02 ENCOUNTER — Other Ambulatory Visit (HOSPITAL_COMMUNITY): Payer: Self-pay

## 2023-04-02 ENCOUNTER — Ambulatory Visit (INDEPENDENT_AMBULATORY_CARE_PROVIDER_SITE_OTHER): Payer: BC Managed Care – PPO | Admitting: Family

## 2023-04-02 VITALS — BP 136/89 | HR 74 | Temp 97.6°F | Wt 138.0 lb

## 2023-04-02 DIAGNOSIS — Z79899 Other long term (current) drug therapy: Secondary | ICD-10-CM | POA: Diagnosis not present

## 2023-04-02 DIAGNOSIS — Z0000 Encounter for general adult medical examination without abnormal findings: Secondary | ICD-10-CM

## 2023-04-02 DIAGNOSIS — F17200 Nicotine dependence, unspecified, uncomplicated: Secondary | ICD-10-CM | POA: Diagnosis not present

## 2023-04-02 DIAGNOSIS — B20 Human immunodeficiency virus [HIV] disease: Secondary | ICD-10-CM | POA: Diagnosis not present

## 2023-04-02 DIAGNOSIS — Z9189 Other specified personal risk factors, not elsewhere classified: Secondary | ICD-10-CM | POA: Insufficient documentation

## 2023-04-02 MED ORDER — CABOTEGRAVIR & RILPIVIRINE ER 600 & 900 MG/3ML IM SUER
1.0000 | Freq: Once | INTRAMUSCULAR | Status: AC
Start: 2023-04-02 — End: 2023-04-02
  Administered 2023-04-02: 1 via INTRAMUSCULAR

## 2023-04-02 NOTE — Assessment & Plan Note (Signed)
Casey Hurley continues to have well controlled virus with good adherence and tolerance to Guinea .  Reviewed lab work and discussed plan of care, U equals U, and family planning. Check lab work. Continue current dose of Cabenuva . Plan for follow up in  6 months or sooner if needed with lab work on the same day. See Pharmacy Provider for in between injections.

## 2023-04-02 NOTE — Progress Notes (Signed)
Brief Narrative   Patient ID: Casey Hurley, male    DOB: Oct 03, 1970, 52 y.o.   MRN: [id number]  [name] is a 52 y/o male with HIV disease diagnosed in February 2016 with risk factor of heterosexual contact. Initial viral load was 80,520 with CD4 count 20. History of PCP. No genotype available for review. [id number] negative. Entered care at [facility] Stage 3. ART experienced with Genvoya and now Guinea.   Subjective:    Chief Complaint  Patient presents with   HIV Positive/AIDS    HPI:  Casey Hurley is a 52 y.o. male with HIV disease last seen on 02/06/23 by Aggie Cosier, PharmD, CPP with good adherence and tolerance to Cabenuva. Last viral load was undetectable and CD4 count 257. Lipid profile with LDL 136, HDL 54 and triglycerides 166. ASCVD risk of 8.4%. Here today for follow up.   [name] has been doing well since his last office visit and continues to receive Cabenuva with no adverse side effects. Has questions if Cabenuva lowers the immune system or makes you more susceptible to infection as after his last injection acquired Covid about 1 week later and had influenza about 1 week after his last injection prior to that. Colon cancer screening up to date. Condoms and STD testing offered. Recently went to [facility] and was [place].   Denies fevers, chills, night sweats, headaches, changes in vision, neck pain/stiffness, nausea, diarrhea, vomiting, lesions or rashes.  Lab Results  Component Value Date   CD4TCELL 14 (L) 10/09/2022   CD4TABS 257 (L) 10/09/2022   Lab Results  Component Value Date   HIV1RNAQUANT Not Detected 10/09/2022     Allergies  Allergen Reactions   Restoril [Temazepam] Other (See Comments)    Violent nightmares      Outpatient Medications Prior to Visit  Medication Sig Dispense Refill   cabotegravir & rilpivirine ER (CABENUVA) 600 & 900 MG/3ML injection Inject 1 kit into the muscle every 2 (two) months. 6 mL 5   albuterol  (VENTOLIN HFA) 108 (90 Base) MCG/ACT inhaler      ALPRAZolam (XANAX) 0.25 MG tablet Take 1 tablet (0.25 mg total) by mouth 2 (two) times daily as needed for anxiety. (Patient not taking: Reported on 10/09/2022) 10 tablet 0   dronabinol (MARINOL) 5 MG capsule TAKE ONE CAPSULE BY MOUTH TWICE DAILY BEFORE A MEAL 180 capsule 0   tadalafil (CIALIS) 20 MG tablet      valACYclovir (VALTREX) 500 MG tablet Take 1 tablet (500 mg total) by mouth daily. 90 tablet 1   amoxicillin-clavulanate (AUGMENTIN) 875-125 MG tablet Take 1 tablet by mouth 2 (two) times daily. 14 tablet 0   No facility-administered medications prior to visit.     Past Medical History:  Diagnosis Date   Acquired immune deficiency syndrome (HCC)    Genital herpes    HIV (human immunodeficiency virus infection) (HCC)    Hypertension    PCP (pneumocystis carinii pneumonia) (HCC)      Past Surgical History:  Procedure Laterality Date   ANAL EXAMINATION UNDER ANESTHESIA     anal condylomas   VIDEO BRONCHOSCOPY Bilateral 08/25/2014   Procedure: VIDEO BRONCHOSCOPY WITHOUT FLUORO;  Surgeon: Leslye Peer, MD;  Location: [facility];  Service: Cardiopulmonary;  Laterality: Bilateral;      Review of Systems  Constitutional:  Negative for appetite change, chills, fatigue, fever and unexpected weight change.  Eyes:  Negative for visual disturbance.  Respiratory:  Negative for cough, chest  tightness, shortness of breath and wheezing.   Cardiovascular:  Negative for chest pain and leg swelling.  Gastrointestinal:  Negative for abdominal pain, constipation, diarrhea, nausea and vomiting.  Genitourinary:  Negative for dysuria, flank pain, frequency, genital sores, hematuria and urgency.  Skin:  Negative for rash.  Allergic/Immunologic: Negative for immunocompromised state.  Neurological:  Negative for dizziness and headaches.      Objective:    BP 136/89   Pulse 74   Temp 97.6 F (36.4 C) (Oral)   Wt 138 lb (62.6 kg)   SpO2  92%   BMI 19.80 kg/m  Nursing note and vital signs reviewed.  Physical Exam Constitutional:      General: He is not in acute distress.    Appearance: He is well-developed.  Eyes:     Conjunctiva/sclera: Conjunctivae normal.  Cardiovascular:     Rate and Rhythm: Normal rate and regular rhythm.     Heart sounds: Normal heart sounds. No murmur heard.    No friction rub. No gallop.  Pulmonary:     Effort: Pulmonary effort is normal. No respiratory distress.     Breath sounds: Normal breath sounds. No wheezing or rales.  Chest:     Chest wall: No tenderness.  Abdominal:     General: Bowel sounds are normal.     Palpations: Abdomen is soft.     Tenderness: There is no abdominal tenderness.  Musculoskeletal:     Cervical back: Neck supple.  Lymphadenopathy:     Cervical: No cervical adenopathy.  Skin:    General: Skin is warm and dry.     Findings: No rash.  Neurological:     Mental Status: He is alert and oriented to person, place, and time.  Psychiatric:        Behavior: Behavior normal.        Thought Content: Thought content normal.        Judgment: Judgment normal.         04/02/2023    3:51 PM 10/09/2022    4:02 PM 01/31/2022    3:57 PM 05/24/2021    4:03 PM 07/27/2020    8:56 AM  Depression screen PHQ 2/9  Decreased Interest 0 0 0 0 0  Down, Depressed, Hopeless 0 0 0 0 0  PHQ - 2 Score 0 0 0 0 0       Assessment & Plan:    Patient Active Problem List   Diagnosis Date Noted   At increased risk for cardiovascular disease 04/02/2023   Healthcare maintenance 01/31/2022   Insomnia 01/16/2019   Anxiety 01/16/2019   Impotence due to erectile dysfunction 11/12/2018   Alcoholism (HCC) 01/31/2016   Tobacco use disorder 02/22/2015   Molluscum contagiosum infection 10/22/2014   Hyperglycemia 09/11/2014   AIDS (HCC) 08/25/2014   Tachycardia    Essential hypertension 08/24/2014   Genital herpes 08/24/2014   Recurrent pneumonia 08/23/2014   Post-operative state  12/11/2013   Anal condyloma 11/10/2013   Breast abscess in male 07/08/2012     Problem List Items Addressed This Visit       Other   AIDS (HCC) - Primary    Stacy continues to have well controlled virus with good adherence and tolerance to Guinea .  Reviewed lab work and discussed plan of care, U equals U, and family planning. Check lab work. Continue current dose of Cabenuva . Plan for follow up in  6 months or sooner if needed with lab work on the same day.  See Pharmacy Provider for in between injections.        Relevant Orders   COMPLETE METABOLIC PANEL WITH GFR   HIV-1 RNA quant-no reflex-bld   T-helper cell (CD4)- ([facility] clinic only)   Tobacco use disorder    Ned continues to smoke tobacco and has stopped smoking cigarettes and is currently vaping. Counseled on importance of tobacco cessation which is also increasing his ASCVD risk.       Healthcare maintenance    Discussed importance of safe sexual practice and condom use. Condoms and STD testing offered.  Declines vaccinations - will consider Prevnar 20 at next injection.  Dental care up to date. Colonscopy completed in June 2024.       At increased risk for cardiovascular disease    Johah has an ASCVD risk of  8.4% and based on current recommendations would benefit from statin medication to help decrease cardiovascular disease risk and inflammation associated with HIV disease. Reviewed information and would like to await today's lab work results. If interested would start Rosuvastatin 10 mg.       Other Visit Diagnoses     Pharmacologic therapy       Relevant Orders   Lipid panel        I have discontinued Sharlet Salina E. Reinhardt's amoxicillin-clavulanate. I am also having him maintain his albuterol, ALPRAZolam, dronabinol, tadalafil, cabotegravir & rilpivirine ER, and valACYclovir.   Follow-up: Return in about 6 months (around 10/01/2023), or if symptoms worsen or fail to improve. or sooner if needed.     Marcos Eke, MSN, FNP-C Nurse Practitioner [facility] Main number: [phone]

## 2023-04-02 NOTE — Assessment & Plan Note (Signed)
Casey Hurley has an ASCVD risk of  8.4% and based on current recommendations would benefit from statin medication to help decrease cardiovascular disease risk and inflammation associated with HIV disease. Reviewed information and would like to await today's lab work results. If interested would start Rosuvastatin 10 mg.

## 2023-04-02 NOTE — Patient Instructions (Addendum)
Nice to see you.  We will check your lab work today.  Plan for follow up in 6 months or sooner if needed with lab work on the same day and Pharmacy provider in between.   Have a great day and stay safe!

## 2023-04-02 NOTE — Assessment & Plan Note (Signed)
Casey Hurley continues to smoke tobacco and has stopped smoking cigarettes and is currently vaping. Counseled on importance of tobacco cessation which is also increasing his ASCVD risk.

## 2023-04-02 NOTE — Assessment & Plan Note (Signed)
Discussed importance of safe sexual practice and condom use. Condoms and STD testing offered.  Declines vaccinations - will consider Prevnar 20 at next injection.  Dental care up to date. Colonscopy completed in June 2024.

## 2023-04-02 NOTE — Addendum Note (Signed)
Addended by: Tressa Busman T on: 04/02/2023 04:30 PM   Modules accepted: Orders

## 2023-04-03 LAB — T-HELPER CELL (CD4) - (RCID CLINIC ONLY)
CD4 % Helper T Cell: 17 % — ABNORMAL LOW (ref 33–65)
CD4 T Cell Abs: 203 /uL — ABNORMAL LOW (ref 400–1790)

## 2023-04-04 LAB — HIV-1 RNA QUANT-NO REFLEX-BLD
HIV 1 RNA Quant: NOT DETECTED {copies}/mL
HIV-1 RNA Quant, Log: NOT DETECTED {Log}

## 2023-04-04 LAB — COMPLETE METABOLIC PANEL WITH GFR
AG Ratio: 1.6 (calc) (ref 1.0–2.5)
ALT: 14 U/L (ref 9–46)
AST: 17 U/L (ref 10–40)
Albumin: 4.6 g/dL (ref 3.6–5.1)
Alkaline phosphatase (APISO): 91 U/L (ref 36–130)
BUN: 16 mg/dL (ref 7–25)
CO2: 27 mmol/L (ref 20–32)
Calcium: 9.9 mg/dL (ref 8.6–10.3)
Chloride: 101 mmol/L (ref 98–110)
Creat: 1.24 mg/dL (ref 0.60–1.29)
Globulin: 2.9 g/dL (ref 1.9–3.7)
Glucose, Bld: 74 mg/dL (ref 65–99)
Potassium: 4.3 mmol/L (ref 3.5–5.3)
Sodium: 139 mmol/L (ref 135–146)
Total Bilirubin: 0.7 mg/dL (ref 0.2–1.2)
Total Protein: 7.5 g/dL (ref 6.1–8.1)
eGFR: 71 mL/min/{1.73_m2} (ref >=60)

## 2023-04-04 LAB — LIPID PANEL
Cholesterol: 174 mg/dL (ref <200)
HDL: 56 mg/dL (ref >=40)
LDL Cholesterol (Calc): 81 mg/dL
Non-HDL Cholesterol (Calc): 118 mg/dL (ref <130)
Total CHOL/HDL Ratio: 3.1 (calc) (ref <5.0)
Triglycerides: 282 mg/dL — ABNORMAL HIGH (ref <150)

## 2023-05-29 NOTE — Progress Notes (Unsigned)
HPI: Casey Hurley is a 52 y.o. male who presents to the [facility] pharmacy clinic for [place] administration.  Patient Active Problem List   Diagnosis Date Noted   At increased risk for cardiovascular disease 04/02/2023   Healthcare maintenance 01/31/2022   Insomnia 01/16/2019   Anxiety 01/16/2019   Impotence due to erectile dysfunction 11/12/2018   Alcoholism (HCC) 01/31/2016   Tobacco use disorder 02/22/2015   Molluscum contagiosum infection 10/22/2014   Hyperglycemia 09/11/2014   AIDS (HCC) 08/25/2014   Tachycardia    Essential hypertension 08/24/2014   Genital herpes 08/24/2014   Recurrent pneumonia 08/23/2014   Post-operative state 12/11/2013   Anal condyloma 11/10/2013   Breast abscess in male 07/08/2012    Patient's Medications  New Prescriptions   No medications on file  Previous Medications   ALBUTEROL (VENTOLIN HFA) 108 (90 BASE) MCG/ACT INHALER       ALPRAZOLAM (XANAX) 0.25 MG TABLET    Take 1 tablet (0.25 mg total) by mouth 2 (two) times daily as needed for anxiety.   CABOTEGRAVIR & RILPIVIRINE ER (CABENUVA) 600 & 900 MG/3ML INJECTION    Inject 1 kit into the muscle every 2 (two) months.   DRONABINOL (MARINOL) 5 MG CAPSULE    TAKE ONE CAPSULE BY MOUTH TWICE DAILY BEFORE A MEAL   TADALAFIL (CIALIS) 20 MG TABLET       VALACYCLOVIR (VALTREX) 500 MG TABLET    Take 1 tablet (500 mg total) by mouth daily.  Modified Medications   No medications on file  Discontinued Medications   No medications on file    Allergies: Allergies  Allergen Reactions   Restoril [Temazepam] Other (See Comments)    Violent nightmares    Labs: Lab Results  Component Value Date   HIV1RNAQUANT Not Detected 04/02/2023   HIV1RNAQUANT Not Detected 10/09/2022   HIV1RNAQUANT Not Detected 04/04/2022   CD4TABS 203 (L) 04/02/2023   CD4TABS 257 (L) 10/09/2022   CD4TABS 294 (L) 04/04/2022    RPR and STI Lab Results  Component Value Date   LABRPR NON-REACTIVE 10/09/2022   LABRPR  NON-REACTIVE 04/04/2022   LABRPR NON-REACTIVE 10/27/2021   LABRPR REACTIVE (A) 08/02/2021   LABRPR REACTIVE (A) 05/03/2021   RPRTITER 1:1 (H) 08/02/2021   RPRTITER 1:1 (H) 05/03/2021   RPRTITER 1:16 (H) 07/27/2020    STI Results GC CT  10/27/2021  3:44 PM Negative  Negative   05/03/2021  4:16 PM Negative  Negative   07/27/2020  9:23 AM Negative  Negative   08/13/2019 12:00 AM Negative  Negative   10/29/2018 12:00 AM Negative  Negative   07/30/2017 12:00 AM Negative  Negative   01/24/2017 12:00 AM Negative  Negative   08/09/2016 12:00 AM Negative  Negative   01/20/2016 12:00 AM Negative  Negative   08/23/2015 12:00 AM Negative  Negative   08/25/2014 12:00 AM NG: Negative  CT: Negative     Hepatitis B Lab Results  Component Value Date   HEPBSAB NON-REACTIVE 02/07/2017   HEPBSAG NEGATIVE 08/25/2014   Hepatitis C No results found for: "HEPCAB", "HCVRNAPCRQN" Hepatitis A Lab Results  Component Value Date   HAV NON-REACTIVE 04/04/2022   Lipids: Lab Results  Component Value Date   CHOL 174 04/02/2023   TRIG 282 (H) 04/02/2023   HDL 56 04/02/2023   CHOLHDL 3.1 04/02/2023   VLDL 32 (H) 01/24/2017   LDLCALC 81 04/02/2023    TARGET DATE: The 11th  Assessment: Shloime presents today for his maintenance Cabenuva injections. Past injections  were tolerated well without issues. Last HIV RNA was undetectable in October; will defer today. Getting new insurance for 2025. Will send [place] his updated information once he receives the card in the mail. Agrees to receive Prevnar20 today.   Administered cabotegravir 600mg /60mL in left upper outer quadrant of the gluteal muscle. Administered rilpivirine 900 mg/36mL in the right upper outer quadrant of the gluteal muscle. No issues with injections. He will follow up in 2 months for next set of injections.  Plan: - Cabenuva injections administered - Prevnar20 vaccine administered - Next injections scheduled for 07/31/23 with Marchelle Folks - Call with any  issues or questions  Delores Thelen L. Daman Steffenhagen, PharmD, BCIDP, AAHIVP, CPP Clinical Pharmacist Practitioner Infectious Diseases Clinical Pharmacist [facility]

## 2023-05-30 ENCOUNTER — Other Ambulatory Visit: Payer: Self-pay

## 2023-05-30 ENCOUNTER — Ambulatory Visit (INDEPENDENT_AMBULATORY_CARE_PROVIDER_SITE_OTHER): Payer: BC Managed Care – PPO | Admitting: Pharmacist

## 2023-05-30 DIAGNOSIS — Z23 Encounter for immunization: Secondary | ICD-10-CM

## 2023-05-30 DIAGNOSIS — B20 Human immunodeficiency virus [HIV] disease: Secondary | ICD-10-CM | POA: Diagnosis not present

## 2023-05-30 MED ORDER — CABOTEGRAVIR & RILPIVIRINE ER 600 & 900 MG/3ML IM SUER
1.0000 | Freq: Once | INTRAMUSCULAR | Status: AC
Start: 2023-05-30 — End: 2023-05-30
  Administered 2023-05-30: 1 via INTRAMUSCULAR

## 2023-07-27 NOTE — Progress Notes (Signed)
 HPI: Casey Hurley is a 53 y.o. male who presents to the [facility] pharmacy clinic for Cabenuva  administration.  Patient Active Problem List   Diagnosis Date Noted   At increased risk for cardiovascular disease 04/02/2023   Healthcare maintenance 01/31/2022   Insomnia 01/16/2019   Anxiety 01/16/2019   Impotence due to erectile dysfunction 11/12/2018   Alcoholism (HCC) 01/31/2016   Tobacco use disorder 02/22/2015   Molluscum contagiosum infection 10/22/2014   Hyperglycemia 09/11/2014   AIDS (HCC) 08/25/2014   Tachycardia    Essential hypertension 08/24/2014   Genital herpes 08/24/2014   Recurrent pneumonia 08/23/2014   Post-operative state 12/11/2013   Anal condyloma 11/10/2013   Breast abscess in male 07/08/2012    Patient's Medications  New Prescriptions   No medications on file  Previous Medications   ALBUTEROL  (VENTOLIN  HFA) 108 (90 BASE) MCG/ACT INHALER       ALPRAZOLAM  (XANAX ) 0.25 MG TABLET    Take 1 tablet (0.25 mg total) by mouth 2 (two) times daily as needed for anxiety.   CABOTEGRAVIR  & RILPIVIRINE  ER (CABENUVA ) 600 & 900 MG/3ML INJECTION    Inject 1 kit into the muscle every 2 (two) months.   DRONABINOL  (MARINOL ) 5 MG CAPSULE    TAKE ONE CAPSULE BY MOUTH TWICE DAILY BEFORE A MEAL   TADALAFIL (CIALIS) 20 MG TABLET       VALACYCLOVIR  (VALTREX ) 500 MG TABLET    Take 1 tablet (500 mg total) by mouth daily.  Modified Medications   No medications on file  Discontinued Medications   No medications on file    Allergies: Allergies  Allergen Reactions   Restoril  [Temazepam ] Other (See Comments)    Violent nightmares    Past Medical History: Past Medical History:  Diagnosis Date   Acquired immune deficiency syndrome (HCC)    Genital herpes    HIV (human immunodeficiency virus infection) (HCC)    Hypertension    PCP (pneumocystis carinii pneumonia) (HCC)     Social History: Social History   Socioeconomic History   Marital status: Married    Spouse name:  Not on file   Number of children: Not on file   Years of education: Not on file   Highest education level: Not on file  Occupational History   Not on file  Tobacco Use   Smoking status: Every Day    Current packs/day: 1.00    Average packs/day: 1 pack/day for 9.1 years (9.1 ttl pk-yrs)    Types: Cigarettes    Start date: 06/02/2014   Smokeless tobacco: Never  Substance and Sexual Activity   Alcohol use: Yes    Alcohol/week: 6.0 standard drinks of alcohol    Types: 6 Cans of beer per week   Drug use: Yes    Types: Marijuana   Sexual activity: Yes    Partners: Female    Comment: declined condoms  Other Topics Concern   Not on file  Social History Narrative   Not on file   Social Drivers of Health   Financial Resource Strain: Not on file  Food Insecurity: Not on file  Transportation Needs: Not on file  Physical Activity: Not on file  Stress: Not on file  Social Connections: Not on file    Labs: Lab Results  Component Value Date   HIV1RNAQUANT Not Detected 04/02/2023   HIV1RNAQUANT Not Detected 10/09/2022   HIV1RNAQUANT Not Detected 04/04/2022   CD4TABS 203 (L) 04/02/2023   CD4TABS 257 (L) 10/09/2022   CD4TABS 294 (L)  04/04/2022    RPR and STI Lab Results  Component Value Date   LABRPR NON-REACTIVE 10/09/2022   LABRPR NON-REACTIVE 04/04/2022   LABRPR NON-REACTIVE 10/27/2021   LABRPR REACTIVE (A) 08/02/2021   LABRPR REACTIVE (A) 05/03/2021   RPRTITER 1:1 (H) 08/02/2021   RPRTITER 1:1 (H) 05/03/2021   RPRTITER 1:16 (H) 07/27/2020    STI Results GC CT  10/27/2021  3:44 PM Negative  Negative   05/03/2021  4:16 PM Negative  Negative   07/27/2020  9:23 AM Negative  Negative   08/13/2019 12:00 AM Negative  Negative   10/29/2018 12:00 AM Negative  Negative   07/30/2017 12:00 AM Negative  Negative   01/24/2017 12:00 AM Negative  Negative   08/09/2016 12:00 AM Negative  Negative   01/20/2016 12:00 AM Negative  Negative   08/23/2015 12:00 AM Negative  Negative    08/25/2014 12:00 AM NG: Negative  CT: Negative     Hepatitis B Lab Results  Component Value Date   HEPBSAB NON-REACTIVE 02/07/2017   HEPBSAG NEGATIVE 08/25/2014   Hepatitis C No results found for: HEPCAB, HCVRNAPCRQN Hepatitis A Lab Results  Component Value Date   HAV NON-REACTIVE 04/04/2022   Lipids: Lab Results  Component Value Date   CHOL 174 04/02/2023   TRIG 282 (H) 04/02/2023   HDL 56 04/02/2023   CHOLHDL 3.1 04/02/2023   VLDL 32 (H) 01/24/2017   LDLCALC 81 04/02/2023    TARGET DATE:  The 11th of the month  Assessment: Casey Hurley presents today for their maintenance Cabenuva  injections. Initial/past injections were tolerated well without issues. No problems with systemic effects of injections.   Administered cabotegravir  600mg /43mL in left upper outer quadrant of the gluteal muscle. Administered rilpivirine  900 mg/3mL in the right upper outer quadrant of the gluteal muscle. Monitored patient for 10 minutes after injection. Injections were tolerated well without issue. Patient will follow up in 2 months for next injection. Will defer HIV RNA testing until next visit.  Due for HAV, HBV, COVID, flu, and Menveo vaccines. He continues to decline all.   Requests refill on valacyclovir  today which I sent to preferred pharmacy.   Plan: - Cabenuva  injections administered - Next injections scheduled for 4/14 with Casey Hurley and 6/5 with me  - Call with any issues or questions  Alan Geralds, PharmD, CPP, BCIDP, AAHIVP Clinical Pharmacist Practitioner Infectious Diseases Clinical Pharmacist [facility]

## 2023-07-31 ENCOUNTER — Other Ambulatory Visit: Payer: Self-pay

## 2023-07-31 ENCOUNTER — Ambulatory Visit (INDEPENDENT_AMBULATORY_CARE_PROVIDER_SITE_OTHER): Payer: BC Managed Care – PPO | Admitting: Pharmacist

## 2023-07-31 DIAGNOSIS — B20 Human immunodeficiency virus [HIV] disease: Secondary | ICD-10-CM | POA: Diagnosis not present

## 2023-07-31 MED ORDER — VALACYCLOVIR HCL 500 MG PO TABS: 500.0000 mg | ORAL_TABLET | Freq: Every day | ORAL | 2 refills | Status: AC

## 2023-07-31 MED ORDER — CABOTEGRAVIR & RILPIVIRINE ER 600 & 900 MG/3ML IM SUER
1.0000 | Freq: Once | INTRAMUSCULAR | Status: AC
Start: 2023-07-31 — End: 2023-07-31
  Administered 2023-07-31: 1 via INTRAMUSCULAR

## 2023-10-05 ENCOUNTER — Other Ambulatory Visit (HOSPITAL_COMMUNITY): Payer: Self-pay

## 2023-10-08 ENCOUNTER — Other Ambulatory Visit: Payer: Self-pay

## 2023-10-08 ENCOUNTER — Other Ambulatory Visit (HOSPITAL_COMMUNITY): Payer: Self-pay

## 2023-10-08 ENCOUNTER — Encounter: Payer: Self-pay | Admitting: Family

## 2023-10-08 ENCOUNTER — Ambulatory Visit (INDEPENDENT_AMBULATORY_CARE_PROVIDER_SITE_OTHER): Payer: BC Managed Care – PPO | Admitting: Family

## 2023-10-08 VITALS — BP 138/89 | HR 89 | Temp 98.2°F | Ht 70.5 in | Wt 145.0 lb

## 2023-10-08 DIAGNOSIS — F17200 Nicotine dependence, unspecified, uncomplicated: Secondary | ICD-10-CM | POA: Diagnosis not present

## 2023-10-08 DIAGNOSIS — B20 Human immunodeficiency virus [HIV] disease: Secondary | ICD-10-CM | POA: Diagnosis not present

## 2023-10-08 DIAGNOSIS — Z0000 Encounter for general adult medical examination without abnormal findings: Secondary | ICD-10-CM

## 2023-10-08 MED ORDER — CABOTEGRAVIR & RILPIVIRINE ER 600 & 900 MG/3ML IM SUER
1.0000 | Freq: Once | INTRAMUSCULAR | Status: AC
Start: 2023-10-08 — End: 2023-10-08
  Administered 2023-10-08: 1 via INTRAMUSCULAR

## 2023-10-08 NOTE — Assessment & Plan Note (Signed)
 Counseled on the dangers of tobacco not ready to quit at this time.  Reviewed strategies to maximize success, including removing cigarettes and smoking materials from environment, stress management, substitution of other forms of reinforcement, support of family/friends, and written materials.

## 2023-10-08 NOTE — Assessment & Plan Note (Signed)
 Casey Hurley continues to have well-controlled virus with good adherence and tolerance to Cabenuva.  Reviewed previous lab work and discussed plan of care and U equals U.  Check blood work.  Cabenuva injection provided without complication.  Social determinants of health reviewed with no interventions indicated.  Plan for follow-up in 6 months or sooner if needed with lab work on the same day and every 2 months with pharmacy provider in between.

## 2023-10-08 NOTE — Progress Notes (Signed)
 Brief Narrative   Patient ID: Casey Hurley, male    DOB: [birth year], 53 y.o.   MRN: [id number]  Casey Hurley is a 53 y/o caucasian male with HIV disease diagnosed in February 2016 with risk factor of heterosexual contact. Initial viral load was 80,520 with CD4 count 20. Entered care at [facility] Stage 3. History of pneumocystis pneumonia with no other opportunistic infection. [id number] negative. ART experienced with [facility] and now [place].   Subjective:    Chief Complaint  Patient presents with   Follow-up    B20    HPI:  Casey Hurley is a 53 y.o. male with HIV disease last seen by Casey Hurley, PharmD, CPP on 07/31/2023 with well-controlled virus and good adherence and tolerance to Cabenuva.  Viral load was undetectable with CD4 count of 203.  Previous lab work with renal function, hepatic function, and electrolytes within normal ranges.  Lipid profile triglycerides 282, LDL 81, and HDL 56.  Here today for follow-up and Cabenuva injection.  Casey Hurley has been doing well since his last office visit and continues to receive Cabenuva with no adverse side effects.  Uses nonsteroidal anti-inflammatories as needed for discomfort with injections.  No new concerns/complaints.  He did receive a bill for his last injection and is requesting further evaluation.  Condoms and site-specific STD testing offered.  Healthcare maintenance reviewed.  Routine dental care is up-to-date per recommendations.  Vaping daily.  Housing, access to food, and transportation are stable.  Denies fevers, chills, night sweats, headaches, changes in vision, neck pain/stiffness, nausea, diarrhea, vomiting, lesions or rashes.  Lab Results  Component Value Date   CD4TCELL 17 (L) 04/02/2023   CD4TABS 203 (L) 04/02/2023   Lab Results  Component Value Date   HIV1RNAQUANT Not Detected 04/02/2023     Allergies  Allergen Reactions   Restoril [Temazepam] Other (See Comments)    Violent nightmares      Outpatient  Medications Prior to Visit  Medication Sig Dispense Refill   cabotegravir & rilpivirine ER (CABENUVA) 600 & 900 MG/3ML injection Inject 1 kit into the muscle every 2 (two) months. 6 mL 5   dronabinol (MARINOL) 5 MG capsule TAKE ONE CAPSULE BY MOUTH TWICE DAILY BEFORE A MEAL 180 capsule 0   tadalafil (CIALIS) 20 MG tablet      valACYclovir (VALTREX) 500 MG tablet Take 1 tablet (500 mg total) by mouth daily. 90 tablet 2   albuterol (VENTOLIN HFA) 108 (90 Base) MCG/ACT inhaler  (Patient not taking: Reported on 10/08/2023)     ALPRAZolam (XANAX) 0.25 MG tablet Take 1 tablet (0.25 mg total) by mouth 2 (two) times daily as needed for anxiety. (Patient not taking: Reported on 10/08/2023) 10 tablet 0   No facility-administered medications prior to visit.     Past Medical History:  Diagnosis Date   Acquired immune deficiency syndrome (HCC)    Genital herpes    HIV (human immunodeficiency virus infection) (HCC)    Hypertension    PCP (pneumocystis carinii pneumonia) (HCC)      Past Surgical History:  Procedure Laterality Date   ANAL EXAMINATION UNDER ANESTHESIA     anal condylomas   VIDEO BRONCHOSCOPY Bilateral 08/25/2014   Procedure: VIDEO BRONCHOSCOPY WITHOUT FLUORO;  Surgeon: Leslye Peer, MD;  Location: [facility];  Service: Cardiopulmonary;  Laterality: Bilateral;      Review of Systems  Constitutional:  Negative for appetite change, chills, fatigue, fever and unexpected weight change.  Eyes:  Negative for visual  disturbance.  Respiratory:  Negative for cough, chest tightness, shortness of breath and wheezing.   Cardiovascular:  Negative for chest pain and leg swelling.  Gastrointestinal:  Negative for abdominal pain, constipation, diarrhea, nausea and vomiting.  Genitourinary:  Negative for dysuria, flank pain, frequency, genital sores, hematuria and urgency.  Skin:  Negative for rash.  Allergic/Immunologic: Negative for immunocompromised state.  Neurological:  Negative for  dizziness and headaches.      Objective:    BP 138/89   Pulse 89   Temp 98.2 F (36.8 C) (Temporal)   Ht 5' 10.5" (1.791 m)   Wt 145 lb (65.8 kg)   SpO2 97%   BMI 20.51 kg/m  Nursing note and vital signs reviewed.  Physical Exam Constitutional:      General: He is not in acute distress.    Appearance: He is well-developed.  Eyes:     Conjunctiva/sclera: Conjunctivae normal.  Cardiovascular:     Rate and Rhythm: Normal rate and regular rhythm.     Heart sounds: Normal heart sounds. No murmur heard.    No friction rub. No gallop.  Pulmonary:     Effort: Pulmonary effort is normal. No respiratory distress.     Breath sounds: Normal breath sounds. No wheezing or rales.  Chest:     Chest wall: No tenderness.  Abdominal:     General: Bowel sounds are normal.     Palpations: Abdomen is soft.     Tenderness: There is no abdominal tenderness.  Musculoskeletal:     Cervical back: Neck supple.  Lymphadenopathy:     Cervical: No cervical adenopathy.  Skin:    General: Skin is warm and dry.     Findings: No rash.  Neurological:     Mental Status: He is alert and oriented to person, place, and time.  Psychiatric:        Behavior: Behavior normal.        Thought Content: Thought content normal.        Judgment: Judgment normal.         10/08/2023    3:48 PM 04/02/2023    3:51 PM 10/09/2022    4:02 PM 01/31/2022    3:57 PM 05/24/2021    4:03 PM  Depression screen PHQ 2/9  Decreased Interest 0 0 0 0 0  Down, Depressed, Hopeless 0 0 0 0 0  PHQ - 2 Score 0 0 0 0 0        10/08/2023    3:48 PM  GAD 7 : Generalized Anxiety Score  Nervous, Anxious, on Edge 0   The 10-year ASCVD risk score (Arnett DK, et al., 2019) is: 2.9%   Values used to calculate the score:     Age: 89 years     Sex: Male     Is Non-Hispanic African American: No     Diabetic: No     Tobacco smoker: No     Systolic Blood Pressure: 138 mmHg     Is BP treated: No     HDL Cholesterol: 56 mg/dL      Total Cholesterol: 174 mg/dL       Assessment & Plan:    Patient Active Problem List   Diagnosis Date Noted   At increased risk for cardiovascular disease 04/02/2023   Healthcare maintenance 01/31/2022   Insomnia 01/16/2019   Anxiety 01/16/2019   Impotence due to erectile dysfunction 11/12/2018   Alcoholism (HCC) 01/31/2016   Tobacco use disorder 02/22/2015   Molluscum contagiosum  infection 10/22/2014   Hyperglycemia 09/11/2014   AIDS (HCC) 08/25/2014   Tachycardia    Essential hypertension 08/24/2014   Genital herpes 08/24/2014   Recurrent pneumonia 08/23/2014   Post-operative state 12/11/2013   Anal condyloma 11/10/2013   Breast abscess in male 07/08/2012     Problem List Items Addressed This Visit       Other   AIDS [facility])   Mr. Walder continues to have well-controlled virus with good adherence and tolerance to Cabenuva.  Reviewed previous lab work and discussed plan of care and U equals U.  Check blood work.  Cabenuva injection provided without complication.  Social determinants of health reviewed with no interventions indicated.  Plan for follow-up in 6 months or sooner if needed with lab work on the same day and every 2 months with pharmacy provider in between.      Tobacco use disorder   Counseled on the dangers of tobacco not ready to quit at this time.  Reviewed strategies to maximize success, including removing cigarettes and smoking materials from environment, stress management, substitution of other forms of reinforcement, support of family/friends, and written materials.        Healthcare maintenance   Discussed importance of safe sexual practice and condom use. Condoms and site specific STD testing offered.  Vaccinations reviewed and declined following counseling. Routine dental care up to date. Colon cancer screening up to date.       Other Visit Diagnoses       HIV disease (HCC)    -  Primary   Relevant Medications   cabotegravir & rilpivirine ER  (CABENUVA) 600 & 900 MG/3ML injection 1 kit (Completed)   Other Relevant Orders   COMPLETE METABOLIC PANEL WITHOUT GFR   HIV-1 RNA quant-no reflex-bld   T-helper cell (CD4)- ([facility] clinic only)        I am having Marylu Soda maintain his albuterol, ALPRAZolam, dronabinol, tadalafil, cabotegravir & rilpivirine ER, and valACYclovir. We administered cabotegravir & rilpivirine ER.   Meds ordered this encounter  Medications   cabotegravir & rilpivirine ER (CABENUVA) 600 & 900 MG/3ML injection 1 kit     Follow-up: Return in about 6 months (around 04/08/2024). or sooner if needed.    Marlan Silva, MSN, FNP-C Nurse Practitioner [facility] for Infectious Disease [facility] Main number: [phone]

## 2023-10-08 NOTE — Patient Instructions (Addendum)
 Nice to see you.  We will check your lab work today.  Plan for follow up in 6 months or sooner if needed with lab work on the same day and Pharmacy Provider every 2 months in between.   Have a great day and stay safe!

## 2023-10-08 NOTE — Assessment & Plan Note (Signed)
 Discussed importance of safe sexual practice and condom use. Condoms and site specific STD testing offered.  Vaccinations reviewed and declined following counseling. Routine dental care up to date. Colon cancer screening up to date.

## 2023-10-09 LAB — T-HELPER CELL (CD4) - (RCID CLINIC ONLY)
CD4 % Helper T Cell: 14 % — ABNORMAL LOW (ref 33–65)
CD4 T Cell Abs: 208 /uL — ABNORMAL LOW (ref 400–1790)

## 2023-10-11 LAB — HIV-1 RNA QUANT-NO REFLEX-BLD
HIV 1 RNA Quant: NOT DETECTED {copies}/mL
HIV-1 RNA Quant, Log: NOT DETECTED {Log_copies}/mL

## 2023-10-11 LAB — COMPLETE METABOLIC PANEL WITHOUT GFR
AG Ratio: 2 (calc) (ref 1.0–2.5)
ALT: 13 U/L (ref 9–46)
AST: 20 U/L (ref 10–35)
Albumin: 4.9 g/dL (ref 3.6–5.1)
Alkaline phosphatase (APISO): 80 U/L (ref 35–144)
BUN: 20 mg/dL (ref 7–25)
CO2: 30 mmol/L (ref 20–32)
Calcium: 9.8 mg/dL (ref 8.6–10.3)
Chloride: 102 mmol/L (ref 98–110)
Creat: 1.3 mg/dL (ref 0.70–1.30)
Globulin: 2.5 g/dL (ref 1.9–3.7)
Glucose, Bld: 71 mg/dL (ref 65–99)
Potassium: 4.4 mmol/L (ref 3.5–5.3)
Sodium: 140 mmol/L (ref 135–146)
Total Bilirubin: 1 mg/dL (ref 0.2–1.2)
Total Protein: 7.4 g/dL (ref 6.1–8.1)

## 2023-11-29 ENCOUNTER — Ambulatory Visit: Payer: Self-pay | Admitting: Pharmacist

## 2023-11-29 ENCOUNTER — Other Ambulatory Visit: Payer: Self-pay

## 2023-11-29 DIAGNOSIS — B20 Human immunodeficiency virus [HIV] disease: Secondary | ICD-10-CM

## 2023-11-29 MED ORDER — CABOTEGRAVIR & RILPIVIRINE ER 600 & 900 MG/3ML IM SUER
1.0000 | Freq: Once | INTRAMUSCULAR | Status: AC
  Administered 2023-11-29: 1 via INTRAMUSCULAR

## 2023-11-29 NOTE — Progress Notes (Signed)
 HPI: Casey Hurley is a 53 y.o. male who presents to the [facility] pharmacy clinic for Cabenuva  administration.  He states he has remained well. He does not have any complaints and concerns.   Patient Active Problem List   Diagnosis Date Noted   At increased risk for cardiovascular disease 04/02/2023   Healthcare maintenance 01/31/2022   Insomnia 01/16/2019   Anxiety 01/16/2019   Impotence due to erectile dysfunction 11/12/2018   Alcoholism (HCC) 01/31/2016   Tobacco use disorder 02/22/2015   Molluscum contagiosum infection 10/22/2014   Hyperglycemia 09/11/2014   AIDS (HCC) 08/25/2014   Tachycardia    Essential hypertension 08/24/2014   Genital herpes 08/24/2014   Recurrent pneumonia 08/23/2014   Post-operative state 12/11/2013   Anal condyloma 11/10/2013   Breast abscess in male 07/08/2012    Patient's Medications  New Prescriptions   No medications on file  Previous Medications   ALBUTEROL  (VENTOLIN  HFA) 108 (90 BASE) MCG/ACT INHALER       ALPRAZOLAM  (XANAX ) 0.25 MG TABLET    Take 1 tablet (0.25 mg total) by mouth 2 (two) times daily as needed for anxiety.   CABOTEGRAVIR  & RILPIVIRINE  ER (CABENUVA ) 600 & 900 MG/3ML INJECTION    Inject 1 kit into the muscle every 2 (two) months.   DRONABINOL  (MARINOL ) 5 MG CAPSULE    TAKE ONE CAPSULE BY MOUTH TWICE DAILY BEFORE A MEAL   TADALAFIL (CIALIS) 20 MG TABLET       VALACYCLOVIR  (VALTREX ) 500 MG TABLET    Take 1 tablet (500 mg total) by mouth daily.  Modified Medications   No medications on file  Discontinued Medications   No medications on file    Allergies: Allergies  Allergen Reactions   Restoril  [Temazepam ] Other (See Comments)    Violent nightmares    Labs: Lab Results  Component Value Date   HIV1RNAQUANT NOT DETECTED 10/08/2023   HIV1RNAQUANT Not Detected 04/02/2023   HIV1RNAQUANT Not Detected 10/09/2022   CD4TABS 208 (L) 10/08/2023   CD4TABS 203 (L) 04/02/2023   CD4TABS 257 (L) 10/09/2022    RPR and  STI Lab Results  Component Value Date   LABRPR NON-REACTIVE 10/09/2022   LABRPR NON-REACTIVE 04/04/2022   LABRPR NON-REACTIVE 10/27/2021   LABRPR REACTIVE (A) 08/02/2021   LABRPR REACTIVE (A) 05/03/2021   RPRTITER 1:1 (H) 08/02/2021   RPRTITER 1:1 (H) 05/03/2021   RPRTITER 1:16 (H) 07/27/2020    STI Results GC CT  10/27/2021  3:44 PM Negative  Negative   05/03/2021  4:16 PM Negative  Negative   07/27/2020  9:23 AM Negative  Negative   08/13/2019 12:00 AM Negative  Negative   10/29/2018 12:00 AM Negative  Negative   07/30/2017 12:00 AM Negative  Negative   01/24/2017 12:00 AM Negative  Negative   08/09/2016 12:00 AM Negative  Negative   01/20/2016 12:00 AM Negative  Negative   08/23/2015 12:00 AM Negative  Negative   08/25/2014 12:00 AM NG: Negative  CT: Negative     Hepatitis B Lab Results  Component Value Date   HEPBSAB NON-REACTIVE 02/07/2017   HEPBSAG NEGATIVE 08/25/2014   Hepatitis C No results found for: "HEPCAB", "HCVRNAPCRQN" Hepatitis A Lab Results  Component Value Date   HAV NON-REACTIVE 04/04/2022   Lipids: Lab Results  Component Value Date   CHOL 174 04/02/2023   TRIG 282 (H) 04/02/2023   HDL 56 04/02/2023   CHOLHDL 3.1 04/02/2023   VLDL 32 (H) 01/24/2017   LDLCALC 81 04/02/2023  TARGET DATE: The 11th of each month  Assessment: Casey Hurley presents today for his maintenance Cabenuva  injections. Past injections were tolerated well without issues. Last HIV RNA was undetectable in April 2025. Doing well with no issues today.  Administered cabotegravir  600mg /39mL in left upper outer quadrant of the gluteal muscle. Administered rilpivirine  900 mg/3mL in the right upper outer quadrant of the gluteal muscle. No issues with injections. He will follow up in 2 months for next set of injections.  Eligible of 1/2 Shingrix vaccine, Menveo, and HBV. He defers this today.  He defers STI testing today.   Plan: - Cabenuva  injections administered - Next injections  scheduled for 2 months on 08/12 with RPh - Call with any issues or questions  Tolu Rihaan Barrack, PharmD [facility] PGY-1

## 2024-01-29 DIAGNOSIS — R2231 Localized swelling, mass and lump, right upper limb: Secondary | ICD-10-CM | POA: Diagnosis not present

## 2024-01-29 DIAGNOSIS — L02414 Cutaneous abscess of left upper limb: Secondary | ICD-10-CM | POA: Diagnosis not present

## 2024-01-29 DIAGNOSIS — S40861A Insect bite (nonvenomous) of right upper arm, initial encounter: Secondary | ICD-10-CM | POA: Diagnosis not present

## 2024-02-05 ENCOUNTER — Other Ambulatory Visit: Payer: Self-pay

## 2024-02-05 ENCOUNTER — Ambulatory Visit: Admitting: Pharmacist

## 2024-02-05 DIAGNOSIS — B20 Human immunodeficiency virus [HIV] disease: Secondary | ICD-10-CM | POA: Diagnosis not present

## 2024-02-05 DIAGNOSIS — B3741 Candidal cystitis and urethritis: Secondary | ICD-10-CM

## 2024-02-05 MED ORDER — FLUCONAZOLE 150 MG PO TABS: 150.0000 mg | ORAL_TABLET | Freq: Every day | ORAL | 0 refills | Status: DC

## 2024-02-05 MED ORDER — CABOTEGRAVIR & RILPIVIRINE ER 600 & 900 MG/3ML IM SUER
1.0000 | Freq: Once | INTRAMUSCULAR | Status: AC
  Administered 2024-02-05 (×2): 1 via INTRAMUSCULAR

## 2024-02-05 NOTE — Progress Notes (Signed)
 HPI: Casey Hurley is a 53 y.o. male who presents to the [facility] pharmacy clinic for Cabenuva  administration.  Patient Active Problem List   Diagnosis Date Noted   At increased risk for cardiovascular disease 04/02/2023   Healthcare maintenance 01/31/2022   Insomnia 01/16/2019   Anxiety 01/16/2019   Impotence due to erectile dysfunction 11/12/2018   Alcoholism (HCC) 01/31/2016   Tobacco use disorder 02/22/2015   Molluscum contagiosum infection 10/22/2014   Hyperglycemia 09/11/2014   AIDS (HCC) 08/25/2014   Tachycardia    Essential hypertension 08/24/2014   Genital herpes 08/24/2014   Recurrent pneumonia 08/23/2014   Post-operative state 12/11/2013   Anal condyloma 11/10/2013   Breast abscess in male 07/08/2012    Patient's Medications  New Prescriptions   FLUCONAZOLE  (DIFLUCAN ) 150 MG TABLET    Take 1 tablet (150 mg total) by mouth daily.  Previous Medications   ALBUTEROL  (VENTOLIN  HFA) 108 (90 BASE) MCG/ACT INHALER       ALPRAZOLAM  (XANAX ) 0.25 MG TABLET    Take 1 tablet (0.25 mg total) by mouth 2 (two) times daily as needed for anxiety.   CABOTEGRAVIR  & RILPIVIRINE  ER (CABENUVA ) 600 & 900 MG/3ML INJECTION    Inject 1 kit into the muscle every 2 (two) months.   DRONABINOL  (MARINOL ) 5 MG CAPSULE    TAKE ONE CAPSULE BY MOUTH TWICE DAILY BEFORE A MEAL   TADALAFIL (CIALIS) 20 MG TABLET       VALACYCLOVIR  (VALTREX ) 500 MG TABLET    Take 1 tablet (500 mg total) by mouth daily.  Modified Medications   No medications on file  Discontinued Medications   No medications on file    Allergies: Allergies  Allergen Reactions   Restoril  [Temazepam ] Other (See Comments)    Violent nightmares    Past Medical History: Past Medical History:  Diagnosis Date   Acquired immune deficiency syndrome (HCC)    Genital herpes    HIV (human immunodeficiency virus infection) (HCC)    Hypertension    PCP (pneumocystis carinii pneumonia) (HCC)     Social History: Social History    Socioeconomic History   Marital status: Married    Spouse name: Not on file   Number of children: Not on file   Years of education: Not on file   Highest education level: Not on file  Occupational History   Not on file  Tobacco Use   Smoking status: Former    Current packs/day: 1.00    Average packs/day: 1 pack/day for 9.7 years (9.7 ttl pk-yrs)    Types: Cigarettes    Start date: 06/02/2014   Smokeless tobacco: Never  Vaping Use   Vaping status: Every Day  Substance and Sexual Activity   Alcohol use: Yes    Alcohol/week: 6.0 standard drinks of alcohol    Types: 6 Cans of beer per week    Comment: occ   Drug use: Yes    Types: Marijuana   Sexual activity: Yes    Partners: Female    Comment: declined condoms  Other Topics Concern   Not on file  Social History Narrative   Not on file   Social Drivers of Health   Financial Resource Strain: Not on file  Food Insecurity: Not on file  Transportation Needs: Not on file  Physical Activity: Not on file  Stress: Not on file  Social Connections: Not on file    Labs: Lab Results  Component Value Date   HIV1RNAQUANT NOT DETECTED 10/08/2023   HIV1RNAQUANT  Not Detected 04/02/2023   HIV1RNAQUANT Not Detected 10/09/2022   CD4TABS 208 (L) 10/08/2023   CD4TABS 203 (L) 04/02/2023   CD4TABS 257 (L) 10/09/2022    RPR and STI Lab Results  Component Value Date   LABRPR NON-REACTIVE 10/09/2022   LABRPR NON-REACTIVE 04/04/2022   LABRPR NON-REACTIVE 10/27/2021   LABRPR REACTIVE (A) 08/02/2021   LABRPR REACTIVE (A) 05/03/2021   RPRTITER 1:1 (H) 08/02/2021   RPRTITER 1:1 (H) 05/03/2021   RPRTITER 1:16 (H) 07/27/2020    STI Results GC CT  10/27/2021  3:44 PM Negative  Negative   05/03/2021  4:16 PM Negative  Negative   07/27/2020  9:23 AM Negative  Negative   08/13/2019 12:00 AM Negative  Negative   10/29/2018 12:00 AM Negative  Negative   07/30/2017 12:00 AM Negative  Negative   01/24/2017 12:00 AM Negative  Negative    08/09/2016 12:00 AM Negative  Negative   01/20/2016 12:00 AM Negative  Negative   08/23/2015 12:00 AM Negative  Negative   08/25/2014 12:00 AM NG: Negative  CT: Negative     Hepatitis B Lab Results  Component Value Date   HEPBSAB NON-REACTIVE 02/07/2017   HEPBSAG NEGATIVE 08/25/2014   Hepatitis C No results found for: HEPCAB, HCVRNAPCRQN Hepatitis A Lab Results  Component Value Date   HAV NON-REACTIVE 04/04/2022   Lipids: Lab Results  Component Value Date   CHOL 174 04/02/2023   TRIG 282 (H) 04/02/2023   HDL 56 04/02/2023   CHOLHDL 3.1 04/02/2023   VLDL 32 (H) 01/24/2017   LDLCALC 81 04/02/2023    TARGET DATE:  The 11th of the month  Assessment: Mills presents today for their maintenance Cabenuva  injections. Initial/past injections were tolerated well without issues. No problems with systemic effects of injections.   Administered cabotegravir  600mg /70mL in left upper outer quadrant of the gluteal muscle. Administered rilpivirine  900 mg/3mL in the right upper outer quadrant of the gluteal muscle. Monitored patient for 10 minutes after injection. Injections were tolerated well without issue. Patient will follow up in 2 months for next injection. Will defer HIV RNA until next visit with Cathlyn.   States he started doxycycline  and amoxicillin  for an arm infection and requested Diflucan  150 mg x 1 for possible yeast infection. States he has not developed symptoms yet but would like to have on hand in case he does since he typically presents with this after taking antibiotics.   Discussed eligibility for multiple vaccines including HAV, HBV, Menveo, and Shingles, but he politely declines all.   Plan: - Cabenuva  injections administered - Prescribe Diflucan  150 mg x 1 for possible yeast infection  - Next injections scheduled for 10/6 with Cathlyn and 12/9 with me  - Call with any issues or questions  Alan Geralds, PharmD, CPP, BCIDP, AAHIVP Clinical Pharmacist  Practitioner Infectious Diseases Clinical Pharmacist [facility]

## 2024-03-31 ENCOUNTER — Other Ambulatory Visit: Payer: Self-pay

## 2024-03-31 ENCOUNTER — Encounter: Payer: Self-pay | Admitting: Family

## 2024-03-31 ENCOUNTER — Ambulatory Visit: Admitting: Family

## 2024-03-31 VITALS — BP 120/74 | HR 91 | Temp 98.4°F | Wt 143.0 lb

## 2024-03-31 DIAGNOSIS — Z79899 Other long term (current) drug therapy: Secondary | ICD-10-CM

## 2024-03-31 DIAGNOSIS — Z0000 Encounter for general adult medical examination without abnormal findings: Secondary | ICD-10-CM

## 2024-03-31 DIAGNOSIS — B20 Human immunodeficiency virus [HIV] disease: Secondary | ICD-10-CM | POA: Diagnosis not present

## 2024-03-31 DIAGNOSIS — Z9189 Other specified personal risk factors, not elsewhere classified: Secondary | ICD-10-CM | POA: Diagnosis not present

## 2024-03-31 MED ORDER — CABOTEGRAVIR & RILPIVIRINE ER 600 & 900 MG/3ML IM SUER
1.0000 | Freq: Once | INTRAMUSCULAR | Status: AC
  Administered 2024-03-31: 1 via INTRAMUSCULAR

## 2024-03-31 NOTE — Assessment & Plan Note (Signed)
 History Casey Hurley continues to have well-controlled virus and good adherence and tolerance to Cabenuva .  Reviewed previous lab work and discussed plan of care and U equals U.  Covered by [facility].  Social determinants of health reviewed with no interventions indicated.  Check blood work.  Injection of Cabenuva  provided with no complications.  Plan for follow-up in 6 months or sooner if needed and with pharmacy providers in between.

## 2024-03-31 NOTE — Progress Notes (Signed)
 Brief Narrative   Patient ID: Casey Hurley, male    DOB: 03/14/1971, 54 y.o.   MRN: [id number]  Casey Hurley is a 53 y/o caucasian male with HIV disease diagnosed in February 2016 with risk factor of heterosexual contact. Initial viral load was 80,520 with CD4 count 20. Entered care at [facility] Stage 3. History of pneumocystis pneumonia with no other opportunistic infection. [id number] negative. ART experienced with Genvoya  and now Cabenuva .   Subjective:   Chief Complaint  Patient presents with   Follow-up    B20    HPI:  Casey Hurley is a 53 y.o. male with HIV disease last seen on 10/08/2023 with well-controlled virus and good adherence and tolerance to Cabenuva . Last injection was 02/05/24 with target date the 11th of the month.  Last lab work on 10/08/2023 with undetectable viral load and CD4 count 208.  Kidney function, liver function, electrolytes within normal ranges.  Here today for follow-up and next injection.  Casey Hurley has been doing well since his last office visit and continues to receive Cabenuva  as prescribed with no adverse side effects.  Covered by [facility].  No new concerns/complaints.  Recently took a trip to [place] [employer]  Housing, transportation, and access to food are stable.  Healthcare maintenance reviewed.  Sexually active in monogamous relationship with his wife  Dental care and colonoscopy up-to-date.  Denies fevers, chills, night sweats, headaches, changes in vision, neck pain/stiffness, nausea, diarrhea, vomiting, lesions or rashes.  Lab Results  Component Value Date   CD4TCELL 14 (L) 10/08/2023   CD4TABS 208 (L) 10/08/2023   Lab Results  Component Value Date   HIV1RNAQUANT NOT DETECTED 10/08/2023     Allergies  Allergen Reactions   Restoril  [Temazepam ] Other (See Comments)    Violent nightmares      Outpatient Medications Prior to Visit  Medication Sig Dispense Refill   ALPRAZolam  (XANAX ) 0.25 MG tablet Take 1 tablet (0.25 mg  total) by mouth 2 (two) times daily as needed for anxiety. 10 tablet 0   cabotegravir  & rilpivirine  ER (CABENUVA ) 600 & 900 MG/3ML injection Inject 1 kit into the muscle every 2 (two) months. 6 mL 5   dronabinol  (MARINOL ) 5 MG capsule TAKE ONE CAPSULE BY MOUTH TWICE DAILY BEFORE A MEAL 180 capsule 0   tadalafil (CIALIS) 20 MG tablet      valACYclovir  (VALTREX ) 500 MG tablet Take 1 tablet (500 mg total) by mouth daily. 90 tablet 2   albuterol  (VENTOLIN  HFA) 108 (90 Base) MCG/ACT inhaler  (Patient not taking: Reported on 03/31/2024)     fluconazole  (DIFLUCAN ) 150 MG tablet Take 1 tablet (150 mg total) by mouth daily. (Patient not taking: Reported on 03/31/2024) 1 tablet 0   No facility-administered medications prior to visit.     Past Medical History:  Diagnosis Date   Acquired immune deficiency syndrome (HCC)    Genital herpes    HIV (human immunodeficiency virus infection) (HCC)    Hypertension    PCP (pneumocystis carinii pneumonia) (HCC)      Past Surgical History:  Procedure Laterality Date   ANAL EXAMINATION UNDER ANESTHESIA     anal condylomas   VIDEO BRONCHOSCOPY Bilateral 08/25/2014   Procedure: VIDEO BRONCHOSCOPY WITHOUT FLUORO;  Surgeon: Lamar GORMAN Chris, MD;  Location: [facility];  Service: Cardiopulmonary;  Laterality: Bilateral;        Review of Systems  Constitutional:  Negative for appetite change, chills, fatigue, fever and unexpected weight change.  Eyes:  Negative for visual disturbance.  Respiratory:  Negative for cough, chest tightness, shortness of breath and wheezing.   Cardiovascular:  Negative for chest pain and leg swelling.  Gastrointestinal:  Negative for abdominal pain, constipation, diarrhea, nausea and vomiting.  Genitourinary:  Negative for dysuria, flank pain, frequency, genital sores, hematuria and urgency.  Skin:  Negative for rash.  Allergic/Immunologic: Negative for immunocompromised state.  Neurological:  Negative for dizziness and headaches.      Objective:   BP 120/74   Pulse 91   Temp 98.4 F (36.9 C) (Oral)   Wt 143 lb (64.9 kg)   SpO2 98%   BMI 20.23 kg/m  Nursing note and vital signs reviewed.  Physical Exam Constitutional:      General: He is not in acute distress.    Appearance: He is well-developed.  Eyes:     Conjunctiva/sclera: Conjunctivae normal.  Cardiovascular:     Rate and Rhythm: Normal rate and regular rhythm.     Heart sounds: Normal heart sounds. No murmur heard.    No friction rub. No gallop.  Pulmonary:     Effort: Pulmonary effort is normal. No respiratory distress.     Breath sounds: Normal breath sounds. No wheezing or rales.  Chest:     Chest wall: No tenderness.  Abdominal:     General: Bowel sounds are normal.     Palpations: Abdomen is soft.     Tenderness: There is no abdominal tenderness.  Musculoskeletal:     Cervical back: Neck supple.  Lymphadenopathy:     Cervical: No cervical adenopathy.  Skin:    General: Skin is warm and dry.     Findings: No rash.  Neurological:     Mental Status: He is alert and oriented to person, place, and time.  Psychiatric:        Behavior: Behavior normal.        Thought Content: Thought content normal.        Judgment: Judgment normal.          03/31/2024    3:51 PM 10/08/2023    3:48 PM 04/02/2023    3:51 PM 10/09/2022    4:02 PM 01/31/2022    3:57 PM  Depression screen PHQ 2/9  Decreased Interest 0 0 0 0 0  Down, Depressed, Hopeless 0 0 0 0 0  PHQ - 2 Score 0 0 0 0 0  Altered sleeping 0      Tired, decreased energy 0      Change in appetite 0      Feeling bad or failure about yourself  0      Trouble concentrating 0      Moving slowly or fidgety/restless 0      Suicidal thoughts 0      PHQ-9 Score 0      Difficult doing work/chores Not difficult at all            03/31/2024    3:51 PM 10/08/2023    3:48 PM  GAD 7 : Generalized Anxiety Score  Nervous, Anxious, on Edge 0 0  Control/stop worrying 0   Worry too much -  different things 0   Trouble relaxing 0   Restless 0   Easily annoyed or irritable 0   Afraid - awful might happen 0   Total GAD 7 Score 0   Anxiety Difficulty Not difficult at all      The 10-year ASCVD risk score (Arnett DK, et al., 2019) is: 2.3%  Values used to calculate the score:     Age: 58 years     Clincally relevant sex: Male     Is Non-Hispanic African American: No     Diabetic: No     Tobacco smoker: No     Systolic Blood Pressure: 120 mmHg     Is BP treated: No     HDL Cholesterol: 56 mg/dL     Total Cholesterol: 174 mg/dL      Assessment & Plan:    Patient Active Problem List   Diagnosis Date Noted   At increased risk for cardiovascular disease 04/02/2023   Healthcare maintenance 01/31/2022   Insomnia 01/16/2019   Anxiety 01/16/2019   Impotence due to erectile dysfunction 11/12/2018   Alcoholism (HCC) 01/31/2016   Tobacco use disorder 02/22/2015   Molluscum contagiosum infection 10/22/2014   Hyperglycemia 09/11/2014   AIDS (HCC) 08/25/2014   Tachycardia    Essential hypertension 08/24/2014   Genital herpes 08/24/2014   Recurrent pneumonia 08/23/2014   Post-operative state 12/11/2013   Anal condyloma 11/10/2013   Breast abscess in male 07/08/2012     Problem List Items Addressed This Visit       Other   AIDS (HCC) - Primary   History Barren continues to have well-controlled virus and good adherence and tolerance to Cabenuva .  Reviewed previous lab work and discussed plan of care and U equals U.  Covered by [facility].  Social determinants of health reviewed with no interventions indicated.  Check blood work.  Injection of Cabenuva  provided with no complications.  Plan for follow-up in 6 months or sooner if needed and with pharmacy providers in between.      Relevant Orders   Comprehensive metabolic panel with GFR   HIV-1 RNA quant-no reflex-bld   T-helper cell (CD4)- ([facility] clinic only)   Healthcare maintenance   Discussed importance  of safe sexual practice and condom use. Condoms and site specific STD testing offered.  Vaccinations reviewed and following counseling declined Dental care up-to-date Colon cancer screening up-to-date.      At increased risk for cardiovascular disease   Mr. Toft updated ASCVD risk score is 5%.  Discussed recommendations for statin medication to reduce risk of cardiovascular disease and HIV associated inflammation.  Will continue to monitor.      Other Visit Diagnoses       Pharmacologic therapy       Relevant Orders   Lipid panel        I am having Morene CHARLENA Janit maintain his albuterol , ALPRAZolam , dronabinol , tadalafil, cabotegravir  & rilpivirine  ER, valACYclovir , and fluconazole . We administered cabotegravir  & rilpivirine  ER.   Meds ordered this encounter  Medications   cabotegravir  & rilpivirine  ER (CABENUVA ) 600 & 900 MG/3ML injection 1 kit     Follow-up: Return in about 6 months (around 09/29/2024). or sooner if needed.    Cathlyn July, MSN, FNP-C Nurse Practitioner [facility] Main number: [phone]

## 2024-03-31 NOTE — Assessment & Plan Note (Signed)
 Discussed importance of safe sexual practice and condom use. Condoms and site specific STD testing offered.  Vaccinations reviewed and following counseling declined Dental care up-to-date Colon cancer screening up-to-date.

## 2024-03-31 NOTE — Patient Instructions (Signed)
 Nice to see you.  We will check your lab work today.  Continue to take your medication daily as prescribed.  Plan for follow up in 6 months or sooner if needed with lab work on the same day and with Pharmacy Provider in between. .  Have a great day and stay safe!

## 2024-03-31 NOTE — Assessment & Plan Note (Signed)
 Mr. Rico updated ASCVD risk score is 5%.  Discussed recommendations for statin medication to reduce risk of cardiovascular disease and HIV associated inflammation.  Will continue to monitor.

## 2024-04-01 LAB — T-HELPER CELL (CD4) - (RCID CLINIC ONLY)
CD4 % Helper T Cell: 15 % — ABNORMAL LOW (ref 33–65)
CD4 T Cell Abs: 191 /uL — ABNORMAL LOW (ref 400–1790)

## 2024-04-02 LAB — LIPID PANEL
Cholesterol: 189 mg/dL (ref <200)
HDL: 56 mg/dL (ref >=40)
LDL Cholesterol (Calc): 99 mg/dL
Non-HDL Cholesterol (Calc): 133 mg/dL — ABNORMAL HIGH (ref <130)
Total CHOL/HDL Ratio: 3.4 (calc) (ref <5.0)
Triglycerides: 225 mg/dL — ABNORMAL HIGH (ref <150)

## 2024-04-02 LAB — COMPREHENSIVE METABOLIC PANEL WITH GFR
AG Ratio: 1.8 (calc) (ref 1.0–2.5)
ALT: 12 U/L (ref 9–46)
AST: 15 U/L (ref 10–35)
Albumin: 4.7 g/dL (ref 3.6–5.1)
Alkaline phosphatase (APISO): 75 U/L (ref 35–144)
BUN: 17 mg/dL (ref 7–25)
CO2: 29 mmol/L (ref 20–32)
Calcium: 9.8 mg/dL (ref 8.6–10.3)
Chloride: 101 mmol/L (ref 98–110)
Creat: 1.24 mg/dL (ref 0.70–1.30)
Globulin: 2.6 g/dL (ref 1.9–3.7)
Glucose, Bld: 77 mg/dL (ref 65–99)
Potassium: 4.3 mmol/L (ref 3.5–5.3)
Sodium: 138 mmol/L (ref 135–146)
Total Bilirubin: 0.8 mg/dL (ref 0.2–1.2)
Total Protein: 7.3 g/dL (ref 6.1–8.1)
eGFR: 71 mL/min/1.73m2 (ref >=60)

## 2024-04-02 LAB — HIV-1 RNA QUANT-NO REFLEX-BLD
HIV 1 RNA Quant: NOT DETECTED {copies}/mL
HIV-1 RNA Quant, Log: NOT DETECTED {Log_copies}/mL

## 2024-04-03 ENCOUNTER — Ambulatory Visit: Payer: Self-pay | Admitting: Family

## 2024-05-02 DIAGNOSIS — Z125 Encounter for screening for malignant neoplasm of prostate: Secondary | ICD-10-CM | POA: Diagnosis not present

## 2024-05-02 DIAGNOSIS — Z21 Asymptomatic human immunodeficiency virus [HIV] infection status: Secondary | ICD-10-CM | POA: Diagnosis not present

## 2024-05-02 DIAGNOSIS — Z13220 Encounter for screening for lipoid disorders: Secondary | ICD-10-CM | POA: Diagnosis not present

## 2024-05-02 DIAGNOSIS — Z131 Encounter for screening for diabetes mellitus: Secondary | ICD-10-CM | POA: Diagnosis not present

## 2024-05-06 DIAGNOSIS — Z21 Asymptomatic human immunodeficiency virus [HIV] infection status: Secondary | ICD-10-CM | POA: Diagnosis not present

## 2024-05-09 ENCOUNTER — Encounter: Payer: Self-pay | Admitting: *Deleted

## 2024-05-21 ENCOUNTER — Other Ambulatory Visit (HOSPITAL_COMMUNITY): Payer: Self-pay

## 2024-05-21 ENCOUNTER — Telehealth: Payer: Self-pay

## 2024-05-21 NOTE — Telephone Encounter (Signed)
 Submitted a Prior Authorization request to [employer] for Cabenuva (Medical Benefits) via Phone. Will update once we receive a response.  J Code:[id number]  PA ID: [id number]  Fax chart notes and labs to [phone]  Phone # [phone]

## 2024-05-27 ENCOUNTER — Ambulatory Visit: Admitting: Infectious Diseases

## 2024-05-27 VITALS — BP 130/83 | HR 85 | Temp 98.5°F | Ht 70.0 in | Wt 150.2 lb

## 2024-05-27 DIAGNOSIS — E785 Hyperlipidemia, unspecified: Secondary | ICD-10-CM | POA: Insufficient documentation

## 2024-05-27 DIAGNOSIS — B20 Human immunodeficiency virus [HIV] disease: Secondary | ICD-10-CM | POA: Diagnosis not present

## 2024-05-27 DIAGNOSIS — Z79899 Other long term (current) drug therapy: Secondary | ICD-10-CM

## 2024-05-27 DIAGNOSIS — F32A Depression, unspecified: Secondary | ICD-10-CM | POA: Diagnosis not present

## 2024-05-27 MED ORDER — SERTRALINE HCL 25 MG PO TABS: 25.0000 mg | ORAL_TABLET | Freq: Every day | ORAL | 2 refills | Status: DC

## 2024-05-27 NOTE — Assessment & Plan Note (Signed)
 Will ask pharm if he can get his cabaneuva here at his request.  Will change him off cab if he wants , he is concerned that it is causing his depression (is listed in sfx profile).  His next injection is due 06-03-24.  Will continue for now.  Start SSRI.  Rtc in 6-8 weeks.

## 2024-05-27 NOTE — Progress Notes (Signed)
 Subjective:    Patient ID: Casey Hurley, male  DOB: Feb 10, 1971, 53 y.o.        MRN: [id number]   HPI 53 yo M dx AIDS/PCP when adm to [facility] 2-28 to 08-28-14. Was started on genvoya . His father died of lung cancer 06-23-20.    At visit 06-23-2021 c/o diffuse rash. 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   He was changed to cabaneuva 04-2021.  Doing well.  No complaints today. Here with wife.   Quit smoking 2 years ago, now vaping.  Has seen Dr Leonel is going to lung cancer screening.   Has been feeling depressed (lost interest in everything. His boat has not left the driveway for 2 years).  Sleeping ok.  Taking gummy 1/week.  He and his wife, sex life is stable.  Wt stable (~140) No SI, no HI.  Appetite is stable. No prev SSRI use.  1 drink/night  HIV 1 RNA Quant  Date Value  03/31/2024 NOT DETECTED copies/mL  10/08/2023 NOT DETECTED copies/mL  04/02/2023 Not Detected Copies/mL   CD4 T Cell Abs (/uL)  Date Value  03/31/2024 191 (L)  10/08/2023 208 (L)  04/02/2023 203 (L)     Health Maintenance  Topic Date Due  . Zoster Vaccines- Shingrix (1 of 2) Never done  . COVID-19 Vaccine (3 - Pfizer risk series) 03/10/2020  . Influenza Vaccine  01/25/2024  . DTaP/Tdap/Td (3 - Td or Tdap) 11/11/2028  . Colonoscopy  11/30/2032  . Pneumococcal Vaccine: 50+ Years  Completed  . Hepatitis B Vaccines 19-59 Average Risk  Completed  . Hepatitis C Screening  Completed  . HIV Screening  Completed  . HPV VACCINES  Aged Out  . Meningococcal B Vaccine  Aged Out      Review of Systems  Constitutional:  Negative for chills, fever and weight loss.  Respiratory:  Negative for cough and shortness of breath.   Gastrointestinal:  Negative for constipation and diarrhea.  Genitourinary:  Negative for dysuria.  Neurological:  Negative for headaches.  Psychiatric/Behavioral:  Positive for depression.     Please see HPI. All other systems reviewed and negative.     Objective:   Physical Exam Vitals reviewed.  Constitutional:      General: He is not in acute distress.    Appearance: Normal appearance. He is not ill-appearing or toxic-appearing.  HENT:     Mouth/Throat:     Mouth: Mucous membranes are moist.     Pharynx: No oropharyngeal exudate.  Eyes:     Extraocular Movements: Extraocular movements intact.     Pupils: Pupils are equal, round, and reactive to light.  Cardiovascular:     Rate and Rhythm: Normal rate and regular rhythm.  Pulmonary:     Effort: Pulmonary effort is normal.     Breath sounds: Normal breath sounds.  Abdominal:     General: Bowel sounds are normal. There is no distension.     Palpations: Abdomen is soft.     Tenderness: There is no abdominal tenderness.  Musculoskeletal:        General: Normal range of motion.     Cervical back: Normal range of motion and neck supple.     Right lower leg: No edema.     Left lower leg: No edema.  Neurological:     General: No focal deficit present.     Mental Status: He is alert.  Psychiatric:        Mood and  Affect: Mood is depressed.        Behavior: Behavior is not agitated, aggressive or hyperactive.        Thought Content: Thought content does not include homicidal or suicidal ideation. Thought content does not include homicidal or suicidal plan.           Assessment & Plan:

## 2024-05-27 NOTE — Assessment & Plan Note (Signed)
 Discussed starting statin, he defers.  Has been < 100 and 107 on last 2 draws.

## 2024-05-27 NOTE — Assessment & Plan Note (Signed)
 Will start low dose zoloft Rtc in 2 months.   Ifnot improving re-consider stopping cab, and taper zoloft off Or- increase dose zoloft.

## 2024-05-28 ENCOUNTER — Telehealth: Payer: Self-pay

## 2024-05-28 ENCOUNTER — Other Ambulatory Visit (HOSPITAL_COMMUNITY): Payer: Self-pay

## 2024-05-28 NOTE — Telephone Encounter (Signed)
 Rec'd Cabenuva  request from Dr. Eben.   Per test claim:  DRUG EXCLUDED UNDER PHARMACY BENEFIT. REFER TO PHARMACY TO BILL MEDICAL BENEFIT - NO PRESCRIPTION DRUG BENEFIT.   Patient will need to have medication billed to his medical benefit. Medication will need to be given at Infusion Center.

## 2024-05-29 ENCOUNTER — Other Ambulatory Visit (HOSPITAL_COMMUNITY): Payer: Self-pay | Admitting: Pharmacy Technician

## 2024-05-29 DIAGNOSIS — Z21 Asymptomatic human immunodeficiency virus [HIV] infection status: Secondary | ICD-10-CM | POA: Insufficient documentation

## 2024-05-30 ENCOUNTER — Encounter: Payer: Self-pay | Admitting: Pharmacist

## 2024-05-30 NOTE — Progress Notes (Unsigned)
 Spoke with Casey Hurley via teams who stated patient will keep appointment at [facility] for next Cabenuva . States if anything changes, [facility] infusion center is happy to schedule him in February.  Alan Geralds, PharmD, CPP, BCIDP, AAHIVP Clinical Pharmacist Practitioner Infectious Diseases Clinical Pharmacist [facility] Infect[facility]

## 2024-06-02 ENCOUNTER — Telehealth: Payer: Self-pay

## 2024-06-02 NOTE — Progress Notes (Unsigned)
 HPI: Casey Hurley is a 53 y.o. male who presents to the [facility] pharmacy clinic for Cabenuva  administration.  Referring ID Physician: Cathlyn July, NP   Patient Active Problem List   Diagnosis Date Noted   Asymptomatic human immunodeficiency virus (HIV) infection status (HCC) 05/29/2024   Depression 05/27/2024   Hyperlipidemia LDL goal <100 05/27/2024   At increased risk for cardiovascular disease 04/02/2023   Healthcare maintenance 01/31/2022   Insomnia 01/16/2019   Anxiety 01/16/2019   Impotence due to erectile dysfunction 11/12/2018   Alcoholism (HCC) 01/31/2016   Tobacco use disorder 02/22/2015   Molluscum contagiosum infection 10/22/2014   Hyperglycemia 09/11/2014   AIDS (HCC) 08/25/2014   Tachycardia    Essential hypertension 08/24/2014   Genital herpes 08/24/2014   Recurrent pneumonia 08/23/2014   Post-operative state 12/11/2013   Anal condyloma 11/10/2013   Breast abscess in male 07/08/2012    Patient's Medications  New Prescriptions   No medications on file  Previous Medications   CABOTEGRAVIR  & RILPIVIRINE  ER (CABENUVA ) 600 & 900 MG/3ML INJECTION    Inject 1 kit into the muscle every 2 (two) months.   SERTRALINE  (ZOLOFT ) 25 MG TABLET    Take 1 tablet (25 mg total) by mouth daily.   TADALAFIL (CIALIS) 20 MG TABLET       VALACYCLOVIR  (VALTREX ) 500 MG TABLET    Take 1 tablet (500 mg total) by mouth daily.  Modified Medications   No medications on file  Discontinued Medications   No medications on file    Allergies: Allergies  Allergen Reactions   Restoril  Cornelia.cooper ] Other (See Comments)    Violent nightmares    Past Medical History: Past Medical History:  Diagnosis Date   Acquired immune deficiency syndrome (HCC)    Genital herpes    HIV (human immunodeficiency virus infection) (HCC)    Hypertension    PCP (pneumocystis carinii pneumonia) (HCC)     Social History: Social History   Socioeconomic History   Marital status: Married    Spouse  name: Not on file   Number of children: Not on file   Years of education: Not on file   Highest education level: Not on file  Occupational History   Not on file  Tobacco Use   Smoking status: Former    Types: Cigarettes   Smokeless tobacco: Never  Vaping Use   Vaping status: Every Day  Substance and Sexual Activity   Alcohol use: Yes    Alcohol/week: 6.0 standard drinks of alcohol    Types: 6 Cans of beer per week    Comment: occ   Drug use: Yes    Types: Marijuana   Sexual activity: Yes    Partners: Female    Comment: declined condoms  Other Topics Concern   Not on file  Social History Narrative   Not on file   Social Drivers of Health   Financial Resource Strain: Not on file  Food Insecurity: Not on file  Transportation Needs: Not on file  Physical Activity: Not on file  Stress: Not on file  Social Connections: Not on file    Labs: Lab Results  Component Value Date   HIV1RNAQUANT NOT DETECTED 03/31/2024   HIV1RNAQUANT NOT DETECTED 10/08/2023   HIV1RNAQUANT Not Detected 04/02/2023   CD4TABS 191 (L) 03/31/2024   CD4TABS 208 (L) 10/08/2023   CD4TABS 203 (L) 04/02/2023    RPR and STI Lab Results  Component Value Date   LABRPR NON-REACTIVE 10/09/2022   LABRPR NON-REACTIVE 04/04/2022  LABRPR NON-REACTIVE 10/27/2021   LABRPR REACTIVE (A) 08/02/2021   LABRPR REACTIVE (A) 05/03/2021   RPRTITER 1:1 (H) 08/02/2021   RPRTITER 1:1 (H) 05/03/2021   RPRTITER 1:16 (H) 07/27/2020    STI Results GC CT  10/27/2021  3:44 PM Negative  Negative   05/03/2021  4:16 PM Negative  Negative   07/27/2020  9:23 AM Negative  Negative   08/13/2019 12:00 AM Negative  Negative   10/29/2018 12:00 AM Negative  Negative   07/30/2017 12:00 AM Negative  Negative   01/24/2017 12:00 AM Negative  Negative   08/09/2016 12:00 AM Negative  Negative   01/20/2016 12:00 AM Negative  Negative   08/23/2015 12:00 AM Negative  Negative   08/25/2014 12:00 AM NG: Negative  CT: Negative      Hepatitis B Lab Results  Component Value Date   HEPBSAB NON-REACTIVE 02/07/2017   HEPBSAG NEGATIVE 08/25/2014   Hepatitis C No results found for: HEPCAB, HCVRNAPCRQN Hepatitis A Lab Results  Component Value Date   HAV NON-REACTIVE 04/04/2022   Lipids: Lab Results  Component Value Date   CHOL 189 03/31/2024   TRIG 225 (H) 03/31/2024   HDL 56 03/31/2024   CHOLHDL 3.4 03/31/2024   VLDL 32 (H) 01/24/2017   LDLCALC 99 03/31/2024    TARGET DATE:  The 11th of the month  Assessment: Casey Hurley presents today for their maintenance Cabenuva  injections. Initial/past injections were tolerated well without issues. Stated he has experienced increased depression lately which he is concerned could be linked to Cabenuva . Would like to continue injections at this time but would consider switching back to pills should his depressive symptoms continue to worsen. Also, patient would like to continue injections through [facility] at this time given our process is set for covering medical benefits. If he changes his mind in the future and/or the infusion center is able to seek approval through medical benefits, he could transition to receiving injections with them.    Administered cabotegravir  600mg /74mL in left upper outer quadrant of the gluteal muscle. Administered rilpivirine  900 mg/3mL in the right upper outer quadrant of the gluteal muscle. Monitored patient for 10 minutes after injection. Injections were tolerated well without issue. Patient will follow up in 2 months for next injection. Due for CBC at next blood draw.  Remains eligible for multiple vaccinations which he continues to decline.  Plan: - Administer Cabenuva  injections  - Next injections scheduled for 08/06/24 with me  - Call with any issues or questions  Alan Geralds, PharmD, CPP, BCIDP, AAHIVP Clinical Pharmacist Practitioner Infectious Diseases Clinical Pharmacist [facility]

## 2024-06-02 NOTE — Telephone Encounter (Signed)
 Pharmacy Patient Advocate Encounter- Cabenuva  BIV-Medical Benefit:  J code: [id number]  CPT code: 03627  Dx Code: B20  PA was submitted to [facility] and has been approved through: 05/21/24-05/20/25 Authorization# [id number]

## 2024-06-03 ENCOUNTER — Other Ambulatory Visit: Payer: Self-pay

## 2024-06-03 ENCOUNTER — Ambulatory Visit: Payer: Self-pay | Admitting: Pharmacist

## 2024-06-03 DIAGNOSIS — B20 Human immunodeficiency virus [HIV] disease: Secondary | ICD-10-CM | POA: Diagnosis not present

## 2024-06-03 MED ORDER — CABOTEGRAVIR & RILPIVIRINE ER 600 & 900 MG/3ML IM SUER
1.0000 | Freq: Once | INTRAMUSCULAR | Status: AC
  Administered 2024-06-03: 1 via INTRAMUSCULAR

## 2024-06-10 ENCOUNTER — Other Ambulatory Visit: Payer: Self-pay | Admitting: *Deleted

## 2024-06-10 ENCOUNTER — Telehealth: Payer: Self-pay | Admitting: *Deleted

## 2024-06-10 DIAGNOSIS — Z87891 Personal history of nicotine dependence: Secondary | ICD-10-CM

## 2024-06-10 DIAGNOSIS — Z122 Encounter for screening for malignant neoplasm of respiratory organs: Secondary | ICD-10-CM

## 2024-06-10 NOTE — Telephone Encounter (Signed)
 Lung Cancer Screening Narrative/Criteria Questionnaire (Cigarette Smokers Only- No Cigars/Pipes/vapes)   Casey Hurley   SDMV:06/13/24 2:30- [place]                                           Jan 14, 1971              LDCT: 06/17/24 4:00- GI    53 y.o.   Phone: [phone]  Lung Screening Narrative (confirm age 45-77 yrs Medicare / 50-80 yrs Private pay insurance)   Insurance information:BCBS   Referring Provider:Hammer   This screening involves an initial phone call with a team member from our program. It is called a shared decision making visit. The initial meeting is required by insurance and Medicare to make sure you understand the program. This appointment takes about 15-20 minutes to complete. The CT scan will completed at a separate date/time. This scan takes about 5-10 minutes to complete and you may eat and drink before and after the scan.  Criteria questions for Lung Cancer Screening:   Are you a current or former smoker? Former Age began smoking: 19   If you are a former smoker, what year did you quit smoking? 2 years ago(within 15 yrs)   To calculate your smoking history, I need an accurate estimate of how many packs of cigarettes you smoked per day and for how many years. (Not just the number of PPD you are now smoking)   Years smoking 32 x Packs per day 1-1.5 = Pack years 42   (at least 20 pack yrs)   (Make sure they understand that we need to know how much they have smoked in the past, not just the number of PPD they are smoking now)  Do you have a personal history of cancer?  No    Do you have a family history of cancer? Yes  (cancer type and and relative) Father (lung, prostate) GF (lung)  Are you coughing up blood?  No  Have you had unexplained weight loss of 15 lbs or more in the last 6 months? No  It looks like you meet all criteria.     Additional information: N/A

## 2024-06-13 ENCOUNTER — Encounter

## 2024-06-13 DIAGNOSIS — Z87891 Personal history of nicotine dependence: Secondary | ICD-10-CM | POA: Diagnosis not present

## 2024-06-13 DIAGNOSIS — Z122 Encounter for screening for malignant neoplasm of respiratory organs: Secondary | ICD-10-CM

## 2024-06-13 NOTE — Patient Instructions (Addendum)
 Thank you for participating in the [facility]. It was our pleasure to meet you today. We will call you with the results of your scan within the next few days. Your scan will be assigned a Lung RADS category score by the physicians reading the scans.  This Lung RADS score determines follow up scanning.  See below for description of categories, and follow up screening recommendations. We will be in touch to schedule your follow up screening annually or based on recommendations of our providers. We will fax a copy of your scan results to your Primary Care Physician, or the physician who referred you to the program, to ensure they have the results. Please call the office if you have any questions or concerns regarding your scanning experience or results.  Our office number is [phone]. Please speak with Karna Curly, RN., Karna Doom RN, or [facility] RN, and Isaiah Dover RN. They are  our Lung Cancer Screening RN.'s If They are unavailable when you call, Please leave a message on the voice mail. We will return your call at our earliest convenience.This voice mail is monitored several times a day.  Remember, if your scan is normal, we will scan you annually as long as you continue to meet the criteria for the program. (Age 68-80, Current smoker or smoker who has quit within the last 15 years). If you are a smoker, remember, quitting is the single most powerful action that you can take to decrease your risk of lung cancer and other pulmonary, breathing related problems. We know quitting is hard, and we are here to help.  Please let us  know if there is anything we can do to help you meet your goal of quitting. If you are a former smoker, counselling psychologist. We are proud of you! Remain smoke free! Remember you can refer friends or family members through the number above.  We will screen them to make sure they meet criteria for the program. Thank you for helping us   take better care of you by participating in Lung Screening.  For Virtual Smoking Cessation Classes , The American Lung Association Provides  Freedom From Smoking Classes.  Please search their website for dates and times.   Lung RADS Categories:  Lung RADS 1: no nodules or definitely non-concerning nodules.  Recommendation is for a repeat annual scan in 12 months.  Lung RADS 2:  nodules that are non-concerning in appearance and behavior with a very low likelihood of becoming an active cancer. Recommendation is for a repeat annual scan in 12 months.  Lung RADS 3: nodules that are probably non-concerning , includes nodules with a low likelihood of becoming an active cancer.  Recommendation is for a 46-month repeat screening scan. Often noted after an upper respiratory illness. We will be in touch to make sure you have no questions, and to schedule your 43-month scan.  Lung RADS 4 A: nodules with concerning findings, recommendation is most often for a follow up scan in 3 months or additional testing based on our provider's assessment of the scan. We will be in touch to make sure you have no questions and to schedule the recommended 3 month follow up scan.  Lung RADS 4 B:  indicates findings that are concerning. We will be in touch with you to schedule additional diagnostic testing based on our provider's  assessment of the scan.  You can receive free nicotine  replacement therapy ( patches, gum or mints) by calling 1-800-QUIT NOW. Please  call so we can get you on the path to becoming  a non-smoker. I know it is hard, but you can do this!  Other options for assistance in smoking cessation ( As covered by your insurance benefits)  Hypnosis for smoking cessation  [employer] [phone] [phone]  Acupuncture for smoking cessation  [employer] (651)492-7589    Vaping Cessation Resources:  [employer]: styleproposal.co.za 1-800-QUIT-NOW also has resources  for quitting vaping.  Your CT scan is scheduled for 06/17/2024 at 4 pm at [facility].

## 2024-06-13 NOTE — Progress Notes (Signed)
 Virtual Visit via Telephone Note  I connected with Casey Hurley on 06/13/2024 at  2:30 PM EST by telephone and verified that I am speaking with the correct person using two identifiers.  Location: Patient: At home, in [place]  Provider: [address] [place], [place], [address]    I discussed the limitations, risks, security and privacy concerns of performing an evaluation and management service by telephone and the availability of in person appointments. I also discussed with the patient that there may be a patient responsible charge related to this service. The patient expressed understanding and agreed to proceed.  Shared Decision Making Visit Lung Cancer Screening Program [phone])   Eligibility: Age 53 y.o. Pack Years Smoking History Calculation 40 pack years  (# packs/per year x # years smoked) Recent History of coughing up blood  no Unexplained weight loss? no ( >Than 15 pounds within the last 6 months ) Prior History Lung / other cancer no (Diagnosis within the last 5 years already requiring surveillance chest CT Scans). Smoking Status Former Smoker Former Smokers: Years since quit: 2 years  Quit Date: 2023  Visit Components: Discussion included one or more decision making aids. yes Discussion included risk/benefits of screening. yes Discussion included potential follow up diagnostic testing for abnormal scans. yes Discussion included meaning and risk of over diagnosis. yes Discussion included meaning and risk of False Positives. yes Discussion included meaning of total radiation exposure. yes  Counseling Included: Importance of adherence to annual lung cancer LDCT screening. yes Impact of comorbidities on ability to participate in the program. yes Ability and willingness to under diagnostic treatment. yes  Smoking Cessation Counseling: Current Smokers:  Discussed importance of smoking cessation. N/A Information about tobacco cessation classes and interventions  provided to patient. N/A Patient provided with ticket for LDCT Scan. N/A Symptomatic Patient. N/A  Counseling Diagnosis Code: Tobacco Use Z72.0 Asymptomatic Patient N/A  Counseling  Former Smokers:  Discussed the importance of maintaining cigarette abstinence. yes Diagnosis Code: Personal History of Nicotine  Dependence. S12.108 Information about tobacco cessation classes and interventions provided to patient. Yes Patient provided with ticket for LDCT Scan. N/A Written Order for Lung Cancer Screening with LDCT placed in Epic. Yes (CT Chest Lung Cancer Screening Low Dose W/O CM) [id number] Z12.2-Screening of respiratory organs Z87.891-Personal history of nicotine  dependence  Shared decision visit completed by Wells Georgia, FNP as a registered nurse awaiting credentialing.    Wells CHRISTELLA Georgia, FNP

## 2024-06-17 ENCOUNTER — Ambulatory Visit
Admission: RE | Admit: 2024-06-17 | Discharge: 2024-06-17 | Disposition: A | Discharge status: Home / Self Care | Source: Ambulatory Visit | Attending: Acute Care | Admitting: Acute Care

## 2024-06-17 DIAGNOSIS — Z87891 Personal history of nicotine dependence: Secondary | ICD-10-CM

## 2024-06-17 DIAGNOSIS — Z122 Encounter for screening for malignant neoplasm of respiratory organs: Secondary | ICD-10-CM

## 2024-06-20 ENCOUNTER — Other Ambulatory Visit: Payer: Self-pay | Admitting: Infectious Diseases

## 2024-06-20 DIAGNOSIS — F32A Depression, unspecified: Secondary | ICD-10-CM

## 2024-06-30 ENCOUNTER — Other Ambulatory Visit: Payer: Self-pay

## 2024-06-30 DIAGNOSIS — Z122 Encounter for screening for malignant neoplasm of respiratory organs: Secondary | ICD-10-CM

## 2024-06-30 DIAGNOSIS — Z87891 Personal history of nicotine dependence: Secondary | ICD-10-CM

## 2024-07-29 ENCOUNTER — Ambulatory Visit: Admitting: Infectious Diseases

## 2024-07-29 VITALS — BP 148/85 | HR 86 | Temp 98.0°F | Ht 70.0 in | Wt 146.0 lb

## 2024-07-29 DIAGNOSIS — Z79899 Other long term (current) drug therapy: Secondary | ICD-10-CM

## 2024-07-29 DIAGNOSIS — F32A Depression, unspecified: Secondary | ICD-10-CM

## 2024-07-29 DIAGNOSIS — Z720 Tobacco use: Secondary | ICD-10-CM | POA: Diagnosis not present

## 2024-07-29 DIAGNOSIS — F17200 Nicotine dependence, unspecified, uncomplicated: Secondary | ICD-10-CM

## 2024-07-29 DIAGNOSIS — B20 Human immunodeficiency virus [HIV] disease: Secondary | ICD-10-CM | POA: Diagnosis not present

## 2024-07-29 DIAGNOSIS — Z9189 Other specified personal risk factors, not elsewhere classified: Secondary | ICD-10-CM

## 2024-07-29 DIAGNOSIS — Z7189 Other specified counseling: Secondary | ICD-10-CM

## 2024-07-29 DIAGNOSIS — Z113 Encounter for screening for infections with a predominantly sexual mode of transmission: Secondary | ICD-10-CM

## 2024-07-29 MED ORDER — SERTRALINE HCL 25 MG PO TABS: 25.0000 mg | ORAL_TABLET | Freq: Every day | ORAL | 3 refills | Status: AC

## 2024-07-29 NOTE — Progress Notes (Unsigned)
" ° °  Subjective:    Patient ID: Casey Hurley, male  DOB: [birth year], 54 y.o.        MRN: [id number]   HPI 54 yo M dx AIDS/PCP when adm to [facility] 2-28 to 08-28-14. Was started on genvoya . His father died of lung cancer 06/26/20.    At visit 2022 c/o diffuse rash. 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   He was changed to cabaneuva 04-2021.  He was started on SSRI (zoloft  25mg  qday) at his last visit due to depression.  He feels better today.  Mood improved.    Quit smoking 2 years ago, now vaping.  So close to quiting....  Lung cancer screening Jun 26, 2024 (normal). He has some worries that his Ct showed some CVD.    HIV 1 RNA Quant  Date Value  03/31/2024 NOT DETECTED copies/mL  10/08/2023 NOT DETECTED copies/mL  04/02/2023 Not Detected Copies/mL   CD4 T Cell Abs (/uL)  Date Value  03/31/2024 191 (L)  10/08/2023 208 (L)  04/02/2023 203 (L)     Health Maintenance  Topic Date Due   Zoster Vaccines- Shingrix (1 of 2) Never done   COVID-19 Vaccine (3 - Pfizer risk series) 03/10/2020   Influenza Vaccine  01/25/2024   DTaP/Tdap/Td (3 - Td or Tdap) 11/11/2028   Colonoscopy  11/30/2032   Pneumococcal Vaccine: 50+ Years  Completed   Hepatitis B Vaccines 19-59 Average Risk  Completed   HPV VACCINES (No Doses Required) Completed   Hepatitis C Screening  Completed   HIV Screening  Completed   Meningococcal B Vaccine  Aged Out      Review of Systems  Constitutional:  Negative for weight loss.  Respiratory:  Negative for cough and shortness of breath.   Cardiovascular:  Negative for chest pain and leg swelling.  Gastrointestinal:  Negative for constipation and diarrhea.  Genitourinary:  Negative for dysuria.    Please see HPI. All other systems reviewed and negative.     Objective:  Physical Exam Vitals reviewed.  Constitutional:      Appearance: Normal appearance.  HENT:     Mouth/Throat:     Mouth: Mucous membranes are moist.     Pharynx: No  oropharyngeal exudate.  Eyes:     Extraocular Movements: Extraocular movements intact.     Pupils: Pupils are equal, round, and reactive to light.  Cardiovascular:     Rate and Rhythm: Normal rate and regular rhythm.  Pulmonary:     Effort: Pulmonary effort is normal.     Breath sounds: Normal breath sounds.  Abdominal:     General: Abdomen is flat. There is no distension.     Palpations: Abdomen is soft.     Tenderness: There is no abdominal tenderness.  Musculoskeletal:        General: Normal range of motion.     Cervical back: Normal range of motion and neck supple.     Right lower leg: No edema.     Left lower leg: No edema.  Neurological:     Mental Status: He is alert.  Psychiatric:        Mood and Affect: Mood normal.           Assessment & Plan:  "

## 2024-07-29 NOTE — Assessment & Plan Note (Signed)
 He is doing well.  Encouraged him to quit.

## 2024-07-29 NOTE — Assessment & Plan Note (Signed)
 Will continue him on zoloft  Discussed pro-con of staying on /stopping.  Will aim for 6-12 months.  No side effects that he has noted.

## 2024-07-29 NOTE — Assessment & Plan Note (Signed)
 He is doing well Will see if we can get his cabaneuva today from [facility] Rtc in 4 months

## 2024-07-30 NOTE — Progress Notes (Unsigned)
 "  HPI: Casey Hurley is a 54 y.o. male who presents to the [facility] pharmacy clinic for Cabenuva  administration.  Referring ID Physician: Cathlyn July, NP   Patient Active Problem List   Diagnosis Date Noted   Asymptomatic human immunodeficiency virus (HIV) infection status (HCC) 05/29/2024   Depression 05/27/2024   Hyperlipidemia LDL goal <100 05/27/2024   At increased risk for cardiovascular disease 04/02/2023   Healthcare maintenance 01/31/2022   Insomnia 01/16/2019   Anxiety 01/16/2019   Impotence due to erectile dysfunction 11/12/2018   Alcoholism (HCC) 01/31/2016   Tobacco use disorder 02/22/2015   Molluscum contagiosum infection 10/22/2014   Hyperglycemia 09/11/2014   AIDS (HCC) 08/25/2014   Tachycardia    Essential hypertension 08/24/2014   Genital herpes 08/24/2014   Recurrent pneumonia 08/23/2014   Post-operative state 12/11/2013   Anal condyloma 11/10/2013   Breast abscess in male 07/08/2012    Patient's Medications  New Prescriptions   No medications on file  Previous Medications   CABOTEGRAVIR  & RILPIVIRINE  ER (CABENUVA ) 600 & 900 MG/3ML INJECTION    Inject 1 kit into the muscle every 2 (two) months.   SERTRALINE  (ZOLOFT ) 25 MG TABLET    Take 1 tablet (25 mg total) by mouth daily.   TADALAFIL (CIALIS) 20 MG TABLET       VALACYCLOVIR  (VALTREX ) 500 MG TABLET    Take 1 tablet (500 mg total) by mouth daily.  Modified Medications   No medications on file  Discontinued Medications   No medications on file    Allergies: Allergies[1]  Past Medical History: Past Medical History:  Diagnosis Date   Acquired immune deficiency syndrome (HCC)    Genital herpes    HIV (human immunodeficiency virus infection) (HCC)    Hypertension    PCP (pneumocystis carinii pneumonia) (HCC)     Social History: Social History   Socioeconomic History   Marital status: Married    Spouse name: Not on file   Number of children: Not on file   Years of education: Not on file    Highest education level: Not on file  Occupational History   Not on file  Tobacco Use   Smoking status: Former    Types: Cigarettes   Smokeless tobacco: Never  Vaping Use   Vaping status: Every Day  Substance and Sexual Activity   Alcohol use: Yes    Alcohol/week: 6.0 standard drinks of alcohol    Types: 6 Cans of beer per week    Comment: occ   Drug use: Yes    Types: Marijuana   Sexual activity: Yes    Partners: Female    Comment: declined condoms  Other Topics Concern   Not on file  Social History Narrative   Not on file   Social Drivers of Health   Tobacco Use: Medium Risk (06/13/2024)   Patient History    Smoking Tobacco Use: Former    Smokeless Tobacco Use: Never    Passive Exposure: Not on Actuary Strain: Not on file  Food Insecurity: Not on file  Transportation Needs: Not on file  Physical Activity: Not on file  Stress: Not on file  Social Connections: Not on file  Depression (PHQ2-9): Low Risk (07/29/2024)   Depression (PHQ2-9)    PHQ-2 Score: 2  Alcohol Screen: Not on file  Housing: Not on file  Utilities: Not on file  Health Literacy: Not on file    Labs: Lab Results  Component Value Date   HIV1RNAQUANT NOT  DETECTED 03/31/2024   HIV1RNAQUANT NOT DETECTED 10/08/2023   HIV1RNAQUANT Not Detected 04/02/2023   CD4TABS 191 (L) 03/31/2024   CD4TABS 208 (L) 10/08/2023   CD4TABS 203 (L) 04/02/2023    RPR and STI Lab Results  Component Value Date   LABRPR NON-REACTIVE 10/09/2022   LABRPR NON-REACTIVE 04/04/2022   LABRPR NON-REACTIVE 10/27/2021   LABRPR REACTIVE (A) 08/02/2021   LABRPR REACTIVE (A) 05/03/2021   RPRTITER 1:1 (H) 08/02/2021   RPRTITER 1:1 (H) 05/03/2021   RPRTITER 1:16 (H) 07/27/2020    STI Results GC CT  10/27/2021  3:44 PM Negative  Negative   05/03/2021  4:16 PM Negative  Negative   07/27/2020  9:23 AM Negative  Negative   08/13/2019 12:00 AM Negative  Negative   10/29/2018 12:00 AM Negative  Negative    07/30/2017 12:00 AM Negative  Negative   01/24/2017 12:00 AM Negative  Negative   08/09/2016 12:00 AM Negative  Negative   01/20/2016 12:00 AM Negative  Negative   08/23/2015 12:00 AM Negative  Negative   08/25/2014 12:00 AM NG: Negative  CT: Negative     Hepatitis B Lab Results  Component Value Date   HEPBSAB NON-REACTIVE 02/07/2017   HEPBSAG NEGATIVE 08/25/2014   Hepatitis C No results found for: HEPCAB, HCVRNAPCRQN Hepatitis A Lab Results  Component Value Date   HAV NON-REACTIVE 04/04/2022   Lipids: Lab Results  Component Value Date   CHOL 189 03/31/2024   TRIG 225 (H) 03/31/2024   HDL 56 03/31/2024   CHOLHDL 3.4 03/31/2024   VLDL 32 (H) 01/24/2017   LDLCALC 99 03/31/2024    TARGET DATE:  The 11th of the month  Assessment: Josephus presents today for their maintenance Cabenuva  injections. Initial/past injections were tolerated well without issues. No problems with systemic effects of injections. Patient has experienced worsening depression which he has correlated with Cabenuva . Dr. Eben started patient on Zoloft  in December, and patient states this has stabilized his mood at this time. Will continue monitoring mood and offered counseling services here as well.    Administered cabotegravir  600mg /46mL in left upper outer quadrant of the gluteal muscle. Administered rilpivirine  900 mg/3mL in the right upper outer quadrant of the gluteal muscle. Monitored patient for 10 minutes after injection. Injections were tolerated well without issue. Patient will follow up in 2 months for next injection. Will check HIV RNA and CBC at next office visit.  Eligible for multiple vaccinations which he continues to decline.   Plan: - Administer Cabenuva  injections - Next injections scheduled for *** with Cathlyn and *** with me  - Call with any issues or questions  Alan Geralds, PharmD, CPP, BCIDP, AAHIVP Clinical Pharmacist Practitioner Infectious Diseases Clinical  Pharmacist [facility]      [1]  Allergies Allergen Reactions   Restoril  [Temazepam ] Other (See Comments)    Violent nightmares   "

## 2024-08-05 ENCOUNTER — Ambulatory Visit: Admitting: Pharmacist

## 2024-08-06 ENCOUNTER — Ambulatory Visit: Admitting: Pharmacist

## 2024-08-06 DIAGNOSIS — B20 Human immunodeficiency virus [HIV] disease: Secondary | ICD-10-CM

## 2024-11-25 ENCOUNTER — Encounter: Payer: Self-pay | Admitting: Infectious Diseases
